# Patient Record
Sex: Female | Born: 1950 | Race: White | Hispanic: No | Smoking: Never smoker
Health system: Southern US, Community
[De-identification: ages and names within clinical notes are randomized; demographics above are authoritative.]

## PROBLEM LIST (undated history)

## (undated) DIAGNOSIS — E785 Hyperlipidemia, unspecified: Secondary | ICD-10-CM

## (undated) DIAGNOSIS — I10 Essential (primary) hypertension: Secondary | ICD-10-CM

## (undated) DIAGNOSIS — E119 Type 2 diabetes mellitus without complications: Secondary | ICD-10-CM

## (undated) DIAGNOSIS — T7840XA Allergy, unspecified, initial encounter: Secondary | ICD-10-CM

## (undated) HISTORY — DX: Type 2 diabetes mellitus without complications: E11.9

## (undated) HISTORY — DX: Allergy, unspecified, initial encounter: T78.40XA

## (undated) HISTORY — DX: Essential (primary) hypertension: I10

## (undated) HISTORY — DX: Hyperlipidemia, unspecified: E78.5

---

## 1979-08-03 HISTORY — PX: ABDOMINAL HYSTERECTOMY: SHX81

## 1979-08-03 HISTORY — PX: BLADDER SURGERY: SHX569

## 1996-08-02 HISTORY — PX: BREAST EXCISIONAL BIOPSY: SUR124

## 2000-05-18 DIAGNOSIS — E1159 Type 2 diabetes mellitus with other circulatory complications: Secondary | ICD-10-CM | POA: Insufficient documentation

## 2001-04-07 ENCOUNTER — Encounter: Admission: RE | Admit: 2001-04-07 | Discharge: 2001-04-07 | Payer: Self-pay | Admitting: Family Medicine

## 2001-04-07 ENCOUNTER — Encounter: Payer: Self-pay | Admitting: Family Medicine

## 2003-12-25 ENCOUNTER — Encounter: Admission: RE | Admit: 2003-12-25 | Discharge: 2003-12-25 | Payer: Self-pay | Admitting: Neurology

## 2005-10-06 ENCOUNTER — Encounter: Admission: RE | Admit: 2005-10-06 | Discharge: 2005-10-06 | Payer: Self-pay | Admitting: Family Medicine

## 2007-03-22 ENCOUNTER — Encounter: Admission: RE | Admit: 2007-03-22 | Discharge: 2007-03-22 | Payer: Self-pay | Admitting: Family Medicine

## 2010-09-28 ENCOUNTER — Ambulatory Visit: Payer: Self-pay | Admitting: Gastroenterology

## 2010-09-28 LAB — HM COLONOSCOPY

## 2010-09-30 LAB — PATHOLOGY REPORT

## 2012-02-08 LAB — HM MAMMOGRAPHY

## 2014-05-24 LAB — CBC AND DIFFERENTIAL
HCT: 45 % (ref 36–46)
HEMOGLOBIN: 15.5 g/dL (ref 12.0–16.0)
Platelets: 213 10*3/uL (ref 150–399)
WBC: 7.1 10^3/mL

## 2014-05-24 LAB — TSH: TSH: 4.89 u[IU]/mL (ref <=5.90)

## 2014-06-20 LAB — HM PAP SMEAR: HM Pap smear: NEGATIVE

## 2014-09-30 LAB — BASIC METABOLIC PANEL
BUN: 15 mg/dL (ref 4–21)
Creatinine: 0.8 mg/dL (ref 0.5–1.1)
GLUCOSE: 294 mg/dL
POTASSIUM: 4.1 mmol/L (ref 3.4–5.3)
SODIUM: 137 mmol/L (ref 137–147)

## 2014-09-30 LAB — LIPID PANEL
Cholesterol: 176 mg/dL (ref 0–200)
HDL: 46 mg/dL (ref 35–70)
LDL Cholesterol: 78 mg/dL
Triglycerides: 262 mg/dL — AB (ref 40–160)

## 2014-09-30 LAB — HEPATIC FUNCTION PANEL
ALT: 41 U/L — AB (ref 7–35)
AST: 23 U/L (ref 13–35)

## 2014-09-30 LAB — HEMOGLOBIN A1C: HEMOGLOBIN A1C: 10.5 % — AB (ref 4.0–6.0)

## 2015-05-05 LAB — HM DIABETES EYE EXAM

## 2015-05-07 DIAGNOSIS — E559 Vitamin D deficiency, unspecified: Secondary | ICD-10-CM | POA: Insufficient documentation

## 2015-05-07 DIAGNOSIS — E785 Hyperlipidemia, unspecified: Secondary | ICD-10-CM | POA: Insufficient documentation

## 2015-07-17 DIAGNOSIS — H029 Unspecified disorder of eyelid: Secondary | ICD-10-CM | POA: Insufficient documentation

## 2015-07-17 DIAGNOSIS — N951 Menopausal and female climacteric states: Secondary | ICD-10-CM | POA: Insufficient documentation

## 2015-07-17 DIAGNOSIS — M1990 Unspecified osteoarthritis, unspecified site: Secondary | ICD-10-CM | POA: Insufficient documentation

## 2015-07-17 DIAGNOSIS — Z6841 Body Mass Index (BMI) 40.0 and over, adult: Secondary | ICD-10-CM | POA: Insufficient documentation

## 2015-07-17 DIAGNOSIS — R229 Localized swelling, mass and lump, unspecified: Secondary | ICD-10-CM | POA: Insufficient documentation

## 2015-07-18 ENCOUNTER — Ambulatory Visit (INDEPENDENT_AMBULATORY_CARE_PROVIDER_SITE_OTHER): Payer: 59 | Admitting: Family Medicine

## 2015-07-18 ENCOUNTER — Encounter: Payer: Self-pay | Admitting: Family Medicine

## 2015-07-18 VITALS — BP 120/84 | HR 80 | Temp 97.8°F | Resp 20 | Ht 60.0 in | Wt 199.0 lb

## 2015-07-18 DIAGNOSIS — E785 Hyperlipidemia, unspecified: Secondary | ICD-10-CM

## 2015-07-18 DIAGNOSIS — I10 Essential (primary) hypertension: Secondary | ICD-10-CM | POA: Diagnosis not present

## 2015-07-18 DIAGNOSIS — E119 Type 2 diabetes mellitus without complications: Secondary | ICD-10-CM | POA: Diagnosis not present

## 2015-07-18 DIAGNOSIS — H6983 Other specified disorders of Eustachian tube, bilateral: Secondary | ICD-10-CM | POA: Diagnosis not present

## 2015-07-18 DIAGNOSIS — H6990 Unspecified Eustachian tube disorder, unspecified ear: Secondary | ICD-10-CM | POA: Insufficient documentation

## 2015-07-18 DIAGNOSIS — H6980 Other specified disorders of Eustachian tube, unspecified ear: Secondary | ICD-10-CM | POA: Insufficient documentation

## 2015-07-18 LAB — POCT GLYCOSYLATED HEMOGLOBIN (HGB A1C): Hemoglobin A1C: 6

## 2015-07-18 MED ORDER — FLUTICASONE PROPIONATE 50 MCG/ACT NA SUSP: 2.0000 | Freq: Every day | NASAL | Status: DC

## 2015-07-18 NOTE — Progress Notes (Signed)
Patient ID: Christine Clarke, female   DOB: 04/19/1951, 64 y.o.   MRN: [id number]        Patient: Christine Clarke Female    DOB: [birth year]   64 y.o.   MRN: [id number] Visit Date: 07/18/2015  Today's Provider: Lorie Phenix, MD   Chief Complaint  Patient presents with  . Diabetes  . URI   Subjective:    Diabetes She presents for her follow-up (last ov 09/30/14 A1c was 10.3% pt was started on januvia and glipizide. pt was referred to lifestyle ctr, has really been worrking really hard. ) diabetic visit. She has type 2 diabetes mellitus. Her disease course has been improving. There are no hypoglycemic associated symptoms. Pertinent negatives for hypoglycemia include no confusion, dizziness, headaches, mood changes, nervousness/anxiousness, sleepiness, speech difficulty or sweats. Pertinent negatives for diabetes include no foot paresthesias, no foot ulcerations, no polydipsia, no polyphagia, no polyuria and no visual change. There are no hypoglycemic complications. Symptoms are improving. Current diabetic treatment includes oral agent (triple therapy) and diet. She is compliant with treatment most of the time. Her weight is decreasing steadily. When asked about meal planning, she reported none. She has not had a previous visit with a dietitian. She never participates in exercise. Home blood sugar record trend: Does not monitor.   She does not see a podiatrist.Eye exam is current (2 months ago [facility]).  URI  This is a new problem. The current episode started in the past 7 days. The problem has been unchanged (Gets so stopped up she can not breath. ). There has been no fever. Associated symptoms include congestion, ear pain, a plugged ear sensation and sinus pain. Pertinent negatives include no headaches. She has tried nothing for the symptoms. The treatment provided mild relief.   Results for orders placed or performed in visit on 07/18/15  POCT glycosylated hemoglobin (Hb A1C)  Result Value Ref Range     Hemoglobin A1C 6.0        No Known Allergies Previous Medications   AMLODIPINE (NORVASC) 5 MG TABLET    Take 5 mg by mouth daily.    FLUTICASONE (FLONASE) 50 MCG/ACT NASAL SPRAY    Place 1 spray into the nose daily.    GLIPIZIDE (GLUCOTROL XL) 2.5 MG 24 HR TABLET    Take 2.5 mg by mouth daily with breakfast.    LORATADINE (CLARITIN) 10 MG TABLET    Take 10 mg by mouth daily as needed.    MELOXICAM (MOBIC) 15 MG TABLET    Take 15 mg by mouth daily.    METFORMIN (GLUCOPHAGE) 500 MG TABLET    Take 500 mg by mouth daily with breakfast.    ONE TOUCH ULTRA TEST TEST STRIP       QUINAPRIL-HYDROCHLOROTHIAZIDE (ACCURETIC) 20-25 MG TABLET    Take 1 tablet by mouth daily.    SIMVASTATIN (ZOCOR) 10 MG TABLET    Take 10 mg by mouth daily at 6 PM.    SITAGLIPTIN (JANUVIA) 100 MG TABLET    Take 100 mg by mouth daily.     Review of Systems  Constitutional: Negative.   HENT: Positive for congestion and ear pain.   Respiratory: Negative.   Cardiovascular: Negative.   Endocrine: Negative for polydipsia, polyphagia and polyuria.  Genitourinary: Positive for urgency.  Musculoskeletal: Positive for myalgias.  Neurological: Negative for dizziness, speech difficulty and headaches.  Psychiatric/Behavioral: Negative for confusion. The patient is not nervous/anxious.     Social History  Substance Use Topics  .  Smoking status: Never Smoker   . Smokeless tobacco: Never Used  . Alcohol Use: Yes     Comment: occasional   Objective:   BP 120/84 mmHg  Pulse 80  Temp(Src) 97.8 F (36.6 C) (Oral)  Resp 20  Ht 5' (1.524 m)  Wt 199 lb (90.266 kg)  BMI 38.86 kg/m2  SpO2 97%  LMP  (Approximate)  Physical Exam  Constitutional: She is oriented to person, place, and time. She appears well-developed and well-nourished.  HENT:  Head: Normocephalic and atraumatic.  Right Ear: External ear normal.  Left Ear: External ear normal.  Mouth/Throat: Oropharynx is clear and moist.  Eyes: Conjunctivae and EOM  are normal. Pupils are equal, round, and reactive to light.  Neck: Normal range of motion. Neck supple.  Cardiovascular: Normal rate and regular rhythm.   Pulmonary/Chest: Effort normal and breath sounds normal.  Neurological: She is alert and oriented to person, place, and time.  Psychiatric: She has a normal mood and affect. Her behavior is normal. Judgment and thought content normal.      Assessment & Plan:     1. Diabetes mellitus without complication (HCC) Greatly improved. Work with trainer in new year. Stop Glipizide. Recheck in 4 months.  - POCT glycosylated hemoglobin (Hb A1C)  2. Eustachian tube dysfunction, bilateral New problem. Restart Flonase. Patient instructed to call back if condition worsens or does not improve.    - fluticasone (FLONASE) 50 MCG/ACT nasal spray; Place 2 sprays into both nostrils daily.  Dispense: 16 g; Refill: 6  3. Essential (primary) hypertension Stable. Recheck in 4 months.   4. HLD (hyperlipidemia) Stable. Continue medication. Labs due in February.       Lorie PhenixNancy Ercil Cassis, MD  [facility]

## 2015-07-23 ENCOUNTER — Telehealth: Payer: Self-pay | Admitting: Family Medicine

## 2015-07-23 MED ORDER — AMOXICILLIN-POT CLAVULANATE 875-125 MG PO TABS: 1.0000 | ORAL_TABLET | Freq: Two times a day (BID) | ORAL | Status: DC

## 2015-07-23 NOTE — Telephone Encounter (Signed)
Please see if would like to try prednisone or does she feel more like she has an infection and feels like she needs antibiotic. Thanks.

## 2015-07-23 NOTE — Telephone Encounter (Signed)
Pt was seen for OV on 07/18/15. Pt stated her ear hasn't improved since her visit and [facility] [address] Thanks TNP

## 2015-07-23 NOTE — Telephone Encounter (Signed)
Patient states based off her symptoms she thinks she needs a antibiotic.

## 2015-08-08 ENCOUNTER — Other Ambulatory Visit: Payer: Self-pay | Admitting: Family Medicine

## 2015-08-08 DIAGNOSIS — I10 Essential (primary) hypertension: Secondary | ICD-10-CM

## 2015-08-08 DIAGNOSIS — J302 Other seasonal allergic rhinitis: Secondary | ICD-10-CM

## 2015-08-08 DIAGNOSIS — E785 Hyperlipidemia, unspecified: Secondary | ICD-10-CM

## 2015-08-25 ENCOUNTER — Ambulatory Visit (INDEPENDENT_AMBULATORY_CARE_PROVIDER_SITE_OTHER): Payer: 59 | Admitting: Physician Assistant

## 2015-08-25 ENCOUNTER — Encounter: Payer: Self-pay | Admitting: Physician Assistant

## 2015-08-25 VITALS — BP 150/78 | HR 91 | Temp 98.4°F | Resp 16 | Wt 195.0 lb

## 2015-08-25 DIAGNOSIS — M6248 Contracture of muscle, other site: Secondary | ICD-10-CM | POA: Diagnosis not present

## 2015-08-25 DIAGNOSIS — E119 Type 2 diabetes mellitus without complications: Secondary | ICD-10-CM | POA: Diagnosis not present

## 2015-08-25 DIAGNOSIS — M62830 Muscle spasm of back: Secondary | ICD-10-CM | POA: Diagnosis not present

## 2015-08-25 DIAGNOSIS — M62838 Other muscle spasm: Secondary | ICD-10-CM

## 2015-08-25 MED ORDER — CYCLOBENZAPRINE HCL 10 MG PO TABS: 10.0000 mg | ORAL_TABLET | Freq: Every day | ORAL | Status: DC

## 2015-08-25 MED ORDER — ONETOUCH LANCETS MISC: Status: DC
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
## 2015-08-25 NOTE — Patient Instructions (Signed)

## 2015-09-08 ENCOUNTER — Telehealth: Payer: Self-pay | Admitting: Family Medicine

## 2015-09-08 NOTE — Telephone Encounter (Signed)
Informed pt as below. Pt verbalizes understanding. Christine Clarke, CMA  

## 2015-09-08 NOTE — Telephone Encounter (Signed)
Glipizide and should have follow up in March. Thanks.

## 2015-09-08 NOTE — Telephone Encounter (Signed)
Pt called and wants to know which diabetes medication she was taken off of.  She wants to make sure which medication she is suppose to be take.  Please leave a message if she does not answer the phone.  Her call back is [phone]  Thanks Barth Kirks

## 2015-09-08 NOTE — Telephone Encounter (Signed)
Per 07/18/15 OV note, looks like we told patient to D/C Glipizide. Please check to make sure I'm telling her the correct medication. Thanks!

## 2015-10-13 ENCOUNTER — Other Ambulatory Visit: Payer: Self-pay | Admitting: Physician Assistant

## 2015-11-11 ENCOUNTER — Encounter: Payer: Self-pay | Admitting: Physician Assistant

## 2015-11-11 ENCOUNTER — Ambulatory Visit (INDEPENDENT_AMBULATORY_CARE_PROVIDER_SITE_OTHER): Payer: 59 | Admitting: Physician Assistant

## 2015-11-11 VITALS — BP 150/100 | HR 80 | Temp 98.2°F | Resp 16 | Wt 194.2 lb

## 2015-11-11 DIAGNOSIS — N3001 Acute cystitis with hematuria: Secondary | ICD-10-CM | POA: Diagnosis not present

## 2015-11-11 DIAGNOSIS — R300 Dysuria: Secondary | ICD-10-CM

## 2015-11-11 DIAGNOSIS — R1012 Left upper quadrant pain: Secondary | ICD-10-CM

## 2015-11-11 LAB — POCT URINALYSIS DIPSTICK
BILIRUBIN UA: NEGATIVE
GLUCOSE UA: NEGATIVE
Ketones, UA: NEGATIVE
Nitrite, UA: NEGATIVE
Protein, UA: 100
SPEC GRAV UA: 1.025
UROBILINOGEN UA: 0.2
pH, UA: 6

## 2015-11-11 MED ORDER — SULFAMETHOXAZOLE-TRIMETHOPRIM 800-160 MG PO TABS: 1.0000 | ORAL_TABLET | Freq: Two times a day (BID) | ORAL | Status: DC

## 2015-11-11 MED ORDER — PHENAZOPYRIDINE HCL 200 MG PO TABS: 200.0000 mg | ORAL_TABLET | Freq: Three times a day (TID) | ORAL | Status: DC | PRN

## 2015-11-11 NOTE — Progress Notes (Signed)
Patient: Christine BlazingCarol C Clarke Female    DOB: 1950/08/21   65 y.o.   MRN: [id number] Visit Date: 11/11/2015  Today's Provider: Margaretann LovelessJennifer M Jackeline Gutknecht, PA-C   Chief Complaint  Patient presents with  . Urinary Tract Infection   Subjective:    Urinary Tract Infection  This is a new problem. The current episode started in the past 7 days (Started on Sunday after having intercourse). The problem occurs every urination. The problem has been gradually worsening (On Sunday the urine was bloody and on Monday she was better after drinking Cranberry Juice). The quality of the pain is described as aching (Painful). The pain is at a severity of 8/10. There has been no fever. She is sexually active. Associated symptoms include frequency, hematuria and urgency. Pertinent negatives include no chills, discharge, flank pain, nausea, possible pregnancy or vomiting. Treatments tried: Cranberry Juice. The treatment provided no relief. There is no history of kidney stones or recurrent UTIs.   She also complains of some new LUQ pain that has been occurring over the last 2-3 weeks. It has only happened 3 times. She states it is a sharp, stabbing pain that last less than 1-2 seconds and is gone. She has not noticed a rash and has had the shingles vaccine. She also has not correlated the pain to food. Denies constipation or diarrhea, nausea or vomiting. No h/o GERD.    No Known Allergies Previous Medications   AMLODIPINE (NORVASC) 5 MG TABLET    Take 5 mg by mouth daily.    CYCLOBENZAPRINE (FLEXERIL) 10 MG TABLET    Take 1 tablet (10 mg total) by mouth at bedtime.   FLUTICASONE (FLONASE) 50 MCG/ACT NASAL SPRAY    Place 1 spray into the nose daily.    LORATADINE (CLARITIN) 10 MG TABLET    Take 10 mg by mouth daily as needed.    MELOXICAM (MOBIC) 15 MG TABLET    Take 15 mg by mouth daily.    METFORMIN (GLUCOPHAGE) 500 MG TABLET    Take 1 tablet by mouth two  times daily   MONTELUKAST (SINGULAIR) 10 MG TABLET    Take 1  tablet by mouth  daily   ONE TOUCH ULTRA TEST TEST STRIP       ONETOUCH DELICA LANCETS 33G MISC    Check blood sugar before  meals and at bedtime   QUINAPRIL-HYDROCHLOROTHIAZIDE (ACCURETIC) 20-25 MG TABLET    Take 1 tablet by mouth daily.    SIMVASTATIN (ZOCOR) 10 MG TABLET    Take 1 tablet by mouth  daily   SITAGLIPTIN (JANUVIA) 100 MG TABLET    Take 100 mg by mouth daily.     Review of Systems  Constitutional: Negative.  Negative for fever and chills.  HENT: Negative.   Respiratory: Negative.   Cardiovascular: Negative.   Gastrointestinal: Positive for abdominal pain (LUQ; stabbing pain off and on for the last two weeks). Negative for nausea, vomiting, diarrhea, constipation and blood in stool.  Genitourinary: Positive for dysuria, urgency, frequency and hematuria. Negative for flank pain, decreased urine volume, vaginal bleeding, vaginal discharge and vaginal pain.  Musculoskeletal: Negative for back pain.  Skin: Negative for rash.    Social History  Substance Use Topics  . Smoking status: Never Smoker   . Smokeless tobacco: Never Used  . Alcohol Use: Yes     Comment: occasional   Objective:   BP 150/100 mmHg  Pulse 80  Temp(Src) 98.2 F (36.8 C) (Oral)  Resp 16  Wt 194 lb 3.2 oz (88.089 kg)  LMP  (Approximate)  Physical Exam  Constitutional: She is oriented to person, place, and time. She appears well-developed and well-nourished. No distress.  Cardiovascular: Normal rate, regular rhythm and normal heart sounds.  Exam reveals no gallop and no friction rub.   No murmur heard. Pulmonary/Chest: Effort normal and breath sounds normal. No respiratory distress. She has no wheezes. She has no rales.  Abdominal: Soft. Normal appearance and bowel sounds are normal. She exhibits no distension and no mass. There is no hepatosplenomegaly. There is tenderness in the suprapubic area. There is no rebound, no guarding and no CVA tenderness.  Suprapubic pressure  Neurological: She is  alert and oriented to person, place, and time.  Skin: Skin is warm and dry. She is not diaphoretic.        Assessment & Plan:     1. Acute cystitis with hematuria Will treat empirically with Bactrim as below. Pyridium given for dysuria. Will send urine for culture. Will adjust antibiotic treatment as necessary pending C&S results. She needs to stay well hydrated. She is to call if symptoms fail to improve or worsen.  - sulfamethoxazole-trimethoprim (BACTRIM DS,SEPTRA DS) 800-160 MG tablet; Take 1 tablet by mouth 2 (two) times daily.  Dispense: 20 tablet; Refill: 0 - phenazopyridine (PYRIDIUM) 200 MG tablet; Take 1 tablet (200 mg total) by mouth 3 (three) times daily as needed for pain.  Dispense: 30 tablet; Refill: 0  2. Dysuria See above medical treatment plan. - Urine culture - POCT urinalysis dipstick - phenazopyridine (PYRIDIUM) 200 MG tablet; Take 1 tablet (200 mg total) by mouth 3 (three) times daily as needed for pain.  Dispense: 30 tablet; Refill: 0 fill: 0  3. LUQ pain She is going to monitor and try to see if any correlation to food or bowel movements. It has only occurred 3 times thus far over the last 2 weeks. She will call if symptoms continue or worsen.         Margaretann Loveless, PA-C  [facility]

## 2015-11-11 NOTE — Patient Instructions (Signed)

## 2015-11-13 LAB — URINE CULTURE

## 2015-11-17 ENCOUNTER — Ambulatory Visit: Payer: 59 | Admitting: Family Medicine

## 2015-11-18 ENCOUNTER — Encounter: Payer: Self-pay | Admitting: Family Medicine

## 2015-11-18 ENCOUNTER — Ambulatory Visit (INDEPENDENT_AMBULATORY_CARE_PROVIDER_SITE_OTHER): Payer: 59 | Admitting: Family Medicine

## 2015-11-18 VITALS — BP 128/80 | HR 100 | Temp 97.9°F | Resp 16 | Ht 59.5 in | Wt 195.0 lb

## 2015-11-18 DIAGNOSIS — Z6838 Body mass index (BMI) 38.0-38.9, adult: Secondary | ICD-10-CM

## 2015-11-18 DIAGNOSIS — E119 Type 2 diabetes mellitus without complications: Secondary | ICD-10-CM | POA: Diagnosis not present

## 2015-11-18 DIAGNOSIS — I10 Essential (primary) hypertension: Secondary | ICD-10-CM | POA: Diagnosis not present

## 2015-11-18 DIAGNOSIS — E039 Hypothyroidism, unspecified: Secondary | ICD-10-CM | POA: Diagnosis not present

## 2015-11-18 DIAGNOSIS — Z1231 Encounter for screening mammogram for malignant neoplasm of breast: Secondary | ICD-10-CM | POA: Diagnosis not present

## 2015-11-18 DIAGNOSIS — E785 Hyperlipidemia, unspecified: Secondary | ICD-10-CM | POA: Diagnosis not present

## 2015-11-18 DIAGNOSIS — Z23 Encounter for immunization: Secondary | ICD-10-CM | POA: Diagnosis not present

## 2015-11-18 LAB — POCT GLYCOSYLATED HEMOGLOBIN (HGB A1C)
Est. average glucose Bld gHb Est-mCnc: 128
HEMOGLOBIN A1C: 6.1

## 2015-11-18 LAB — POCT UA - MICROALBUMIN: Microalbumin Ur, POC: NEGATIVE mg/L
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
## 2015-11-26 ENCOUNTER — Telehealth: Payer: Self-pay | Admitting: Family Medicine

## 2015-11-26 DIAGNOSIS — Z1231 Encounter for screening mammogram for malignant neoplasm of breast: Secondary | ICD-10-CM

## 2015-11-26 NOTE — Addendum Note (Signed)
Addended by: Kavin LeechWALSH, Alayne Estrella E on: 11/26/2015 04:42 PM   Modules accepted: Orders

## 2015-11-26 NOTE — Telephone Encounter (Signed)
Please change order. Thanks.

## 2015-11-26 NOTE — Telephone Encounter (Signed)
Order fixed.   Thanks,   -Vernona RiegerLaura

## 2015-11-26 NOTE — Telephone Encounter (Signed)
[facility] but diagnosis list is for screening.Did you want to order this as a diagnostic ?

## 2015-11-27 ENCOUNTER — Other Ambulatory Visit: Payer: Self-pay | Admitting: Family Medicine

## 2015-11-27 DIAGNOSIS — E119 Type 2 diabetes mellitus without complications: Secondary | ICD-10-CM

## 2015-11-27 DIAGNOSIS — I10 Essential (primary) hypertension: Secondary | ICD-10-CM

## 2015-11-27 DIAGNOSIS — M1990 Unspecified osteoarthritis, unspecified site: Secondary | ICD-10-CM

## 2015-12-09 ENCOUNTER — Ambulatory Visit (INDEPENDENT_AMBULATORY_CARE_PROVIDER_SITE_OTHER): Payer: 59 | Admitting: Family Medicine

## 2015-12-09 ENCOUNTER — Encounter: Payer: Self-pay | Admitting: Family Medicine

## 2015-12-09 VITALS — BP 140/90 | HR 80 | Temp 97.7°F | Resp 16 | Ht 59.0 in | Wt 197.0 lb

## 2015-12-09 DIAGNOSIS — Z0000 Encounter for general adult medical examination without abnormal findings: Secondary | ICD-10-CM | POA: Diagnosis not present

## 2015-12-09 DIAGNOSIS — M159 Polyosteoarthritis, unspecified: Secondary | ICD-10-CM

## 2015-12-09 DIAGNOSIS — R319 Hematuria, unspecified: Secondary | ICD-10-CM | POA: Diagnosis not present

## 2015-12-09 DIAGNOSIS — E119 Type 2 diabetes mellitus without complications: Secondary | ICD-10-CM

## 2015-12-09 DIAGNOSIS — R0789 Other chest pain: Secondary | ICD-10-CM | POA: Diagnosis not present

## 2015-12-09 DIAGNOSIS — Z1211 Encounter for screening for malignant neoplasm of colon: Secondary | ICD-10-CM

## 2015-12-09 DIAGNOSIS — Z124 Encounter for screening for malignant neoplasm of cervix: Secondary | ICD-10-CM

## 2015-12-09 DIAGNOSIS — Z1239 Encounter for other screening for malignant neoplasm of breast: Secondary | ICD-10-CM

## 2015-12-09 DIAGNOSIS — M150 Primary generalized (osteo)arthritis: Secondary | ICD-10-CM | POA: Diagnosis not present

## 2015-12-09 LAB — IFOBT (OCCULT BLOOD): IMMUNOLOGICAL FECAL OCCULT BLOOD TEST: NEGATIVE

## 2015-12-09 LAB — POCT URINALYSIS DIPSTICK
BILIRUBIN UA: NEGATIVE
Glucose, UA: NEGATIVE
Ketones, UA: NEGATIVE
LEUKOCYTES UA: NEGATIVE
NITRITE UA: NEGATIVE
PH UA: 5
Spec Grav, UA: 1.025
UROBILINOGEN UA: 0.2

## 2015-12-09 MED ORDER — METFORMIN HCL 500 MG PO TABS: ORAL_TABLET | ORAL | Status: DC

## 2015-12-09 NOTE — Progress Notes (Signed)
Patient ID: Christine Clarke, female   DOB: [birth year], 65 y.o.   MRN: [id number]       Patient: Christine BlazingCarol C Clarke, Female    DOB: [birth year], 65 y.o.   MRN: [id number] Visit Date: 12/09/2015  Today's Provider: Lorie PhenixNancy Hevin Jeffcoat, MD   Chief Complaint  Patient presents with  . Annual Exam   Subjective:    Annual physical exam Christine Clarke is a 65 y.o. female who presents today for health maintenance and complete physical. She feels fairly well. She reports exercising none. She reports she is sleeping well. 06/20/14 CPE 06/20/14 Pap-neg; HPV-neg 02/08/12 Mammogram-BI-RADS 1 09/28/10 Colonoscopy-polyp -----------------------------------------------------------------  Knee pain: Patient reports "cramps" in her knees. Patient reports symptoms have been present for a few weeks. Patient reports symptoms are present more in the morning. Patient denies injury to knees.   Upper abdominal pain: Patient reports that she has been having sharp pain in upper left abdominal pain. Patient reports that pain only last for a few seconds. Patient denies nausea, vomiting or diarrhea. Patient reports pain is present at least once a week. Patient has not identified a trigger.   Review of Systems  Constitutional: Positive for fatigue.  HENT: Positive for drooling and sinus pressure.   Eyes: Negative.   Respiratory: Negative.   Cardiovascular: Negative.   Gastrointestinal: Negative.   Endocrine: Negative.   Genitourinary: Negative.   Musculoskeletal: Positive for arthralgias.  Skin: Negative.   Allergic/Immunologic: Positive for environmental allergies.  Neurological: Negative.   Hematological: Negative.   Psychiatric/Behavioral: Negative.     Social History      She  reports that she has never smoked. She has never used smokeless tobacco. She reports that she drinks alcohol. She reports that she does not use illicit drugs.       Social History   Social History  . Marital Status: Divorced    Spouse  Name: N/A  . Number of Children: N/A  . Years of Education: N/A   Social History Main Topics  . Smoking status: Never Smoker   . Smokeless tobacco: Never Used  . Alcohol Use: Yes     Comment: occasional  . Drug Use: No  . Sexual Activity: Not Asked   Other Topics Concern  . None   Social History Narrative    Past Medical History  Diagnosis Date  . Diabetes mellitus without complication (HCC)   . Allergy   . Hypertension   . Hyperlipidemia      Patient Active Problem List   Diagnosis Date Noted  . Encounter for screening mammogram for breast cancer 11/26/2015  . Diabetes mellitus without complication (HCC) 07/18/2015  . Eustachian tube dysfunction 07/18/2015  . BMI 38.0-38.9,adult 07/17/2015  . Hot flash, menopausal 07/17/2015  . Disease of eyelid 07/17/2015  . Arthritis, degenerative 07/17/2015  . Skin nodule 07/17/2015  . Avitaminosis D 05/07/2015  . HLD (hyperlipidemia) 05/07/2015  . Allergic rhinitis, seasonal 03/19/2009  . Hypothyroidism 03/19/2009  . LBP (low back pain) 02/24/2009  . 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 Essential (primary) hypertension 05/18/2000    Past Surgical History  Procedure Laterality Date  . Bladder surgery  1981  . Abdominal hysterectomy  1981    partial    Family History        Family Status  Relation Status Death Age  . Mother Deceased 40's    stroke  . Father Deceased 4660's    pneumonia        Her family history includes  Hypertension in her mother.    No Known Allergies  Previous Medications   AMLODIPINE (NORVASC) 5 MG TABLET    Take 1 tablet by mouth  every day   CYCLOBENZAPRINE (FLEXERIL) 10 MG TABLET    Take 1 tablet (10 mg total) by mouth at bedtime.   FLUTICASONE (FLONASE) 50 MCG/ACT NASAL SPRAY    Place 1 spray into the nose daily.    JANUVIA 100 MG TABLET    Take 1 tablet by mouth  daily   LORATADINE (CLARITIN) 10 MG TABLET    Take 10 mg by mouth daily as needed.    MELOXICAM (MOBIC) 15 MG  TABLET    Take 1 tablet by mouth  every day   METFORMIN (GLUCOPHAGE) 500 MG TABLET    Take 1 tablet by mouth two  times daily   MONTELUKAST (SINGULAIR) 10 MG TABLET    Take 1 tablet by mouth  daily   ONE TOUCH ULTRA TEST TEST STRIP       ONETOUCH DELICA LANCETS 33G MISC    Check blood sugar before  meals and at bedtime   QUINAPRIL-HYDROCHLOROTHIAZIDE (ACCURETIC) 20-25 MG TABLET    Take 1 tablet by mouth daily.    SIMVASTATIN (ZOCOR) 10 MG TABLET    Take 1 tablet by mouth  daily    Patient Care Team: Lorie Phenix, MD as PCP - General (Family Medicine)     Objective:   Vitals: BP 140/90 mmHg  Pulse 80  Temp(Src) 97.7 F (36.5 C) (Oral)  Resp 16  Ht 4\' 11"  (1.499 m)  Wt 197 lb (89.359 kg)  BMI 39.77 kg/m2  LMP  (LMP Unknown)   Physical Exam  Constitutional: She is oriented to person, place, and time. She appears well-developed and well-nourished.  HENT:  Head: Normocephalic and atraumatic.  Right Ear: Tympanic membrane, external ear and ear canal normal.  Left Ear: Tympanic membrane, external ear and ear canal normal.  Nose: Nose normal.  Mouth/Throat: Uvula is midline, oropharynx is clear and moist and mucous membranes are normal.  Eyes: Conjunctivae, EOM and lids are normal. Pupils are equal, round, and reactive to light.  Neck: Trachea normal and normal range of motion. Neck supple. Carotid bruit is not present. No thyroid mass and no thyromegaly present.  Cardiovascular: Normal rate, regular rhythm and normal heart sounds.   Pulmonary/Chest: Effort normal and breath sounds normal.  Bruise on left breast  Abdominal: Soft. Normal appearance and bowel sounds are normal. There is no hepatosplenomegaly. There is no tenderness.  Genitourinary: Vagina normal. No breast swelling, tenderness or discharge.  Bi-manual normal. Hysterectomy- no cervix.   Musculoskeletal: Normal range of motion.  Lymphadenopathy:    She has no cervical adenopathy.    She has no axillary adenopathy.    Neurological: She is alert and oriented to person, place, and time. She has normal strength. No cranial nerve deficit.  Skin: Skin is warm, dry and intact.  Psychiatric: She has a normal mood and affect. Her speech is normal and behavior is normal. Judgment and thought content normal. Cognition and memory are normal.    Depression Screen PHQ 2/9 Scores 12/09/2015  PHQ - 2 Score 0    Assessment & Plan:     Routine Health Maintenance and Physical Exam  Exercise Activities and Dietary recommendations Goals    None      Immunization History  Administered Date(s) Administered  . Pneumococcal Conjugate-13 06/20/2014  . Pneumococcal Polysaccharide-23 11/18/2015  . Tdap 01/28/2012  .  Zoster 01/28/2012      1. Annual physical exam Stable. Patient advised to continue eating healthy and exercise daily.  Check labs printed out at last ov.   - POCT urinalysis dipstick  2. Breast cancer screening - MM DIGITAL SCREENING BILATERAL; Future  3. Cervical cancer screening F/U pending lab report. - Pap IG and HPV (high risk) DNA detection  4. Colon cancer screening Negative. - IFOBT POC (occult bld, rslt in office)  5. Other chest pain Suspect costochondritis. Call if worsens or does improve.   EKG normal.   Recurrent.  - EKG 12-Lead  6. Hematuria F/U pending lab report. - Urine Microscopic  7. Primary osteoarthritis involving multiple joints Call if worsens or does not improve.  May need referral.    8. Diabetes mellitus without complication (HCC) Increase Metformin to BID.   Patient seen and examined by Dr. Leo Grosser, and note scribed by Liz Beach. Dimas, CMA.  I have reviewed the document for accuracy and completeness and I agree with above. Leo Grosser, MD   Lorie Phenix, MD   --------------------------------------------------------------------

## 2015-12-10 LAB — URINALYSIS, MICROSCOPIC ONLY
Bacteria, UA: NONE SEEN
CASTS: NONE SEEN /LPF

## 2015-12-11 ENCOUNTER — Telehealth: Payer: Self-pay

## 2015-12-11 LAB — PAP IG AND HPV HIGH-RISK
HPV, HIGH-RISK: NEGATIVE
PAP SMEAR COMMENT: 0

## 2015-12-11 NOTE — Telephone Encounter (Signed)
-----   Message from Lorie PhenixNancy Maloney, MD sent at 12/11/2015  5:02 PM EDT ----- Pap is normal. Please notify patient.   Thanks.

## 2015-12-11 NOTE — Telephone Encounter (Signed)
LMTCB 12/11/2015  Thanks,   -Jerusalen Mateja  

## 2015-12-15 NOTE — Telephone Encounter (Signed)
LMTCB 12/15/2015 Allene DillonEmily Drozdowski, CMA

## 2015-12-15 NOTE — Telephone Encounter (Signed)
Advised patient of results.  

## 2016-02-24 ENCOUNTER — Ambulatory Visit (INDEPENDENT_AMBULATORY_CARE_PROVIDER_SITE_OTHER): Payer: 59 | Admitting: Physician Assistant

## 2016-02-24 ENCOUNTER — Encounter: Payer: Self-pay | Admitting: Physician Assistant

## 2016-02-24 VITALS — BP 140/80 | HR 80 | Temp 98.2°F | Resp 16 | Wt 203.6 lb

## 2016-02-24 DIAGNOSIS — I10 Essential (primary) hypertension: Secondary | ICD-10-CM | POA: Diagnosis not present

## 2016-02-24 DIAGNOSIS — R1012 Left upper quadrant pain: Secondary | ICD-10-CM

## 2016-02-24 DIAGNOSIS — R5382 Chronic fatigue, unspecified: Secondary | ICD-10-CM

## 2016-02-24 DIAGNOSIS — R300 Dysuria: Secondary | ICD-10-CM | POA: Diagnosis not present

## 2016-02-24 DIAGNOSIS — Z1239 Encounter for other screening for malignant neoplasm of breast: Secondary | ICD-10-CM | POA: Diagnosis not present

## 2016-02-24 DIAGNOSIS — N3001 Acute cystitis with hematuria: Secondary | ICD-10-CM

## 2016-02-24 DIAGNOSIS — E119 Type 2 diabetes mellitus without complications: Secondary | ICD-10-CM | POA: Diagnosis not present

## 2016-02-24 DIAGNOSIS — E039 Hypothyroidism, unspecified: Secondary | ICD-10-CM

## 2016-02-24 LAB — POCT URINALYSIS DIPSTICK
Bilirubin, UA: NEGATIVE
Glucose, UA: NEGATIVE
KETONES UA: NEGATIVE
LEUKOCYTES UA: NEGATIVE
NITRITE UA: NEGATIVE
PROTEIN UA: NEGATIVE
Spec Grav, UA: 1.02
UROBILINOGEN UA: 0.2
pH, UA: 6

## 2016-02-24 MED ORDER — METFORMIN HCL 500 MG PO TABS: 1000.0000 mg | ORAL_TABLET | Freq: Every day | ORAL | 3 refills | Status: DC

## 2016-02-24 MED ORDER — ONETOUCH ULTRA BLUE VI STRP: ORAL_STRIP | 3 refills | Status: DC

## 2016-02-24 MED ORDER — QUINAPRIL-HYDROCHLOROTHIAZIDE 20-25 MG PO TABS: 1.0000 | ORAL_TABLET | Freq: Every day | ORAL | 1 refills | Status: DC

## 2016-02-24 NOTE — Patient Instructions (Signed)

## 2016-02-24 NOTE — Progress Notes (Signed)
Patient: Christine Clarke Female    DOB: [birth year]   65 y.o.   MRN: [id number] Visit Date: 02/24/2016  Today's Provider: Margaretann Loveless, PA-C   Chief Complaint  Patient presents with  . Abdominal Pain   Subjective:    Abdominal Pain  This is a new problem. The current episode started 1 to 4 weeks ago (3 weeks ago). The problem occurs daily. The problem has been waxing and waning. The pain is located in the LUQ. The quality of the pain is aching (she feels like there's knot ). The abdominal pain radiates to the RLQ. Associated symptoms include dysuria. Pertinent negatives include no constipation, diarrhea, fever, frequency, headaches or hematuria. Associated symptoms comments: Back pain in the last three days. Side pain. Nothing (with emotion) aggravates the pain. The pain is relieved by nothing. She has tried nothing for the symptoms.   She also reports that she feels like water is in both of her ears. She reports that she had vertigo last week.  She is also complaining of some dysuria and foul smell to her urine with some mild back pain for the last couple of days also.     No Known Allergies Current Meds  Medication Sig  . amLODipine (NORVASC) 5 MG tablet Take 1 tablet by mouth  every day  . fluticasone (FLONASE) 50 MCG/ACT nasal spray Place 1 spray into the nose daily.   Marland Kitchen JANUVIA 100 MG tablet Take 1 tablet by mouth  daily  . meloxicam (MOBIC) 15 MG tablet Take 1 tablet by mouth  every day  . montelukast (SINGULAIR) 10 MG tablet Take 1 tablet by mouth  daily  . ONE TOUCH ULTRA TEST test strip   . ONETOUCH DELICA LANCETS 33G MISC Check blood sugar before  meals and at bedtime  . quinapril-hydrochlorothiazide (ACCURETIC) 20-25 MG tablet Take 1 tablet by mouth daily.   . simvastatin (ZOCOR) 10 MG tablet Take 1 tablet by mouth  daily    Review of Systems  Constitutional: Negative for chills, fatigue and fever.  Respiratory: Negative for cough, chest tightness and  shortness of breath.   Cardiovascular: Positive for chest pain (muscular at lower left chest/upper left quadrant of abdomen). Negative for palpitations and leg swelling.  Gastrointestinal: Positive for abdominal pain. Negative for constipation and diarrhea.  Genitourinary: Positive for dysuria. Negative for decreased urine volume, flank pain, frequency, hematuria, pelvic pain, urgency, vaginal bleeding, vaginal discharge and vaginal pain.  Musculoskeletal: Positive for back pain.  Neurological: Positive for light-headedness (last week; none now). Negative for dizziness and headaches.  Psychiatric/Behavioral: The patient is nervous/anxious.     Social History  Substance Use Topics  . Smoking status: Never Smoker  . Smokeless tobacco: Never Used  . Alcohol use Yes     Comment: occasional   Objective:   BP 140/80 (BP Location: Left Arm, Patient Position: Sitting, Cuff Size: Normal)   Temp 98.2 F (36.8 C) (Oral)   Resp 16   Wt 203 lb 9.6 oz (92.4 kg)   LMP  (LMP Unknown)   BMI 41.12 kg/m   Physical Exam  Constitutional: She is oriented to person, place, and time. She appears well-developed and well-nourished. No distress.  HENT:  Head: Normocephalic and atraumatic.  Right Ear: Hearing, tympanic membrane, external ear and ear canal normal.  Left Ear: Hearing, tympanic membrane, external ear and ear canal normal.  Nose: Nose normal.  Mouth/Throat: Uvula is midline, oropharynx is clear and moist  and mucous membranes are normal. No oropharyngeal exudate.  Eyes: Conjunctivae are normal. Pupils are equal, round, and reactive to light. Right eye exhibits no discharge. Left eye exhibits no discharge. No scleral icterus.  Neck: Normal range of motion. Neck supple. No tracheal deviation present. No thyromegaly present.  Cardiovascular: Normal rate, regular rhythm and normal heart sounds.  Exam reveals no gallop and no friction rub.   No murmur heard. Pulmonary/Chest: Effort normal and breath  sounds normal. No stridor. No respiratory distress. She has no wheezes. She has no rales.  Abdominal: Soft. Normal appearance and bowel sounds are normal. She exhibits no distension and no mass. There is no hepatosplenomegaly. There is tenderness in the left upper quadrant. There is no rebound, no guarding and no CVA tenderness.    Lymphadenopathy:    She has no cervical adenopathy.  Neurological: She is alert and oriented to person, place, and time.  Skin: Skin is warm and dry. She is not diaphoretic.  Vitals reviewed.     Assessment & Plan:     1. Dysuria UA was positive for some hematuria but no leukocytes. I will send for culture due to her dysuria and odor that was noted. Will f/u pending results. - POCT Urinalysis Dipstick - Urine Culture  2. Essential hypertension Will check labs as below and f/u pending results. Medication refilled as below. - quinapril-hydrochlorothiazide (ACCURETIC) 20-25 MG tablet; Take 1 tablet by mouth daily.  Dispense: 90 tablet; Refill: 1 - Comprehensive Metabolic Panel (CMET) - CBC with Differential - Lipid Profile  3. Type 2 diabetes mellitus without complication, without long-term current use of insulin (HCC) She has only been taking [place] of recent. Will add metformin back as below and check labs. Will f/u in 3 months.  - metFORMIN (GLUCOPHAGE) 500 MG tablet; Take 2 tablets (1,000 mg total) by mouth daily with breakfast.  Dispense: 180 tablet; Refill: 3 - ONE TOUCH ULTRA TEST test strip; Check blood sugar ACHS  Dispense: 100 each; Refill: 3 - CBC with Differential - Lipid Profile  4. Breast cancer screening Mammogram was not scheduled at recent CPE in 12/2015. Ordered as below. Patient uses [employer]. - MM Digital Screening; Future  5. Left upper quadrant pain Unknown source. Question possible gastritis due to chronic meloxicam use. Will have her discontinue meloxicam. I will await lab results and f/u pending results. If no change in  pain may consider imaging to work up further.  - Lipase  6. Chronic fatigue Will check labs as below and f/u pending results. - TSH       Margaretann Loveless, PA-C  [facility]

## 2016-02-26 LAB — URINE CULTURE

## 2016-02-26 MED ORDER — NITROFURANTOIN MONOHYD MACRO 100 MG PO CAPS: 100.0000 mg | ORAL_CAPSULE | Freq: Two times a day (BID) | ORAL | 0 refills | Status: DC

## 2016-02-26 NOTE — Addendum Note (Signed)
Addended by: Margaretann Loveless on: 02/26/2016 01:41 PM   Modules accepted: Orders

## 2016-03-03 ENCOUNTER — Telehealth: Payer: Self-pay

## 2016-03-03 LAB — CBC WITH DIFFERENTIAL/PLATELET
BASOS: 0 %
Basophils Absolute: 0 10*3/uL (ref 0.0–0.2)
EOS (ABSOLUTE): 0.1 10*3/uL (ref 0.0–0.4)
Eos: 2 %
HEMOGLOBIN: 15.2 g/dL (ref 11.1–15.9)
Hematocrit: 44.7 % (ref 34.0–46.6)
IMMATURE GRANS (ABS): 0 10*3/uL (ref 0.0–0.1)
Immature Granulocytes: 0 %
LYMPHS ABS: 2.2 10*3/uL (ref 0.7–3.1)
LYMPHS: 35 %
MCH: 31.4 pg (ref 26.6–33.0)
MCHC: 34 g/dL (ref 31.5–35.7)
MCV: 92 fL (ref 79–97)
Monocytes Absolute: 0.5 10*3/uL (ref 0.1–0.9)
Monocytes: 8 %
NEUTROS ABS: 3.5 10*3/uL (ref 1.4–7.0)
Neutrophils: 55 %
PLATELETS: 210 10*3/uL (ref 150–379)
RBC: 4.84 x10E6/uL (ref 3.77–5.28)
RDW: 13.9 % (ref 12.3–15.4)
WBC: 6.4 10*3/uL (ref 3.4–10.8)

## 2016-03-03 LAB — LIPASE: LIPASE: 32 U/L (ref 0–59)

## 2016-03-03 LAB — COMPREHENSIVE METABOLIC PANEL
A/G RATIO: 1.7 (ref 1.2–2.2)
ALBUMIN: 4.2 g/dL (ref 3.6–4.8)
ALT: 23 IU/L (ref 0–32)
AST: 21 IU/L (ref 0–40)
Alkaline Phosphatase: 66 IU/L (ref 39–117)
BILIRUBIN TOTAL: 0.5 mg/dL (ref 0.0–1.2)
BUN / CREAT RATIO: 14 (ref 12–28)
BUN: 13 mg/dL (ref 8–27)
CALCIUM: 9.2 mg/dL (ref 8.7–10.3)
CO2: 25 mmol/L (ref 18–29)
Chloride: 99 mmol/L (ref 96–106)
Creatinine, Ser: 0.91 mg/dL (ref 0.57–1.00)
GFR, EST AFRICAN AMERICAN: 76 mL/min/{1.73_m2} (ref >59)
GFR, EST NON AFRICAN AMERICAN: 65 mL/min/{1.73_m2} (ref >59)
GLOBULIN, TOTAL: 2.5 g/dL (ref 1.5–4.5)
Glucose: 133 mg/dL — ABNORMAL HIGH (ref 65–99)
POTASSIUM: 3.9 mmol/L (ref 3.5–5.2)
Sodium: 141 mmol/L (ref 134–144)
TOTAL PROTEIN: 6.7 g/dL (ref 6.0–8.5)

## 2016-03-03 LAB — TSH: TSH: 7.26 u[IU]/mL — AB (ref 0.450–4.500)

## 2016-03-03 LAB — LIPID PANEL
Chol/HDL Ratio: 3.2 ratio units (ref 0.0–4.4)
Cholesterol, Total: 170 mg/dL (ref 100–199)
HDL: 53 mg/dL (ref >39)
LDL CALC: 89 mg/dL (ref 0–99)
Triglycerides: 142 mg/dL (ref 0–149)
VLDL CHOLESTEROL CAL: 28 mg/dL (ref 5–40)

## 2016-03-03 MED ORDER — LEVOTHYROXINE SODIUM 25 MCG PO TABS: 25.0000 ug | ORAL_TABLET | Freq: Every day | ORAL | 3 refills | Status: DC

## 2016-03-03 NOTE — Telephone Encounter (Signed)
Patient advised as directed below. Voiced understanding.  Thanks,  -Joseline 

## 2016-03-03 NOTE — Addendum Note (Signed)
Addended by: Margaretann Loveless on: 03/03/2016 09:10 AM   Modules accepted: Orders

## 2016-03-03 NOTE — Telephone Encounter (Signed)
-----   Message from Margaretann Loveless, [place] sent at 03/03/2016  9:08 AM EDT ----- All labs are within normal limits and stable with exception of thyroid function. Thyroid function is slightly elevated indicating the thyroid gland is not working as well as it should. Will start a thyroid medication and will recheck labs in 8 weeks. This could definitely be causing some of the fatigue. Not sure of cause of upper abdominal pain, unless it was also from the UTI. Please let me know if pain is not improving or if it worsens.  Fasting sugars have improved. Thanks! -JB

## 2016-03-16 ENCOUNTER — Telehealth: Payer: Self-pay | Admitting: Physician Assistant

## 2016-03-16 NOTE — Telephone Encounter (Signed)
Pt states she is needing a verbal ok to enter an exercise program through her work.  Pt is also requesting her A!C reading and the date it was taken.    Pt is asking about making an appointment for her Mammogram.  [facility]    CB#[phone]/MW

## 2016-03-16 NOTE — Telephone Encounter (Signed)
Patient advised as directed. Thanks. JER

## 2016-03-16 NOTE — Telephone Encounter (Signed)
HgBA1c was 6.1 on 11/18/15. She may now call [facility] to schedule her appt. They have changed the way they schedule screening mammograms. We used to have to fax a form but that is no longer protocol.  She may participate in the exercise program through work as well. This could be very beneficial for her.

## 2016-04-02 ENCOUNTER — Other Ambulatory Visit: Payer: Self-pay | Admitting: Family Medicine

## 2016-04-02 DIAGNOSIS — E785 Hyperlipidemia, unspecified: Secondary | ICD-10-CM

## 2016-04-02 DIAGNOSIS — J302 Other seasonal allergic rhinitis: Secondary | ICD-10-CM

## 2016-04-02 NOTE — Telephone Encounter (Signed)
Jenni's Pt.   Thanks,   -Arul Farabee  

## 2016-04-13 ENCOUNTER — Other Ambulatory Visit: Payer: Self-pay | Admitting: Physician Assistant

## 2016-04-13 DIAGNOSIS — E119 Type 2 diabetes mellitus without complications: Secondary | ICD-10-CM

## 2016-04-15 ENCOUNTER — Encounter: Payer: Self-pay | Admitting: Family Medicine

## 2016-04-15 ENCOUNTER — Ambulatory Visit (INDEPENDENT_AMBULATORY_CARE_PROVIDER_SITE_OTHER): Payer: 59 | Admitting: Family Medicine

## 2016-04-15 DIAGNOSIS — E039 Hypothyroidism, unspecified: Secondary | ICD-10-CM

## 2016-04-15 NOTE — Progress Notes (Signed)
Patient: Christine BlazingCarol C Loewen Female    DOB: 11-15-1950   65 y.o.   MRN: [id number] Visit Date: 04/15/2016  Today's Provider: Mila Merryonald Fisher, MD   Chief Complaint  Patient presents with  . URI   Subjective:    Patient brought her health screen done for [facility] to be filled out if possible. Patient also has a question regarding her Levothyroxine. Patient reports that she forget to take due to the sig: (take 30 minutes before any food or other medications.    Upper Respiratory Infection: Patient complains of symptoms of a URI, possible sinusitis. Symptoms include bilateral ear pain, congestion, cough, plugged sensation in the right ear and sore throat. Onset of symptoms was 3 weeks ago, gradually worsening since that time. She also c/o sinus pressure for the past 1 week .  She is drinking plenty of fluids. Evaluation to date: none. Treatment to date: none.       No Known Allergies   Current Outpatient Prescriptions:  .  amLODipine (NORVASC) 5 MG tablet, Take 1 tablet by mouth  every day, Disp: 90 tablet, Rfl: 1 .  cyclobenzaprine (FLEXERIL) 10 MG tablet, Take 1 tablet (10 mg total) by mouth at bedtime. (Patient not taking: Reported on 02/24/2016), Disp: 15 tablet, Rfl: 0 .  fluticasone (FLONASE) 50 MCG/ACT nasal spray, Place 1 spray into the nose daily. , Disp: , Rfl:  .  JANUVIA 100 MG tablet, Take 1 tablet by mouth  daily, Disp: 90 tablet, Rfl: 1 .  levothyroxine (SYNTHROID, LEVOTHROID) 25 MCG tablet, Take 1 tablet (25 mcg total) by mouth daily before breakfast. Take 30 minutes before any food or other medications., Disp: 30 tablet, Rfl: 3 .  loratadine (CLARITIN) 10 MG tablet, Take 10 mg by mouth daily as needed. , Disp: , Rfl:  .  meloxicam (MOBIC) 15 MG tablet, Take 1 tablet by mouth  every day, Disp: 90 tablet, Rfl: 1 .  metFORMIN (GLUCOPHAGE) 500 MG tablet, Take 2 tablets (1,000 mg total) by mouth daily with breakfast., Disp: 180 tablet, Rfl: 3 .  montelukast (SINGULAIR) 10 MG  tablet, Take 1 tablet by mouth  daily, Disp: 90 tablet, Rfl: 1 .  nitrofurantoin, macrocrystal-monohydrate, (MACROBID) 100 MG capsule, Take 1 capsule (100 mg total) by mouth 2 (two) times daily., Disp: 14 capsule, Rfl: 0 .  ONE TOUCH ULTRA TEST test strip, Check blood sugar before  meals and at bedtime, Disp: 100 each, Rfl: 3 .  ONETOUCH DELICA LANCETS 33G MISC, Check blood sugar before  meals and at bedtime, Disp: 400 each, Rfl: 3 .  quinapril-hydrochlorothiazide (ACCURETIC) 20-25 MG tablet, Take 1 tablet by mouth daily., Disp: 90 tablet, Rfl: 1 .  simvastatin (ZOCOR) 10 MG tablet, Take 1 tablet by mouth  daily, Disp: 90 tablet, Rfl: 1  Review of Systems  Social History  Substance Use Topics  . Smoking status: Never Smoker  . Smokeless tobacco: Never Used  . Alcohol use Yes     Comment: occasional   Objective:   BP (P) 130/86 (BP Location: Left Arm, Patient Position: Sitting, Cuff Size: Large)   Pulse (P) 80   Temp (P) 97.6 F (36.4 C) (Oral)   Resp (P) 16   Ht (P) 4\' 11"  (1.499 m)   Wt (P) 204 lb (92.5 kg)   LMP  (LMP Unknown)   SpO2 (P) 96%   BMI (P) 41.20 kg/m   Physical Exam      Assessment & Plan:  Patient left before being seen by MD.       Mila Merry, MD  [facility]

## 2016-04-28 ENCOUNTER — Encounter: Payer: Self-pay | Admitting: Physician Assistant

## 2016-04-28 ENCOUNTER — Ambulatory Visit (INDEPENDENT_AMBULATORY_CARE_PROVIDER_SITE_OTHER): Payer: 59 | Admitting: Physician Assistant

## 2016-04-28 DIAGNOSIS — J0140 Acute pansinusitis, unspecified: Secondary | ICD-10-CM

## 2016-04-28 DIAGNOSIS — Z6841 Body Mass Index (BMI) 40.0 and over, adult: Secondary | ICD-10-CM | POA: Diagnosis not present
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
## 2016-04-28 NOTE — Patient Instructions (Signed)

## 2016-05-25 ENCOUNTER — Encounter: Payer: Self-pay | Admitting: Physician Assistant

## 2016-08-26 ENCOUNTER — Other Ambulatory Visit: Payer: Self-pay | Admitting: Family Medicine

## 2016-08-26 ENCOUNTER — Other Ambulatory Visit: Payer: Self-pay | Admitting: Physician Assistant

## 2016-08-26 DIAGNOSIS — E119 Type 2 diabetes mellitus without complications: Secondary | ICD-10-CM

## 2016-08-26 DIAGNOSIS — I10 Essential (primary) hypertension: Secondary | ICD-10-CM

## 2016-08-26 DIAGNOSIS — M1990 Unspecified osteoarthritis, unspecified site: Secondary | ICD-10-CM

## 2016-08-26 NOTE — Telephone Encounter (Signed)
Last ov 04/28/16 Last filled 02/24/16. Please review. Thank you. sd

## 2016-08-26 NOTE — Telephone Encounter (Signed)
Jenni's pt.   Thanks,   -Laura  

## 2016-09-04 ENCOUNTER — Other Ambulatory Visit: Payer: Self-pay | Admitting: Physician Assistant

## 2016-09-04 DIAGNOSIS — E119 Type 2 diabetes mellitus without complications: Secondary | ICD-10-CM

## 2016-11-29 ENCOUNTER — Other Ambulatory Visit: Payer: Self-pay | Admitting: Physician Assistant

## 2016-11-29 DIAGNOSIS — E039 Hypothyroidism, unspecified: Secondary | ICD-10-CM

## 2016-11-29 MED ORDER — LEVOTHYROXINE SODIUM 25 MCG PO TABS: 25.0000 ug | ORAL_TABLET | Freq: Every day | ORAL | 1 refills | Status: DC

## 2016-11-29 NOTE — Telephone Encounter (Signed)
[facility] faxed a request on the following medication. Thanks CC   levothyroxine (SYNTHROID, LEVOTHROID) 25 MCG tablet

## 2016-12-07 ENCOUNTER — Ambulatory Visit (INDEPENDENT_AMBULATORY_CARE_PROVIDER_SITE_OTHER): Payer: 59 | Admitting: Physician Assistant

## 2016-12-07 ENCOUNTER — Encounter: Payer: Self-pay | Admitting: Physician Assistant

## 2016-12-07 VITALS — BP 126/84 | HR 92 | Temp 97.8°F | Resp 16 | Wt 205.0 lb

## 2016-12-07 DIAGNOSIS — E039 Hypothyroidism, unspecified: Secondary | ICD-10-CM

## 2016-12-07 DIAGNOSIS — M545 Low back pain, unspecified: Secondary | ICD-10-CM

## 2016-12-07 DIAGNOSIS — I10 Essential (primary) hypertension: Secondary | ICD-10-CM | POA: Diagnosis not present

## 2016-12-07 DIAGNOSIS — E119 Type 2 diabetes mellitus without complications: Secondary | ICD-10-CM

## 2016-12-07 DIAGNOSIS — M7521 Bicipital tendinitis, right shoulder: Secondary | ICD-10-CM

## 2016-12-07 DIAGNOSIS — Z6841 Body Mass Index (BMI) 40.0 and over, adult: Secondary | ICD-10-CM

## 2016-12-07 DIAGNOSIS — M7522 Bicipital tendinitis, left shoulder: Secondary | ICD-10-CM

## 2016-12-07 DIAGNOSIS — R8290 Unspecified abnormal findings in urine: Secondary | ICD-10-CM

## 2016-12-07 LAB — POCT URINALYSIS DIPSTICK
BILIRUBIN UA: NEGATIVE
Glucose, UA: NEGATIVE
KETONES UA: NEGATIVE
LEUKOCYTES UA: NEGATIVE
Nitrite, UA: NEGATIVE
PH UA: 5 (ref 5.0–8.0)
PROTEIN UA: NEGATIVE
Spec Grav, UA: >=1.03 — AB (ref 1.010–1.025)
Urobilinogen, UA: 0.2 E.U./dL

## 2016-12-07 NOTE — Progress Notes (Signed)
Patient: Christine Clarke Female    DOB: June 04, 1951   66 y.o.   MRN: [id number] Visit Date: 12/07/2016  Today's Provider: Margaretann LovelessJennifer M Anatalia Kronk, PA-C   Chief Complaint  Patient presents with  . Back Pain   Subjective:    Back Pain  This is a new problem. The current episode started in the past 7 days. The problem occurs intermittently. The problem is unchanged. The pain is present in the gluteal (right). The quality of the pain is described as aching. The pain does not radiate. The pain is at a severity of 6/10. The pain is moderate. The pain is worse during the day (while standing). The symptoms are aggravated by standing. Associated symptoms include dysuria (pt states she was prescribed Macrobid for a UTI by [facility] about 2 weeks ago) and headaches (possible sinusitis?). Pertinent negatives include no abdominal pain, bladder incontinence, bowel incontinence, chest pain, fever (pt has felt feverish, but does not have a recorded temperature), leg pain or numbness. (Malodorous urine, decreased urine output this weekend) Treatments tried: "Back and Body" pain relief. The treatment provided moderate relief.   Arm Pain Pt is c/o upper right arm pain, and muscle weakness. Pt states she has a H/O this, and was told that Botox was the only option by neurosurgery. Pt states it was secondary to repetitious activity.  That was 30 years ago. Pain reoccurred 6-12 months ago. She reports she feels it most when she is typing on a computer. She denies any numbness, tingling, or burning. Mostly feels aching.  Pt is also c/o fatigue. Pt is supposed to taking Levothyroxine 25 mcg, but has not had this rx filled. States she had trouble remembering to take it first thing in the morning.   Pt would also like to discuss irritability. She states this is worsening in the last 6 months.  GAD 7 : Generalized Anxiety Score 12/07/2016  Nervous, Anxious, on Edge 3  Control/stop worrying 2  Worry too much -  different things 2  Trouble relaxing 3  Restless 2  Easily annoyed or irritable 3  Afraid - awful might happen 1  Total GAD 7 Score 16  Anxiety Difficulty Very difficult      No Known Allergies   Current Outpatient Prescriptions:  .  amLODipine (NORVASC) 5 MG tablet, TAKE 1 TABLET BY MOUTH  EVERY DAY, Disp: 90 tablet, Rfl: 4 .  cyclobenzaprine (FLEXERIL) 10 MG tablet, Take 1 tablet (10 mg total) by mouth at bedtime., Disp: 15 tablet, Rfl: 0 .  fluticasone (FLONASE) 50 MCG/ACT nasal spray, Place 1 spray into the nose daily. , Disp: , Rfl:  .  JANUVIA 100 MG tablet, TAKE 1 TABLET BY MOUTH  DAILY, Disp: 90 tablet, Rfl: 4 .  loratadine (CLARITIN) 10 MG tablet, Take 10 mg by mouth daily as needed. , Disp: , Rfl:  .  meloxicam (MOBIC) 15 MG tablet, TAKE 1 TABLET BY MOUTH  EVERY DAY, Disp: 90 tablet, Rfl: 1 .  metFORMIN (GLUCOPHAGE) 500 MG tablet, Take 2 tablets (1,000 mg total) by mouth daily with breakfast., Disp: 180 tablet, Rfl: 3 .  montelukast (SINGULAIR) 10 MG tablet, Take 1 tablet by mouth  daily, Disp: 90 tablet, Rfl: 1 .  ONE TOUCH ULTRA TEST test strip, CHECK BLOOD SUGAR BEFORE  MEALS AND AT BEDTIME, Disp: 400 each, Rfl: 3 .  ONETOUCH DELICA LANCETS 33G MISC, Check blood sugar before  meals and at bedtime, Disp: 400 each, Rfl:  3 .  quinapril-hydrochlorothiazide (ACCURETIC) 20-25 MG tablet, TAKE 1 TABLET BY MOUTH  DAILY, Disp: 90 tablet, Rfl: 0 .  simvastatin (ZOCOR) 10 MG tablet, Take 1 tablet by mouth  daily, Disp: 90 tablet, Rfl: 1 .  levothyroxine (SYNTHROID, LEVOTHROID) 25 MCG tablet, Take 1 tablet (25 mcg total) by mouth daily before breakfast. Take 30 minutes before any food or other medications. (Patient not taking: Reported on 12/07/2016), Disp: 90 tablet, Rfl: 1  Review of Systems  Constitutional: Positive for fatigue. Negative for fever (pt has felt feverish, but does not have a recorded temperature).  Cardiovascular: Negative for chest pain.  Gastrointestinal: Positive for  diarrhea (yesterday). Negative for abdominal pain and bowel incontinence.  Genitourinary: Positive for dysuria (pt states she was prescribed Macrobid for a UTI by [facility] about 2 weeks ago). Negative for bladder incontinence.  Musculoskeletal: Positive for back pain.  Neurological: Positive for headaches (possible sinusitis?). Negative for numbness.  Psychiatric/Behavioral: Positive for agitation.    Social History  Substance Use Topics  . Smoking status: Never Smoker  . Smokeless tobacco: Never Used  . Alcohol use Yes     Comment: occasional   Objective:   BP 126/84 (BP Location: Left Arm, Patient Position: Sitting, Cuff Size: Large)   Pulse 92   Temp 97.8 F (36.6 C) (Oral)   Resp 16   Wt 205 lb (93 kg)   LMP  (LMP Unknown)   BMI (P) 41.40 kg/m  Vitals:   12/07/16 1123  BP: 126/84  Pulse: 92  Resp: 16  Temp: 97.8 F (36.6 C)  TempSrc: Oral  Weight: 205 lb (93 kg)     Physical Exam  Constitutional: She is oriented to person, place, and time. She appears well-developed and well-nourished. No distress.  HENT:  Head: Normocephalic and atraumatic.  Right Ear: Hearing, tympanic membrane, external ear and ear canal normal.  Left Ear: Hearing, tympanic membrane, external ear and ear canal normal.  Nose: Nose normal. Right sinus exhibits no maxillary sinus tenderness and no frontal sinus tenderness. Left sinus exhibits no maxillary sinus tenderness and no frontal sinus tenderness.  Mouth/Throat: Uvula is midline, oropharynx is clear and moist and mucous membranes are normal. No oropharyngeal exudate, posterior oropharyngeal edema or posterior oropharyngeal erythema.  Eyes: Conjunctivae and EOM are normal. Pupils are equal, round, and reactive to light. Right eye exhibits no discharge. Left eye exhibits no discharge. No scleral icterus.  Neck: Normal range of motion. Neck supple. No JVD present. No tracheal deviation present. No thyromegaly present.  Cardiovascular: Normal  rate, regular rhythm, normal heart sounds and intact distal pulses.  Exam reveals no gallop and no friction rub.   No murmur heard. Pulmonary/Chest: Effort normal. No respiratory distress. She has no decreased breath sounds. She has no wheezes. She has no rhonchi. She has no rales. She exhibits tenderness (left intercostal tedner to palpation; nonspecific, no trigger, notices mostly when she is "stressed").  Abdominal: Soft. Bowel sounds are normal. She exhibits no distension and no mass. There is no tenderness. There is no rebound, no guarding and no CVA tenderness.  Musculoskeletal: Normal range of motion. She exhibits no edema.       Right shoulder: She exhibits tenderness. She exhibits normal range of motion, no bony tenderness, no swelling, no effusion, no crepitus, no pain, no spasm, normal pulse and normal strength.       Left shoulder: She exhibits tenderness. She exhibits normal range of motion, no bony tenderness, no swelling, no  effusion, no pain, no spasm, normal pulse and normal strength.  Lymphadenopathy:    She has no cervical adenopathy.  Neurological: She is alert and oriented to person, place, and time.  Skin: Skin is warm and dry. No rash noted. She is not diaphoretic.  Psychiatric: She has a normal mood and affect. Her behavior is normal. Judgment and thought content normal.  Vitals reviewed.      Assessment & Plan:     1. Right-sided low back pain without sciatica, unspecified chronicity UA was unremarkable with exception of dehydration noted. Will send for culture as below. If culture comes back positive will treat at that time.  - POCT urinalysis dipstick  2. Malodorous urine See above medical treatment plan. - Urine Culture  3. Diabetes mellitus without complication (HCC) Will check labs as below. Continue metformin 1000mg  daily, januvia 100mg  daily.  - CBC w/Diff/Platelet - Comprehensive Metabolic Panel (CMET) - HgB A1c  4. Essential (primary)  hypertension Stable. Continue amlodipine 5mg  and quinapril-HCTZ 20-25mg  daily. Will check labs as below and f/u pending results. - CBC w/Diff/Platelet - Comprehensive Metabolic Panel (CMET)  5. Hypothyroidism, unspecified type Not taking levothyroxine currently. Will check labs as below. She is going to restart levothyroxine to see if it improves any of her symptoms of fatigue and irritability. If symptoms do not improve she is to call the office and schedule appt to discuss different antidepressants that may help.  - Thyroid Panel With TSH  6. BMI 40.0-44.9, adult (HCC) States she is trying to lose weight, but has just not had any energy to exercise more recently.  - CBC w/Diff/Platelet - Comprehensive Metabolic Panel (CMET)  7. Biceps tendinitis of both shoulders Already taking meloxicam 15mg  daily. Discussed she may benefit from steroid injections for inflammation but she refuses at this time. She will call if symptoms worsen and she would like referral to orthopedics.       Margaretann Loveless, PA-C  [facility]

## 2016-12-07 NOTE — Patient Instructions (Signed)
Hypothyroidism Hypothyroidism is a disorder of the thyroid. The thyroid is a large gland that is located in the lower front of the neck. The thyroid releases hormones that control how the body works. With hypothyroidism, the thyroid does not make enough of these hormones. What are the causes? Causes of hypothyroidism may include:  Viral infections.  Pregnancy.  Your own defense system (immune system) attacking your thyroid.  Certain medicines.  Birth defects.  Past radiation treatments to your head or neck.  Past treatment with radioactive iodine.  Past surgical removal of part or all of your thyroid.  Problems with the gland that is located in the center of your brain (pituitary). What are the signs or symptoms? Signs and symptoms of hypothyroidism may include:  Feeling as though you have no energy (lethargy).  Inability to tolerate cold.  Weight gain that is not explained by a change in diet or exercise habits.  Dry skin.  Coarse hair.  Menstrual irregularity.  Slowing of thought processes.  Constipation.  Sadness or depression. How is this diagnosed? Your health care provider may diagnose hypothyroidism with blood tests and ultrasound tests. How is this treated? Hypothyroidism is treated with medicine that replaces the hormones that your body does not make. After you begin treatment, it may take several weeks for symptoms to go away. Follow these instructions at home:  Take medicines only as directed by your health care provider.  If you start taking any new medicines, tell your health care provider.  Keep all follow-up visits as directed by your health care provider. This is important. As your condition improves, your dosage needs may change. You will need to have blood tests regularly so that your health care provider can watch your condition. Contact a health care provider if:  Your symptoms do not get better with treatment.  You are taking thyroid  replacement medicine and:  You sweat excessively.  You have tremors.  You feel anxious.  You lose weight rapidly.  You cannot tolerate heat.  You have emotional swings.  You have diarrhea.  You feel weak. Get help right away if:  You develop chest pain.  You develop an irregular heartbeat.  You develop a rapid heartbeat. This information is not intended to replace advice given to you by your health care provider. Make sure you discuss any questions you have with your health care provider. Document Released: 07/19/2005 Document Revised: 12/25/2015 Document Reviewed: 12/04/2013 Elsevier Interactive Patient Education  2017 [employer] Biceps Tendon Tendinitis (Proximal) and Tenosynovitis The proximal biceps tendon is a strong cord of tissue that connects the biceps muscle, on the front of the upper arm, to the shoulder blade. Tendinitis is inflammation of a tendon. Tenosynovitis is inflammation of the lining around the tendon (tendon sheath). These conditions often occur at the same time, and they can interfere with the ability to bend the elbow and turn the hand palm-up (supination). Proximal biceps tendon tendinitis and tenosynovitis are usually caused by overusing the shoulder joint and the biceps muscle. These conditions usually heal within 6 weeks. Proximal biceps tendon tendinitis may include a grade 1 or grade 2 strain of the tendon. A grade 1 strain is mild, and it involves a slight pull of the tendon without any stretching or noticeable tearing of the tendon. There is usually no loss of biceps muscle strength. A grade 2 strain is moderate, and it involves a small tear in the tendon. The tendon is stretched, and biceps strength is usually decreased. What  are the causes? This condition may be caused by:  A sudden increase in frequency or intensity of activity that involves the shoulder and the biceps muscle.  Overuse of the biceps muscle. This can happen when you do the same  movements over and over, such as:  Supination.  Forceful straightening (hyperextension) of the elbow.  Bending the elbow.  A direct, forceful hit or injury (trauma) to the elbow. This is rare. What increases the risk? The following factors may make you more likely to develop this condition:  Playing contact sports.  Playing sports that involve throwing and overhead movements, including racket sports, gymnastics, weight lifting, or bodybuilding.  Doing physical labor.  Having poor strength and flexibility of the arm and shoulder. What are the signs or symptoms? Symptoms of this condition may include:  Pain and inflammation in the front of the shoulder. Pain may get worse with movement, especially when you use resistance, as in weight lifting.  A feeling of warmth in the front of the shoulder.  Limited range of motion of the shoulder and the elbow.  A crackling sound (crepitation) when you move or touch the shoulder or the upper arm. In some cases, symptoms may return (recur) after treatment, and they may be long-lasting (chronic). How is this diagnosed? This condition is diagnosed based on your symptoms, your medical history, and a physical exam. You may have tests, including X-rays or MRIs. Your health care provider may test your range of motion by asking you to do arm movements. How is this treated? This condition is treated by resting and icing the injured area, and by doing physical therapy exercises. Depending on the severity of your condition, treatment may also include:  Medicines to help relieve pain and inflammation.  Ultrasound therapy. This is the application of sound waves to the injured area.  Injecting medicines (corticosteroids) into your tendon sheath.  Injecting medicines that numb the area (local anesthetics).  Surgery to remove the damaged part of the tendon and reattach the undamaged part of the tendon to the arm bone (humerus). This is usually only done  if you have symptoms that do not get better with other treatment methods. Follow these instructions at home: Managing pain, stiffness, and swelling   If directed, put ice on the injured area:  Put ice in a plastic bag.  Place a towel between your skin and the bag.  Leave the ice on for 20 minutes, 2-3 times a day.  Move your fingers often to avoid stiffness and to lessen swelling.  Raise (elevate) the injured area above the level of your heart while you are sitting or lying down.  If directed, apply heat to the affected area before you exercise. Use the heat source that your health care provider recommends, such as a moist heat pack or a heating pad.  Place a towel between your skin and the heat source.  Leave the heat on for 20-30 minutes.  Remove the heat if your skin turns bright red. This is especially important if you are unable to feel pain, heat, or cold. You may have a greater risk of getting burned. Activity   Return to your normal activities as told by your health care provider. Ask your health care provider what activities are safe for you.  Do not lift anything that is heavier than 10 lb (4.5 kg) until your health care provider tells you that it is safe.  Avoid activities that cause pain or make your condition worse.  Do exercises as told by your health care provider. General instructions   Take over-the-counter and prescription medicines only as told by your health care provider.  Do not drive or operate heavy machinery while taking prescription pain medicines.  Keep all follow-up visits as told by your health care provider. This is important. How is this prevented?  Warm up and stretch before being active.  Cool down and stretch after being active.  Give your body time to rest between periods of activity.  Make sure any equipment that you use is fitted to you.  Be safe and responsible while being active to avoid falls.  Do at least 150 minutes of  moderate-intensity aerobic exercise each week, such as brisk walking or water aerobics.  Maintain physical fitness, including:  Strength.  Flexibility.  Cardiovascular fitness.  Endurance. Contact a health care provider if:  You have symptoms that get worse or do not get better after 2 weeks of treatment.  You develop new symptoms. Get help right away if:  You develop severe pain. This information is not intended to replace advice given to you by your health care provider. Make sure you discuss any questions you have with your health care provider. Document Released: 07/19/2005 Document Revised: 03/25/2016 Document Reviewed: 06/27/2015 Elsevier Interactive Patient Education  2017 [employer].

## 2016-12-09 ENCOUNTER — Telehealth: Payer: Self-pay

## 2016-12-09 LAB — URINE CULTURE: ORGANISM ID, BACTERIA: NO GROWTH

## 2016-12-09 NOTE — Telephone Encounter (Signed)
-----   Message from Margaretann LovelessJennifer M Burnette, [place]PA-C sent at 12/09/2016 10:07 AM EDT ----- Urine culture was negative with no bacterial growth

## 2016-12-09 NOTE — Telephone Encounter (Signed)
Left message informing pt. Christine Clarke, CMA  

## 2017-01-17 ENCOUNTER — Other Ambulatory Visit: Payer: Self-pay | Admitting: Physician Assistant

## 2017-01-17 ENCOUNTER — Other Ambulatory Visit: Payer: Self-pay | Admitting: Family Medicine

## 2017-01-17 DIAGNOSIS — J302 Other seasonal allergic rhinitis: Secondary | ICD-10-CM

## 2017-01-17 DIAGNOSIS — E119 Type 2 diabetes mellitus without complications: Secondary | ICD-10-CM

## 2017-01-17 DIAGNOSIS — E785 Hyperlipidemia, unspecified: Secondary | ICD-10-CM

## 2017-01-17 DIAGNOSIS — I10 Essential (primary) hypertension: Secondary | ICD-10-CM

## 2017-01-17 MED ORDER — METFORMIN HCL 500 MG PO TABS: 1000.0000 mg | ORAL_TABLET | Freq: Every day | ORAL | 3 refills | Status: DC

## 2017-01-17 NOTE — Telephone Encounter (Signed)
[facility] faxed a request for the following medication. Thanks CC  metFORMIN (GLUCOPHAGE) 500 MG tablet  >Take 1 tablet by mouth two times daily.

## 2017-01-17 NOTE — Telephone Encounter (Signed)
See refill request.

## 2017-02-09 ENCOUNTER — Ambulatory Visit (INDEPENDENT_AMBULATORY_CARE_PROVIDER_SITE_OTHER): Payer: 59 | Admitting: Physician Assistant

## 2017-02-09 ENCOUNTER — Encounter: Payer: Self-pay | Admitting: Physician Assistant

## 2017-02-09 VITALS — BP 128/88 | HR 82 | Temp 97.8°F | Resp 16 | Wt 206.0 lb

## 2017-02-09 DIAGNOSIS — E119 Type 2 diabetes mellitus without complications: Secondary | ICD-10-CM

## 2017-02-09 DIAGNOSIS — I10 Essential (primary) hypertension: Secondary | ICD-10-CM | POA: Diagnosis not present

## 2017-02-09 DIAGNOSIS — R0602 Shortness of breath: Secondary | ICD-10-CM

## 2017-02-09 DIAGNOSIS — E8881 Metabolic syndrome: Secondary | ICD-10-CM | POA: Diagnosis not present

## 2017-02-09 DIAGNOSIS — E039 Hypothyroidism, unspecified: Secondary | ICD-10-CM | POA: Diagnosis not present

## 2017-02-09 DIAGNOSIS — F419 Anxiety disorder, unspecified: Secondary | ICD-10-CM | POA: Diagnosis not present

## 2017-02-09 DIAGNOSIS — Z6841 Body Mass Index (BMI) 40.0 and over, adult: Secondary | ICD-10-CM

## 2017-02-09 DIAGNOSIS — E7800 Pure hypercholesterolemia, unspecified: Secondary | ICD-10-CM

## 2017-02-09 NOTE — Progress Notes (Signed)
Patient: Christine Clarke Female    DOB: 13-Jul-1951   66 y.o.   MRN: [id number] Visit Date: 02/09/2017  Today's Provider: Margaretann Loveless, PA-C   Chief Complaint  Patient presents with  . Anxiety   Subjective:    HPI   Patient here today C/O of feeling irritable and anxious in the last 3 months. Patient reports she is still having left sided abdominal pain when she is anxious or irritable. Patient reports thyroid medication daily since last ov on 12/07/16. Patient reports that she has not noticed any improvement with medication. Patient reports that she did not have labs done in 12/07/2016.   She does report she is more irritable. She has a lot of stressors. 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 She also reports she is having stress with her current relationship. She denies any abuse. States her boyfriend's son has been suicidal and this has caused the boyfriend stress, which in turn is affecting her as well. She also reports that she worries about her kids and grandkids often. She does not wish to start any anxiety/depression medications at this time. Feels she is coping well.   Her weight is also a stressor. She reports she has not made any lifestyle modifications as she knows she should. She is hoping to start walking. She does report SOB occasionally with exertion. Only occurred recently during the summer. Never smoked. She did mention "I have to lose weight to start exercising."  Patient C/O vaginal itching on and off times several weeks. Patient reports using vaginal cream which helps with itching. Patient denies fever, vaginal discharge or painful urination.  Not currently problematic as OTC cream helped but patient wanted [place] to be aware in case she requires diflucan as she has had a severe yeast infection in the past. At that time it was felt to be secondary to medications.    ------------------------------------------------------------------------     No Known Allergies   Current Outpatient Prescriptions:  .  amLODipine (NORVASC) 5 MG tablet, TAKE 1 TABLET BY MOUTH  EVERY DAY, Disp: 90 tablet, Rfl: 4 .  cyclobenzaprine (FLEXERIL) 10 MG tablet, Take 1 tablet (10 mg total) by mouth at bedtime., Disp: 15 tablet, Rfl: 0 .  fluticasone (FLONASE) 50 MCG/ACT nasal spray, Place 1 spray into the nose daily. , Disp: , Rfl:  .  JANUVIA 100 MG tablet, TAKE 1 TABLET BY MOUTH  DAILY, Disp: 90 tablet, Rfl: 4 .  levothyroxine (SYNTHROID, LEVOTHROID) 25 MCG tablet, Take 1 tablet (25 mcg total) by mouth daily before breakfast. Take 30 minutes before any food or other medications., Disp: 90 tablet, Rfl: 1 .  loratadine (CLARITIN) 10 MG tablet, Take 10 mg by mouth daily as needed. , Disp: , Rfl:  .  meloxicam (MOBIC) 15 MG tablet, TAKE 1 TABLET BY MOUTH  EVERY DAY, Disp: 90 tablet, Rfl: 1 .  metFORMIN (GLUCOPHAGE) 500 MG tablet, Take 2 tablets (1,000 mg total) by mouth daily with breakfast., Disp: 180 tablet, Rfl: 3 .  montelukast (SINGULAIR) 10 MG tablet, TAKE 1 TABLET BY MOUTH  DAILY, Disp: 90 tablet, Rfl: 4 .  ONE TOUCH ULTRA TEST test strip, CHECK BLOOD SUGAR BEFORE  MEALS AND AT BEDTIME, Disp: 400 each, Rfl: 3 .  ONETOUCH DELICA LANCETS 33G MISC, Check blood sugar before  meals and at bedtime, Disp: 400 each, Rfl: 3 .  quinapril-hydrochlorothiazide (ACCURETIC) 20-25 MG tablet, TAKE 1 TABLET BY  MOUTH  DAILY, Disp: 90 tablet, Rfl: 3 .  simvastatin (ZOCOR) 10 MG tablet, TAKE 1 TABLET BY MOUTH  DAILY, Disp: 90 tablet, Rfl: 4  Review of Systems  Constitutional: Positive for fatigue.       Irritable   HENT: Negative.   Respiratory: Positive for shortness of breath (with exertion). Negative for cough, chest tightness and wheezing.   Cardiovascular: Negative for chest pain, palpitations and leg swelling.  Gastrointestinal: Positive for abdominal pain (left sided only with  anxiety and stress). Negative for constipation, diarrhea, nausea and vomiting.  Genitourinary: Negative for vaginal bleeding, vaginal discharge and vaginal pain.       Vaginal itching  Neurological: Negative.   Psychiatric/Behavioral: Negative for dysphoric mood. The patient is nervous/anxious.     Social History  Substance Use Topics  . Smoking status: Never Smoker  . Smokeless tobacco: Never Used  . Alcohol use Yes     Comment: occasional   Objective:   BP 128/88 (BP Location: Left Arm, Patient Position: Sitting, Cuff Size: Large)   Pulse 82   Temp 97.8 F (36.6 C) (Oral)   Resp 16   Wt 206 lb (93.4 kg)   LMP  (LMP Unknown)   BMI (P) 41.61 kg/m  Vitals:   02/09/17 1149  BP: 128/88  Pulse: 82  Resp: 16  Temp: 97.8 F (36.6 C)  TempSrc: Oral  Weight: 206 lb (93.4 kg)     Physical Exam  Constitutional: She is oriented to person, place, and time. She appears well-developed and well-nourished. No distress.  Neck: Normal range of motion. Neck supple. No thyromegaly present.  Cardiovascular: Normal rate, regular rhythm and normal heart sounds.  Exam reveals no gallop and no friction rub.   No murmur heard. Pulmonary/Chest: Effort normal and breath sounds normal. No respiratory distress. She has no wheezes. She has no rales.  Abdominal: Soft. Normal appearance and bowel sounds are normal. She exhibits no distension and no mass. There is no hepatosplenomegaly. There is no tenderness. There is no rebound, no guarding and no CVA tenderness.  Neurological: She is alert and oriented to person, place, and time.  Skin: Skin is warm and dry. She is not diaphoretic.  Psychiatric: Her behavior is normal. Judgment and thought content normal. Her mood appears anxious. Her speech is rapid and/or pressured. Cognition and memory are normal.  Vitals reviewed.       Assessment & Plan:     1. Essential (primary) hypertension Stable. Continue amlodipine 5mg  and quinapril-HCTZ 20-25mg .  Will check labs as below and f/u pending results. - CBC w/Diff/Platelet - Comprehensive Metabolic Panel (CMET) - Lipid Profile - HgB A1c  2. Hypothyroidism, unspecified type Levothyroxine 25mcg only been taken over the last month. Will check labs as below and f/u pending results. - TSH - T4  3. SOB (shortness of breath) on exertion Suspect more from deconditioning and obesity. Discussed slowly adding in exercise and working on healthy dieting habits.  - CBC w/Diff/Platelet - Comprehensive Metabolic Panel (CMET)  4. Diabetes mellitus without complication (HCC) Stable. Continue metformin 1000 mg daily and Januvia 100mg . Will check labs as below and f/u pending results. - CBC w/Diff/Platelet - Comprehensive Metabolic Panel (CMET) - Lipid Profile - HgB A1c  5. Anxiety I feel patient has a lot of baseline anxiety from outside stressors that could be causing some of her symptoms. Patient declines wanting medications at this time. States "I will call when I need you for that."  6. Pure hypercholesterolemia Stable.  Continue simvastatin 10mg . Will check labs as below and f/u pending results. - CBC w/Diff/Platelet - Comprehensive Metabolic Panel (CMET) - Lipid Profile - HgB A1c  7. BMI 40.0-44.9, adult [facility]) Discussed exercise and dietary habits. She is going to start trying to walk again. Medically managed comorbidities. Will check labs as below and f/u pending results. - Comprehensive Metabolic Panel (CMET) - Lipid Profile - HgB A1c  8. Metabolic syndrome See above medical treatment plan. - Comprehensive Metabolic Panel (CMET) - Lipid Profile - HgB A1c       Margaretann Loveless, PA-C  [facility]

## 2017-02-11 LAB — LIPID PANEL
CHOL/HDL RATIO: 3.4 ratio (ref 0.0–4.4)
Cholesterol, Total: 181 mg/dL (ref 100–199)
HDL: 54 mg/dL (ref >39)
LDL Calculated: 83 mg/dL (ref 0–99)
TRIGLYCERIDES: 221 mg/dL — AB (ref 0–149)
VLDL CHOLESTEROL CAL: 44 mg/dL — AB (ref 5–40)

## 2017-02-11 LAB — T4: T4, Total: 7.2 ug/dL (ref 4.5–12.0)

## 2017-02-11 LAB — COMPREHENSIVE METABOLIC PANEL
A/G RATIO: 2 (ref 1.2–2.2)
ALT: 39 IU/L — AB (ref 0–32)
AST: 20 IU/L (ref 0–40)
Albumin: 4.4 g/dL (ref 3.6–4.8)
Alkaline Phosphatase: 59 IU/L (ref 39–117)
BUN/Creatinine Ratio: 23 (ref 12–28)
BUN: 18 mg/dL (ref 8–27)
Bilirubin Total: 0.3 mg/dL (ref 0.0–1.2)
CALCIUM: 9.2 mg/dL (ref 8.7–10.3)
CO2: 24 mmol/L (ref 20–29)
CREATININE: 0.8 mg/dL (ref 0.57–1.00)
Chloride: 103 mmol/L (ref 96–106)
GFR, EST AFRICAN AMERICAN: 88 mL/min/{1.73_m2} (ref >59)
GFR, EST NON AFRICAN AMERICAN: 76 mL/min/{1.73_m2} (ref >59)
Globulin, Total: 2.2 g/dL (ref 1.5–4.5)
Glucose: 174 mg/dL — ABNORMAL HIGH (ref 65–99)
Potassium: 4.1 mmol/L (ref 3.5–5.2)
Sodium: 140 mmol/L (ref 134–144)
TOTAL PROTEIN: 6.6 g/dL (ref 6.0–8.5)

## 2017-02-11 LAB — CBC WITH DIFFERENTIAL/PLATELET
BASOS: 0 %
Basophils Absolute: 0 10*3/uL (ref 0.0–0.2)
EOS (ABSOLUTE): 0.1 10*3/uL (ref 0.0–0.4)
EOS: 2 %
Hematocrit: 41.9 % (ref 34.0–46.6)
Hemoglobin: 14.5 g/dL (ref 11.1–15.9)
IMMATURE GRANS (ABS): 0 10*3/uL (ref 0.0–0.1)
IMMATURE GRANULOCYTES: 0 %
LYMPHS: 31 %
Lymphocytes Absolute: 2.2 10*3/uL (ref 0.7–3.1)
MCH: 31 pg (ref 26.6–33.0)
MCHC: 34.6 g/dL (ref 31.5–35.7)
MCV: 90 fL (ref 79–97)
MONOS ABS: 0.5 10*3/uL (ref 0.1–0.9)
Monocytes: 7 %
NEUTROS PCT: 60 %
Neutrophils Absolute: 4.2 10*3/uL (ref 1.4–7.0)
PLATELETS: 170 10*3/uL (ref 150–379)
RBC: 4.67 x10E6/uL (ref 3.77–5.28)
RDW: 14.2 % (ref 12.3–15.4)
WBC: 7 10*3/uL (ref 3.4–10.8)

## 2017-02-11 LAB — HEMOGLOBIN A1C
ESTIMATED AVERAGE GLUCOSE: 160 mg/dL
Hgb A1c MFr Bld: 7.2 % — ABNORMAL HIGH (ref 4.8–5.6)

## 2017-02-11 LAB — TSH: TSH: 6.26 u[IU]/mL — AB (ref 0.450–4.500)

## 2017-03-30 ENCOUNTER — Ambulatory Visit (INDEPENDENT_AMBULATORY_CARE_PROVIDER_SITE_OTHER): Payer: 59 | Admitting: Physician Assistant

## 2017-03-30 ENCOUNTER — Encounter: Payer: Self-pay | Admitting: Physician Assistant

## 2017-03-30 VITALS — BP 150/90 | HR 88 | Temp 98.3°F | Resp 16 | Wt 204.8 lb

## 2017-03-30 DIAGNOSIS — H6121 Impacted cerumen, right ear: Secondary | ICD-10-CM

## 2017-03-30 DIAGNOSIS — H6993 Unspecified Eustachian tube disorder, bilateral: Secondary | ICD-10-CM

## 2017-03-30 NOTE — Progress Notes (Signed)
Patient: Christine Clarke Female    DOB: [birth year]   66 y.o.   MRN: [id number] Visit Date: 03/30/2017  Today's Provider: Margaretann Loveless, PA-C   Chief Complaint  Patient presents with  . Sinusitis   Subjective:    Sinusitis  This is a new problem. The current episode started 1 to 4 weeks ago. The problem has been gradually worsening since onset. There has been no fever. The pain is moderate. Associated symptoms include chills, congestion, coughing (dry cough), ear pain (right ear pain), shortness of breath and sinus pressure. Pertinent negatives include no headaches, sneezing, sore throat or swollen glands. (Twitch under right eye) Past treatments include spray decongestants. The treatment provided no relief.      No Known Allergies   Current Outpatient Prescriptions:  .  amLODipine (NORVASC) 5 MG tablet, TAKE 1 TABLET BY MOUTH  EVERY DAY, Disp: 90 tablet, Rfl: 4 .  cyclobenzaprine (FLEXERIL) 10 MG tablet, Take 1 tablet (10 mg total) by mouth at bedtime., Disp: 15 tablet, Rfl: 0 .  fluticasone (FLONASE) 50 MCG/ACT nasal spray, Place 1 spray into the nose daily. , Disp: , Rfl:  .  JANUVIA 100 MG tablet, TAKE 1 TABLET BY MOUTH  DAILY, Disp: 90 tablet, Rfl: 4 .  levothyroxine (SYNTHROID, LEVOTHROID) 25 MCG tablet, Take 1 tablet (25 mcg total) by mouth daily before breakfast. Take 30 minutes before any food or other medications., Disp: 90 tablet, Rfl: 1 .  loratadine (CLARITIN) 10 MG tablet, Take 10 mg by mouth daily as needed. , Disp: , Rfl:  .  meloxicam (MOBIC) 15 MG tablet, TAKE 1 TABLET BY MOUTH  EVERY DAY, Disp: 90 tablet, Rfl: 1 .  metFORMIN (GLUCOPHAGE) 500 MG tablet, Take 2 tablets (1,000 mg total) by mouth daily with breakfast., Disp: 180 tablet, Rfl: 3 .  montelukast (SINGULAIR) 10 MG tablet, TAKE 1 TABLET BY MOUTH  DAILY, Disp: 90 tablet, Rfl: 4 .  ONE TOUCH ULTRA TEST test strip, CHECK BLOOD SUGAR BEFORE  MEALS AND AT BEDTIME, Disp: 400 each, Rfl: 3 .  ONETOUCH  DELICA LANCETS 33G MISC, Check blood sugar before  meals and at bedtime, Disp: 400 each, Rfl: 3 .  quinapril-hydrochlorothiazide (ACCURETIC) 20-25 MG tablet, TAKE 1 TABLET BY MOUTH  DAILY, Disp: 90 tablet, Rfl: 3 .  simvastatin (ZOCOR) 10 MG tablet, TAKE 1 TABLET BY MOUTH  DAILY, Disp: 90 tablet, Rfl: 4  Review of Systems  Constitutional: Positive for chills.  HENT: Positive for congestion, ear pain (right ear pain), postnasal drip, rhinorrhea, sinus pain and sinus pressure. Negative for sneezing, sore throat and trouble swallowing.   Respiratory: Positive for cough (dry cough) and shortness of breath. Negative for chest tightness and wheezing.   Neurological: Negative for dizziness, light-headedness and headaches.    Social History  Substance Use Topics  . Smoking status: Never Smoker  . Smokeless tobacco: Never Used  . Alcohol use Yes     Comment: occasional   Objective:   BP (!) 150/90 (BP Location: Left Arm, Patient Position: Sitting, Cuff Size: Normal)   Pulse 88   Temp 98.3 F (36.8 C) (Oral)   Resp 16   Wt 204 lb 12.8 oz (92.9 kg)   LMP  (LMP Unknown)   SpO2 99%   BMI (P) 41.36 kg/m  Vitals:   03/30/17 1131  BP: (!) 150/90  Pulse: 88  Resp: 16  Temp: 98.3 F (36.8 C)  TempSrc: Oral  SpO2: 99%  Weight: 204 lb 12.8 oz (92.9 kg)     Physical Exam  Constitutional: She appears well-developed and well-nourished. No distress.  HENT:  Head: Normocephalic and atraumatic.  Right Ear: Hearing, external ear and ear canal normal. A middle ear effusion (clear) is present.  Left Ear: Hearing, external ear and ear canal normal. A middle ear effusion (clear) is present.  Nose: Nose normal.  Mouth/Throat: Uvula is midline, oropharynx is clear and moist and mucous membranes are normal. No oropharyngeal exudate, posterior oropharyngeal edema or posterior oropharyngeal erythema.  Cerumen noted obscuring TM in R ear. Ear lavage performed successfully and TM examined.  Eyes: Pupils  are equal, round, and reactive to light. Conjunctivae are normal. Right eye exhibits no discharge. Left eye exhibits no discharge. No scleral icterus.  Neck: Normal range of motion. Neck supple. No tracheal deviation present. No thyromegaly present.  Cardiovascular: Normal rate, regular rhythm and normal heart sounds.  Exam reveals no gallop and no friction rub.   No murmur heard. Pulmonary/Chest: Effort normal and breath sounds normal. No stridor. No respiratory distress. She has no wheezes. She has no rales.  Lymphadenopathy:    She has no cervical adenopathy.  Skin: Skin is warm and dry. She is not diaphoretic.  Vitals reviewed.       Assessment & Plan:     1. Eustachian tube disorder, bilateral Slight bulge with serous effusion noted bilaterally. No sinus tenderness. Continue allergy medications. Add Mucinex. Call if no improvement.   2. Impacted cerumen of right ear Lavage successful.  - Ear Lavage       Margaretann LovelessJennifer M Burnette, PA-C  [facility]

## 2017-03-30 NOTE — Patient Instructions (Signed)
Barotitis Media Barotitis media is inflammation of the middle ear. This condition occurs when an auditory tube (eustachian tube) is blocked in one or both ears. These tubes lead from the middle ear to the back of the nose (nasopharynx). This condition typically occurs when you experience changes in pressure, such as when flying or scuba diving. Untreated barotitis media may lead to damage or hearing loss (barotrauma), which may become permanent. What are the causes? This condition may be caused by changes in air pressure from:  Flying.  Scuba diving.  A nearby explosion.  What increases the risk? The following factors may make you more likely to develop this condition:  Middle ear infection.  Sinus infection.  A cold.  Environmental allergies.  Small eustachian tubes.  Recent ear surgery.  What are the signs or symptoms? Symptoms of this condition may include:  Ear pain.  Hearing loss.  In severe cases, symptoms can include:  Dizziness and nausea (vertigo).  Temporary facial paralysis.  How is this diagnosed? This condition is diagnosed based on:  A physical exam. Your health care provider may: ? Use a device (otoscope) to look into your ear canal and check your eardrum. ? Do a test that changes air pressure in the middle ear to check how well the eardrum moves and to see if the eustachian tube is working(tympanogram).  Your medical history.  In some cases, your health care provider may have you take a hearing test. You may also be referred to someone who specializes in ear treatment (otolaryngologist, "ENT"). How is this treated? This condition may be treated with:  Medicines to relieve congestion in your nose, sinus, or upper respiratory tract (decongestants).  Techniques to equalize pressure (to "pop" your ears), such as: ? Yawning. ? Chewing gum. ? Swallowing.  In severe cases, you may need surgery to relieve your symptoms or to prevent future  inflammation. Follow these instructions at home:  Take over-the-counter and prescription medicines only as told by your health care provider.  Do not put anything into your ears to clean or unplug them. Ear drops will not help.  Keep all follow-up visits as told by your health care provider. This is important. How is this prevented? Using these strategies may help to prevent barotitis media:  Chewing gum with frequent, forceful swallowing during takeoff and landing when flying.  Holding your nose and gently blowing to pop your ears for equalizing pressure changes. This forces air into the eustachian tube.  Yawning during air pressure changes.  Using a nasal decongestant about 30-60 minutes before flying, if you have nasal congestion.  Contact a health care provider if:  You have vertigo.  You have hearing loss.  Your symptoms do not get better or they get worse.  You have a fever. Get help right away if:  You have a severe headache, ear pain, and dizziness.  You have balance problems.  You cannot move or feel part of your face.  You have bloody or pus-like drainage from your ears. Summary  Barotitis media is inflammation of the middle ear.  This condition typically occurs when you experience changes in pressure, such as when flying or scuba diving.  You may be at a higher risk for this condition if you have small eustachian tubes, had recent ear surgery, or have allergies, a cold, or sinus or middle ear infection.  This condition may be treated with medicines or techniques to equalize pressure in your ears.  Strategies can be used to  help prevent barotitis media. This information is not intended to replace advice given to you by your health care provider. Make sure you discuss any questions you have with your health care provider. Document Released: 07/16/2000 Document Revised: 06/07/2016 Document Reviewed: 06/07/2016 Elsevier Interactive Patient Education  2017  Elsevier Inc. Earwax Buildup, Adult The ears produce a substance called earwax that helps keep bacteria out of the ear and protects the skin in the ear canal. Occasionally, earwax can build up in the ear and cause discomfort or hearing loss. What increases the risk? This condition is more likely to develop in people who:  Are female.  Are elderly.  Naturally produce more earwax.  Clean their ears often with cotton swabs.  Use earplugs often.  Use in-ear headphones often.  Wear hearing aids.  Have narrow ear canals.  Have earwax that is overly thick or sticky.  Have eczema.  Are dehydrated.  Have excess hair in the ear canal.  What are the signs or symptoms? Symptoms of this condition include:  Reduced or muffled hearing.  A feeling of fullness in the ear or feeling that the ear is plugged.  Fluid coming from the ear.  Ear pain.  Ear itch.  Ringing in the ear.  Coughing.  An obvious piece of earwax that can be seen inside the ear canal.  How is this diagnosed? This condition may be diagnosed based on:  Your symptoms.  Your medical history.  An ear exam. During the exam, your health care provider will look into your ear with an instrument called an otoscope.  You may have tests, including a hearing test. How is this treated? This condition may be treated by:  Using ear drops to soften the earwax.  Having the earwax removed by a health care provider. The health care provider may: ? Flush the ear with water. ? Use an instrument that has a loop on the end (curette). ? Use a suction device.  Surgery to remove the wax buildup. This may be done in severe cases.  Follow these instructions at home:  Take over-the-counter and prescription medicines only as told by your health care provider.  Do not put any objects, including cotton swabs, into your ear. You can clean the opening of your ear canal with a washcloth or facial tissue.  Follow instructions  from your health care provider about cleaning your ears. Do not over-clean your ears.  Drink enough fluid to keep your urine clear or pale yellow. This will help to thin the earwax.  Keep all follow-up visits as told by your health care provider. If earwax builds up in your ears often or if you use hearing aids, consider seeing your health care provider for routine, preventive ear cleanings. Ask your health care provider how often you should schedule your cleanings.  If you have hearing aids, clean them according to instructions from the manufacturer and your health care provider. Contact a health care provider if:  You have ear pain.  You develop a fever.  You have blood, pus, or other fluid coming from your ear.  You have hearing loss.  You have ringing in your ears that does not go away.  Your symptoms do not improve with treatment.  You feel like the room is spinning (vertigo). Summary  Earwax can build up in the ear and cause discomfort or hearing loss.  The most common symptoms of this condition include reduced or muffled hearing and a feeling of fullness in the ear  or feeling that the ear is plugged.  This condition may be diagnosed based on your symptoms, your medical history, and an ear exam.  This condition may be treated by using ear drops to soften the earwax or by having the earwax removed by a health care provider.  Do not put any objects, including cotton swabs, into your ear. You can clean the opening of your ear canal with a washcloth or facial tissue. This information is not intended to replace advice given to you by your health care provider. Make sure you discuss any questions you have with your health care provider. Document Released: 08/26/2004 Document Revised: 09/29/2016 Document Reviewed: 09/29/2016 Elsevier Interactive Patient Education  [employer].

## 2017-04-06 ENCOUNTER — Telehealth: Payer: Self-pay | Admitting: Physician Assistant

## 2017-04-06 DIAGNOSIS — J0140 Acute pansinusitis, unspecified: Secondary | ICD-10-CM

## 2017-04-06 MED ORDER — AZITHROMYCIN 250 MG PO TABS: ORAL_TABLET | ORAL | 0 refills | Status: DC

## 2017-04-06 NOTE — Telephone Encounter (Signed)
Sent!

## 2017-04-06 NOTE — Telephone Encounter (Signed)
Pt stated that she isn't feeling better and she has been taking OTC medications as directed but she is still having ear pain, sinus pressure, & drainage. [facility] [address] Please advise. Thanks TNP

## 2017-04-06 NOTE — Telephone Encounter (Signed)
Patient advised as directed below.  Thanks,  -Joseline 

## 2017-05-05 ENCOUNTER — Other Ambulatory Visit: Payer: Self-pay | Admitting: Physician Assistant

## 2017-05-05 ENCOUNTER — Other Ambulatory Visit: Payer: Self-pay | Admitting: Family Medicine

## 2017-05-05 DIAGNOSIS — M1990 Unspecified osteoarthritis, unspecified site: Secondary | ICD-10-CM

## 2017-05-05 DIAGNOSIS — E039 Hypothyroidism, unspecified: Secondary | ICD-10-CM

## 2017-05-17 ENCOUNTER — Encounter: Payer: Self-pay | Admitting: Physician Assistant

## 2017-05-17 ENCOUNTER — Ambulatory Visit (INDEPENDENT_AMBULATORY_CARE_PROVIDER_SITE_OTHER): Payer: 59 | Admitting: Physician Assistant

## 2017-05-17 VITALS — BP 140/80 | HR 83 | Temp 98.2°F | Resp 16 | Wt 206.2 lb

## 2017-05-17 DIAGNOSIS — R300 Dysuria: Secondary | ICD-10-CM

## 2017-05-17 DIAGNOSIS — J301 Allergic rhinitis due to pollen: Secondary | ICD-10-CM | POA: Diagnosis not present

## 2017-05-17 DIAGNOSIS — N3001 Acute cystitis with hematuria: Secondary | ICD-10-CM | POA: Diagnosis not present

## 2017-05-17 LAB — POCT URINALYSIS DIPSTICK
BILIRUBIN UA: NEGATIVE
GLUCOSE UA: NEGATIVE
KETONES UA: 5
Nitrite, UA: POSITIVE
PROTEIN UA: NEGATIVE
SPEC GRAV UA: 1.025 (ref 1.010–1.025)
Urobilinogen, UA: 0.2 E.U./dL
pH, UA: 6 (ref 5.0–8.0)

## 2017-05-17 MED ORDER — SULFAMETHOXAZOLE-TRIMETHOPRIM 800-160 MG PO TABS: 1.0000 | ORAL_TABLET | Freq: Two times a day (BID) | ORAL | 0 refills | Status: DC

## 2017-05-17 MED ORDER — FLUTICASONE PROPIONATE 50 MCG/ACT NA SUSP: 1.0000 | Freq: Every day | NASAL | 1 refills | Status: DC

## 2017-05-17 MED ORDER — FLUTICASONE PROPIONATE 50 MCG/ACT NA SUSP: 1.0000 | Freq: Every day | NASAL | 0 refills | Status: DC

## 2017-05-17 MED ORDER — PHENAZOPYRIDINE HCL 100 MG PO TABS: 100.0000 mg | ORAL_TABLET | Freq: Three times a day (TID) | ORAL | 0 refills | Status: DC | PRN

## 2017-05-17 NOTE — Progress Notes (Signed)
Patient: Christine Clarke Female    DOB: May 28, 1951   66 y.o.   MRN: [id number] Visit Date: 05/17/2017  Today's Provider: Margaretann Loveless, PA-C   Chief Complaint  Patient presents with  . Urinary Tract Infection   Subjective:    Urinary Tract Infection   This is a new problem. The current episode started yesterday (Frequency started ). The problem occurs every urination. The problem has been gradually worsening. The quality of the pain is described as aching and burning (this morning it started to hurt). The pain is at a severity of 8/10. The pain is moderate. There has been no fever. Associated symptoms include chills, frequency, hematuria and urgency. Pertinent negatives include no discharge. She has tried nothing for the symptoms.   She is also needing BMI appeal form completed today.   She is also having some increased fullness in her ears bilaterally with a cracking/popping sensation. No ear pain or drainage. Does have a h/o allergic rhinitis and has used flonase in the past but not currently using anything.    No Known Allergies   Current Outpatient Prescriptions:  .  amLODipine (NORVASC) 5 MG tablet, TAKE 1 TABLET BY MOUTH  EVERY DAY, Disp: 90 tablet, Rfl: 4 .  fluticasone (FLONASE) 50 MCG/ACT nasal spray, Place 1 spray into the nose daily. , Disp: , Rfl:  .  JANUVIA 100 MG tablet, TAKE 1 TABLET BY MOUTH  DAILY, Disp: 90 tablet, Rfl: 4 .  levothyroxine (SYNTHROID, LEVOTHROID) 25 MCG tablet, TAKE 1 TABLET BY MOUTH  DAILY BEFORE BREAKFAST.  TAKE 30 MINUTES BEFORE ANY  FOOD OR OTHER MEDICATIONS., Disp: 90 tablet, Rfl: 1 .  loratadine (CLARITIN) 10 MG tablet, Take 10 mg by mouth daily as needed. , Disp: , Rfl:  .  meloxicam (MOBIC) 15 MG tablet, TAKE 1 TABLET BY MOUTH  EVERY DAY, Disp: 90 tablet, Rfl: 1 .  metFORMIN (GLUCOPHAGE) 500 MG tablet, Take 2 tablets (1,000 mg total) by mouth daily with breakfast., Disp: 180 tablet, Rfl: 3 .  montelukast (SINGULAIR) 10 MG tablet,  TAKE 1 TABLET BY MOUTH  DAILY, Disp: 90 tablet, Rfl: 4 .  ONE TOUCH ULTRA TEST test strip, CHECK BLOOD SUGAR BEFORE  MEALS AND AT BEDTIME, Disp: 400 each, Rfl: 3 .  ONETOUCH DELICA LANCETS 33G MISC, Check blood sugar before  meals and at bedtime, Disp: 400 each, Rfl: 3 .  quinapril-hydrochlorothiazide (ACCURETIC) 20-25 MG tablet, TAKE 1 TABLET BY MOUTH  DAILY, Disp: 90 tablet, Rfl: 3 .  simvastatin (ZOCOR) 10 MG tablet, TAKE 1 TABLET BY MOUTH  DAILY, Disp: 90 tablet, Rfl: 4 .  azithromycin (ZITHROMAX) 250 MG tablet, Take 2 tablets PO on day one, and one tablet PO daily thereafter until completed. (Patient not taking: Reported on 05/17/2017), Disp: 6 tablet, Rfl: 0 .  cyclobenzaprine (FLEXERIL) 10 MG tablet, Take 1 tablet (10 mg total) by mouth at bedtime. (Patient not taking: Reported on 05/17/2017), Disp: 15 tablet, Rfl: 0  Review of Systems  Constitutional: Positive for chills. Negative for fever.  HENT: Positive for congestion, ear pain (fullness not pain) and postnasal drip. Negative for ear discharge, rhinorrhea, sinus pain, sinus pressure, sneezing, sore throat and tinnitus.   Respiratory: Negative for cough, chest tightness and shortness of breath.   Cardiovascular: Negative for chest pain, palpitations and leg swelling.  Gastrointestinal: Negative for abdominal pain.  Endocrine: Positive for polyuria.  Genitourinary: Positive for dysuria, frequency, hematuria and urgency. Negative for pelvic pain  and vaginal discharge.  Musculoskeletal: Negative for back pain.  Neurological: Negative for dizziness and headaches.    Social History  Substance Use Topics  . Smoking status: Never Smoker  . Smokeless tobacco: Never Used  . Alcohol use Yes     Comment: occasional   Objective:   BP 140/80 (BP Location: Left Arm, Patient Position: Sitting, Cuff Size: Large)   Pulse 83   Temp 98.2 F (36.8 C) (Oral)   Resp 16   Wt 206 lb 3.2 oz (93.5 kg)   LMP  (LMP Unknown)   SpO2 97%   BMI (P)  41.65 kg/m  Vitals:   05/17/17 1116  BP: 140/80  Pulse: 83  Resp: 16  Temp: 98.2 F (36.8 C)  TempSrc: Oral  SpO2: 97%  Weight: 206 lb 3.2 oz (93.5 kg)     Physical Exam  Constitutional: She is oriented to person, place, and time. She appears well-developed and well-nourished. No distress.  HENT:  Head: Normocephalic and atraumatic.  Right Ear: Hearing, tympanic membrane, external ear and ear canal normal.  Left Ear: Hearing, tympanic membrane, external ear and ear canal normal.  Nose: Nose normal.  Mouth/Throat: Uvula is midline, oropharynx is clear and moist and mucous membranes are normal. No oropharyngeal exudate.  Eyes: Pupils are equal, round, and reactive to light. Conjunctivae are normal. Right eye exhibits no discharge. Left eye exhibits no discharge. No scleral icterus.  Neck: Normal range of motion. Neck supple. No tracheal deviation present. No thyromegaly present.  Cardiovascular: Normal rate, regular rhythm and normal heart sounds.  Exam reveals no gallop and no friction rub.   No murmur heard. Pulmonary/Chest: Effort normal and breath sounds normal. No stridor. No respiratory distress. She has no wheezes. She has no rales.  Abdominal: Soft. Normal appearance and bowel sounds are normal. She exhibits no distension and no mass. There is no hepatosplenomegaly. There is tenderness in the suprapubic area. There is no rebound, no guarding and no CVA tenderness.  Lymphadenopathy:    She has no cervical adenopathy.  Neurological: She is alert and oriented to person, place, and time.  Skin: Skin is warm and dry. She is not diaphoretic.  Vitals reviewed.       Assessment & Plan:     1. Acute cystitis with hematuria Worsening symptoms. UA positive. Will treat empirically with Bactrim as below. Pyridium given for spasm. Continue to push fluids. Urine sent for culture. Will follow up pending C&S results. She is to call if symptoms do not improve or if they worsen.  - POCT  urinalysis dipstick - Urine Culture - sulfamethoxazole-trimethoprim (BACTRIM DS,SEPTRA DS) 800-160 MG tablet; Take 1 tablet by mouth 2 (two) times daily.  Dispense: 20 tablet; Refill: 0 - phenazopyridine (PYRIDIUM) 100 MG tablet; Take 1 tablet (100 mg total) by mouth 3 (three) times daily as needed for pain.  Dispense: 15 tablet; Refill: 0  2. Dysuria See above medical treatment plan. - POCT urinalysis dipstick - Urine Culture - phenazopyridine (PYRIDIUM) 100 MG tablet; Take 1 tablet (100 mg total) by mouth 3 (three) times daily as needed for pain.  Dispense: 15 tablet; Refill: 0  3. Seasonal allergic rhinitis due to pollen Restart flonase for ETD with allergic rhinitis. Call if symptoms worsen. - fluticasone (FLONASE) 50 MCG/ACT nasal spray; Place 1 spray into both nostrils daily.  Dispense: 48 g; Refill: 1       Margaretann Loveless, PA-C  [facility]

## 2017-05-19 ENCOUNTER — Telehealth: Payer: Self-pay

## 2017-05-19 LAB — URINE CULTURE
MICRO NUMBER:: 81152858
SPECIMEN QUALITY: ADEQUATE

## 2017-05-19 NOTE — Telephone Encounter (Signed)
-----   Message from Margaretann LovelessJennifer M Burnette, [place] JerseyPA-C sent at 05/19/2017 10:23 AM EDT ----- Urine culture was positive for e.coli and is susceptible to bactrim. Continue until completed.

## 2017-05-19 NOTE — Telephone Encounter (Signed)
Patient advised as directed below. Reports she is doing better.  Thanks, -Avilene Marrin

## 2017-08-04 ENCOUNTER — Other Ambulatory Visit: Payer: Self-pay | Admitting: Ophthalmology

## 2017-08-04 DIAGNOSIS — G5139 Clonic hemifacial spasm, unspecified: Secondary | ICD-10-CM

## 2017-08-12 ENCOUNTER — Ambulatory Visit
Admission: RE | Admit: 2017-08-12 | Discharge: 2017-08-12 | Disposition: A | Discharge status: Home / Self Care | Payer: 59 | Source: Ambulatory Visit | Attending: Ophthalmology | Admitting: Ophthalmology

## 2017-08-12 DIAGNOSIS — G5139 Clonic hemifacial spasm, unspecified: Secondary | ICD-10-CM

## 2017-08-15 ENCOUNTER — Ambulatory Visit: Payer: 59 | Admitting: Family Medicine

## 2017-08-15 ENCOUNTER — Encounter: Payer: Self-pay | Admitting: Family Medicine

## 2017-08-15 VITALS — BP 140/72 | HR 96 | Temp 98.0°F | Resp 20 | Wt 206.0 lb

## 2017-08-15 DIAGNOSIS — J0101 Acute recurrent maxillary sinusitis: Secondary | ICD-10-CM | POA: Diagnosis not present

## 2017-08-15 DIAGNOSIS — J069 Acute upper respiratory infection, unspecified: Secondary | ICD-10-CM | POA: Diagnosis not present

## 2017-08-15 MED ORDER — AMOXICILLIN-POT CLAVULANATE 875-125 MG PO TABS: 1.0000 | ORAL_TABLET | Freq: Two times a day (BID) | ORAL | 0 refills | Status: AC

## 2017-08-15 NOTE — Patient Instructions (Signed)

## 2017-08-15 NOTE — Progress Notes (Signed)
Patient: Christine Clarke Female    DOB: [birth year]   67 y.o.   MRN: [id number] Visit Date: 08/15/2017  Today's Provider: Shirlee Latch, MD   I, Joslyn Hy, CMA, am acting as scribe for Shirlee Latch, MD.  Chief Complaint  Patient presents with  . URI   Subjective:    URI   This is a new problem. Episode onset: x 2 weeks. The problem has been gradually worsening. Maximum temperature: unknown if she has been febrile, but has noticed chills. Associated symptoms include abdominal pain, congestion, coughing (productive of green sputum), a plugged ear sensation (and drainage), rhinorrhea, sinus pain and sneezing. Pertinent negatives include no chest pain, ear pain, headaches, nausea, neck pain, sore throat, swollen glands, vomiting or wheezing. Treatments tried: Mucinex. Pt has also used [facility], that prescribed pt azithromycin x 3 tablets. The treatment provided no relief.   Sputum no longer green.  Has episodes of frequent coughing that is dry. This causes SOB, which is worrisome to her.  Thinks that T98.0 today must be low grade fever because her temperature runs lower than your average person.  Biggest problem is sinus pressure.  Has h/o multiple sinus infections.  This has been worsening.    No Known Allergies   Current Outpatient Medications:  .  amLODipine (NORVASC) 5 MG tablet, TAKE 1 TABLET BY MOUTH  EVERY DAY, Disp: 90 tablet, Rfl: 4 .  cyclobenzaprine (FLEXERIL) 10 MG tablet, Take 1 tablet (10 mg total) by mouth at bedtime., Disp: 15 tablet, Rfl: 0 .  fluticasone (FLONASE) 50 MCG/ACT nasal spray, Place 1 spray into both nostrils daily., Disp: 48 g, Rfl: 1 .  JANUVIA 100 MG tablet, TAKE 1 TABLET BY MOUTH  DAILY, Disp: 90 tablet, Rfl: 4 .  levothyroxine (SYNTHROID, LEVOTHROID) 25 MCG tablet, TAKE 1 TABLET BY MOUTH  DAILY BEFORE BREAKFAST.  TAKE 30 MINUTES BEFORE ANY  FOOD OR OTHER MEDICATIONS., Disp: 90 tablet, Rfl: 1 .  meloxicam (MOBIC) 15 MG tablet, TAKE 1  TABLET BY MOUTH  EVERY DAY, Disp: 90 tablet, Rfl: 1 .  metFORMIN (GLUCOPHAGE) 500 MG tablet, Take 2 tablets (1,000 mg total) by mouth daily with breakfast., Disp: 180 tablet, Rfl: 3 .  montelukast (SINGULAIR) 10 MG tablet, TAKE 1 TABLET BY MOUTH  DAILY, Disp: 90 tablet, Rfl: 4 .  ONE TOUCH ULTRA TEST test strip, CHECK BLOOD SUGAR BEFORE  MEALS AND AT BEDTIME, Disp: 400 each, Rfl: 3 .  ONETOUCH DELICA LANCETS 33G MISC, Check blood sugar before  meals and at bedtime, Disp: 400 each, Rfl: 3 .  quinapril-hydrochlorothiazide (ACCURETIC) 20-25 MG tablet, TAKE 1 TABLET BY MOUTH  DAILY, Disp: 90 tablet, Rfl: 3 .  simvastatin (ZOCOR) 10 MG tablet, TAKE 1 TABLET BY MOUTH  DAILY, Disp: 90 tablet, Rfl: 4 .  loratadine (CLARITIN) 10 MG tablet, Take 10 mg by mouth daily as needed. , Disp: , Rfl:   Review of Systems  HENT: Positive for congestion, rhinorrhea, sinus pain and sneezing. Negative for ear pain and sore throat.   Respiratory: Positive for cough (productive of green sputum). Negative for wheezing.   Cardiovascular: Negative for chest pain.  Gastrointestinal: Positive for abdominal pain. Negative for nausea and vomiting.  Musculoskeletal: Negative for neck pain.  Neurological: Negative for headaches.    Social History   Tobacco Use  . Smoking status: Never Smoker  . Smokeless tobacco: Never Used  Substance Use Topics  . Alcohol use: Yes    Comment:  occasional   Objective:   BP 140/72 (BP Location: Left Arm, Patient Position: Sitting, Cuff Size: Large)   Pulse 96   Temp 98 F (36.7 C) (Oral)   Resp 20   Wt 206 lb (93.4 kg)   LMP  (LMP Unknown)   SpO2 94%   BMI (P) 41.61 kg/m  Vitals:   08/15/17 1330  BP: 140/72  Pulse: 96  Resp: 20  Temp: 98 F (36.7 C)  TempSrc: Oral  SpO2: 94%  Weight: 206 lb (93.4 kg)     Physical Exam  Constitutional: She is oriented to person, place, and time. She appears well-developed and well-nourished. No distress.  HENT:  Head: Normocephalic  and atraumatic.  Right Ear: Tympanic membrane, external ear and ear canal normal.  Left Ear: Tympanic membrane, external ear and ear canal normal.  Nose: Nose normal. Right sinus exhibits no maxillary sinus tenderness and no frontal sinus tenderness. Left sinus exhibits no maxillary sinus tenderness and no frontal sinus tenderness.  Mouth/Throat: Oropharynx is clear and moist and mucous membranes are normal. No posterior oropharyngeal edema.  Eyes: Conjunctivae are normal. Pupils are equal, round, and reactive to light. No scleral icterus.  Neck: Neck supple. No thyromegaly present.  Cardiovascular: Normal rate, regular rhythm, normal heart sounds and intact distal pulses.  No murmur heard. Pulmonary/Chest: Effort normal and breath sounds normal. No respiratory distress. She has no wheezes. She has no rales.  Musculoskeletal: She exhibits no edema.  Lymphadenopathy:    She has no cervical adenopathy.  Neurological: She is alert and oriented to person, place, and time.  Skin: Skin is warm and dry. No rash noted.  Psychiatric: She has a normal mood and affect. Her behavior is normal.  Vitals reviewed.       Assessment & Plan:     1. Acute recurrent maxillary sinusitis - given duration of symptoms, treat empirically for secondary bacterial sinusitis - unsure of reason for 3d course of Azithromycin, but was not long neough course to treat sinusitis - will treat with 7d course of augmentin - discussed symptomatic management with mucinex, flonase - discussed return precautions  2. Viral URI - likely initial symptoms stem from viral URI - no evidence of CAP, AOM, strep pharyngitis - reassured patient that without fever, this is highly unlikely to be flu - discussed symptomatic management, natural course, post-viral cough, and return precautions   Meds ordered this encounter  Medications  . amoxicillin-clavulanate (AUGMENTIN) 875-125 MG tablet    Sig: Take 1 tablet by mouth 2 (two)  times daily for 7 days.    Dispense:  14 tablet    Refill:  0    Return if symptoms worsen or fail to improve.     The entirety of the information documented in the History of Present Illness, Review of Systems and Physical Exam were personally obtained by me. Portions of this information were initially documented by Irving BurtonEmily Ratchford, CMA and reviewed by me for thoroughness and accuracy.    Erasmo DownerBacigalupo, Terressa Evola M, MD, MPH [facility] 08/15/2017 1:55 PM

## 2017-08-29 ENCOUNTER — Other Ambulatory Visit: Payer: Self-pay | Admitting: Physician Assistant

## 2017-08-29 DIAGNOSIS — E119 Type 2 diabetes mellitus without complications: Secondary | ICD-10-CM

## 2017-08-29 DIAGNOSIS — I10 Essential (primary) hypertension: Secondary | ICD-10-CM

## 2017-08-29 MED ORDER — SITAGLIPTIN PHOSPHATE 100 MG PO TABS: 100.0000 mg | ORAL_TABLET | Freq: Every day | ORAL | 3 refills | Status: DC

## 2017-08-29 MED ORDER — AMLODIPINE BESYLATE 5 MG PO TABS: 5.0000 mg | ORAL_TABLET | Freq: Every day | ORAL | 3 refills | Status: DC

## 2017-08-29 NOTE — Telephone Encounter (Signed)
Pt contacted office for refill request on the following medications:  1. amLODipine (NORVASC) 5 MG tablet  2. JANUVIA 100 MG tablet  [facility] supply  [facility] [address]  Last Rx: 08/29/16 by Dr. Sherrie MustacheFisher LOV: 08/15/17 acute with Dr. Leonard SchwartzB 05/17/17 with Antony ContrasJenni  Please advise. Thanks TNP

## 2017-08-30 ENCOUNTER — Ambulatory Visit
Admission: RE | Admit: 2017-08-30 | Discharge: 2017-08-30 | Disposition: A | Discharge status: Home / Self Care | Payer: 59 | Source: Ambulatory Visit | Attending: Ophthalmology | Admitting: Ophthalmology

## 2017-08-30 DIAGNOSIS — H748X1 Other specified disorders of right middle ear and mastoid: Secondary | ICD-10-CM | POA: Diagnosis not present

## 2017-08-30 DIAGNOSIS — G5131 Clonic hemifacial spasm, right: Secondary | ICD-10-CM | POA: Diagnosis not present

## 2017-08-30 DIAGNOSIS — G5139 Clonic hemifacial spasm, unspecified: Secondary | ICD-10-CM | POA: Diagnosis present

## 2017-08-30 LAB — POCT I-STAT CREATININE: Creatinine, Ser: 0.8 mg/dL (ref 0.44–1.00)

## 2017-08-30 MED ORDER — GADOBENATE DIMEGLUMINE 529 MG/ML IV SOLN
20.0000 mL | Freq: Once | INTRAVENOUS | Status: AC | PRN
  Administered 2017-08-30: 19 mL via INTRAVENOUS

## 2017-09-13 ENCOUNTER — Telehealth: Payer: Self-pay | Admitting: Physician Assistant

## 2017-09-13 DIAGNOSIS — M62830 Muscle spasm of back: Secondary | ICD-10-CM

## 2017-09-13 DIAGNOSIS — G245 Blepharospasm: Secondary | ICD-10-CM

## 2017-09-13 MED ORDER — CYCLOBENZAPRINE HCL 10 MG PO TABS: 10.0000 mg | ORAL_TABLET | Freq: Every day | ORAL | 0 refills | Status: DC

## 2017-09-13 NOTE — Addendum Note (Signed)
Addended by: Margaretann LovelessBURNETTE, JENNIFER M on: 09/13/2017 10:16 AM   Modules accepted: Orders

## 2017-09-13 NOTE — Telephone Encounter (Signed)
Patient is wanting to know if you can send something in to relax her face to help with the twitch in her eye.  She states that she has a appointment to get botox for this but could not be seen until 10/13/2017.  [place] [facility] [place].

## 2017-09-13 NOTE — Telephone Encounter (Signed)
Sent in flexeril 

## 2018-01-03 ENCOUNTER — Other Ambulatory Visit: Payer: Self-pay | Admitting: Physician Assistant

## 2018-01-03 DIAGNOSIS — E039 Hypothyroidism, unspecified: Secondary | ICD-10-CM

## 2018-01-13 ENCOUNTER — Other Ambulatory Visit: Payer: Self-pay | Admitting: Family Medicine

## 2018-01-13 DIAGNOSIS — E119 Type 2 diabetes mellitus without complications: Secondary | ICD-10-CM

## 2018-01-13 DIAGNOSIS — I10 Essential (primary) hypertension: Secondary | ICD-10-CM

## 2018-02-10 ENCOUNTER — Ambulatory Visit
Admission: RE | Admit: 2018-02-10 | Discharge: 2018-02-10 | Disposition: A | Discharge status: Home / Self Care | Payer: 59 | Source: Ambulatory Visit | Attending: Physician Assistant | Admitting: Physician Assistant

## 2018-02-10 ENCOUNTER — Encounter: Payer: Self-pay | Admitting: Physician Assistant

## 2018-02-10 ENCOUNTER — Ambulatory Visit: Payer: 59 | Admitting: Physician Assistant

## 2018-02-10 VITALS — BP 150/90 | HR 102 | Temp 98.6°F | Resp 16 | Wt 196.2 lb

## 2018-02-10 DIAGNOSIS — R1032 Left lower quadrant pain: Secondary | ICD-10-CM

## 2018-02-10 DIAGNOSIS — R197 Diarrhea, unspecified: Secondary | ICD-10-CM | POA: Insufficient documentation

## 2018-02-10 DIAGNOSIS — R1012 Left upper quadrant pain: Secondary | ICD-10-CM | POA: Diagnosis not present

## 2018-02-10 DIAGNOSIS — E119 Type 2 diabetes mellitus without complications: Secondary | ICD-10-CM | POA: Diagnosis not present

## 2018-02-10 DIAGNOSIS — K8020 Calculus of gallbladder without cholecystitis without obstruction: Secondary | ICD-10-CM | POA: Diagnosis not present

## 2018-02-10 MED ORDER — SULFAMETHOXAZOLE-TRIMETHOPRIM 800-160 MG PO TABS: 1.0000 | ORAL_TABLET | Freq: Two times a day (BID) | ORAL | 0 refills | Status: DC

## 2018-02-10 MED ORDER — ONETOUCH VERIO FLEX SYSTEM W/DEVICE KIT: PACK | 0 refills | Status: DC

## 2018-02-10 NOTE — Patient Instructions (Signed)

## 2018-02-10 NOTE — Progress Notes (Signed)
Patient: Christine Clarke Female    DOB: 06-20-1951   67 y.o.   MRN: [id number] Visit Date: 02/10/2018  Today's Provider: Mar Daring, PA-C   Chief Complaint  Patient presents with  . Abdominal Pain   Subjective:    HPI  Patient here with c/o stomach pain, left upper quadrant and left lower quadrant. Reports had pain in January but it was very intermittent. Pain increased a month ago but now is worsening over the last week and having associated diarrhea (loose, watery stools). Had 5 loose, watery BM today. Pain improves after BM. Reports is more uncomfortable and she feels knots in the area that hurts. She is having some nausea and has lost some weight. She denies vomiting, fevers, melena or hematochezia. Treatment tried: none. Last colonoscopy was in 2012.      No Known Allergies   Current Outpatient Medications:  .  amLODipine (NORVASC) 5 MG tablet, TAKE 1 TABLET BY MOUTH  EVERY DAY, Disp: 90 tablet, Rfl: 4 .  fluticasone (FLONASE) 50 MCG/ACT nasal spray, Place 1 spray into both nostrils daily., Disp: 48 g, Rfl: 1 .  JANUVIA 100 MG tablet, TAKE 1 TABLET BY MOUTH  DAILY, Disp: 90 tablet, Rfl: 4 .  meloxicam (MOBIC) 15 MG tablet, TAKE 1 TABLET BY MOUTH  EVERY DAY, Disp: 90 tablet, Rfl: 1 .  metFORMIN (GLUCOPHAGE) 500 MG tablet, Take 2 tablets (1,000 mg total) by mouth daily with breakfast., Disp: 180 tablet, Rfl: 3 .  montelukast (SINGULAIR) 10 MG tablet, TAKE 1 TABLET BY MOUTH  DAILY, Disp: 90 tablet, Rfl: 4 .  ONE TOUCH ULTRA TEST test strip, CHECK BLOOD SUGAR BEFORE  MEALS AND AT BEDTIME, Disp: 400 each, Rfl: 3 .  ONETOUCH DELICA LANCETS 99B MISC, Check blood sugar before  meals and at bedtime, Disp: 400 each, Rfl: 3 .  quinapril-hydrochlorothiazide (ACCURETIC) 20-25 MG tablet, TAKE 1 TABLET BY MOUTH  DAILY, Disp: 90 tablet, Rfl: 3 .  simvastatin (ZOCOR) 10 MG tablet, TAKE 1 TABLET BY MOUTH  DAILY, Disp: 90 tablet, Rfl: 4 .  cyclobenzaprine (FLEXERIL) 10 MG tablet,  Take 1 tablet (10 mg total) by mouth at bedtime. (Patient not taking: Reported on 02/10/2018), Disp: 15 tablet, Rfl: 0 .  levothyroxine (SYNTHROID, LEVOTHROID) 25 MCG tablet, TAKE 1 TABLET BY MOUTH  DAILY BEFORE BREAKFAST.  TAKE 30 MINUTES BEFORE ANY  FOOD OR OTHER MEDICATIONS. (Patient not taking: Reported on 02/10/2018), Disp: 90 tablet, Rfl: 1 .  loratadine (CLARITIN) 10 MG tablet, Take 10 mg by mouth daily as needed. , Disp: , Rfl:   Review of Systems  Constitutional: Positive for appetite change, chills and fatigue. Negative for activity change and fever.  HENT: Positive for sinus pain.   Respiratory: Positive for shortness of breath (off and on when she walks; chronic).   Cardiovascular: Negative for chest pain, palpitations and leg swelling.  Gastrointestinal: Positive for abdominal pain, diarrhea and nausea. Negative for abdominal distention, anal bleeding, blood in stool, constipation, rectal pain and vomiting.  Genitourinary: Positive for dysuria. Negative for hematuria.  Musculoskeletal: Negative for back pain.  Neurological: Positive for weakness.    Social History   Tobacco Use  . Smoking status: Never Smoker  . Smokeless tobacco: Never Used  Substance Use Topics  . Alcohol use: Yes    Comment: occasional   Objective:   BP (!) 150/90 (BP Location: Left Arm, Patient Position: Sitting, Cuff Size: Normal)   Pulse (!) 102  Temp 98.6 F (37 C) (Oral)   Resp 16   Wt 196 lb 3.2 oz (89 kg)   LMP  (LMP Unknown)   SpO2 96%   BMI (P) 39.63 kg/m  Vitals:   02/10/18 1445  BP: (!) 150/90  Pulse: (!) 102  Resp: 16  Temp: 98.6 F (37 C)  TempSrc: Oral  SpO2: 96%  Weight: 196 lb 3.2 oz (89 kg)     Physical Exam  Constitutional: She is oriented to person, place, and time. She appears well-developed and well-nourished. No distress.  Cardiovascular: Normal rate, regular rhythm and normal heart sounds. Exam reveals no gallop and no friction rub.  No murmur  heard. Pulmonary/Chest: Effort normal and breath sounds normal. No respiratory distress. She has no wheezes. She has no rales.  Abdominal: Soft. Normal appearance and bowel sounds are normal. She exhibits no distension and no mass. There is no hepatosplenomegaly. There is tenderness in the left upper quadrant and left lower quadrant. There is no rebound, no guarding and no CVA tenderness.  Neurological: She is alert and oriented to person, place, and time.  Skin: Skin is warm and dry. She is not diaphoretic.       Assessment & Plan:     1. LLQ pain DDx: Diverticulitis, constipation, infectious diarrhea, IBS-D. Will get imaging as below to r/o constipation. Bactrim given for diverticulitis (highest on my differential so treating empirically over the weekend). Labs ordered as below. Stool studies ordered. I will f/u pending results. If xray is normal will order CT abd/pelvis. She is in agreement with current plan. I will see her back in 1-2 weeks. She is to call if symptoms worsen in the meantime.  - DG Abd 2 Views; Future - CBC w/Diff/Platelet - Comprehensive Metabolic Panel (CMET) - Lipase - Stool Culture - Stool C-Diff Toxin Assay - sulfamethoxazole-trimethoprim (BACTRIM DS,SEPTRA DS) 800-160 MG tablet; Take 1 tablet by mouth 2 (two) times daily.  Dispense: 14 tablet; Refill: 0  2. LUQ pain See above medical treatment plan. - DG Abd 2 Views; Future - CBC w/Diff/Platelet - Comprehensive Metabolic Panel (CMET) - Lipase - Stool Culture - Stool C-Diff Toxin Assay - sulfamethoxazole-trimethoprim (BACTRIM DS,SEPTRA DS) 800-160 MG tablet; Take 1 tablet by mouth 2 (two) times daily.  Dispense: 14 tablet; Refill: 0  3. Diarrhea, unspecified type See above medical treatment plan. - DG Abd 2 Views; Future - CBC w/Diff/Platelet - Comprehensive Metabolic Panel (CMET) - Lipase - Stool Culture - Stool C-Diff Toxin Assay - sulfamethoxazole-trimethoprim (BACTRIM DS,SEPTRA DS) 800-160 MG tablet;  Take 1 tablet by mouth 2 (two) times daily.  Dispense: 14 tablet; Refill: 0  4. Diabetes mellitus without complication (HCC) Lost meter. It was ordered as below.  - Blood Glucose Monitoring Suppl (Bajandas) w/Device KIT; To check BG once daily  Dispense: 1 kit; Refill: 0       Mar Daring, PA-C  [facility]

## 2018-02-11 LAB — CBC WITH DIFFERENTIAL/PLATELET
BASOS: 1 %
Basophils Absolute: 0 10*3/uL (ref 0.0–0.2)
EOS (ABSOLUTE): 0.1 10*3/uL (ref 0.0–0.4)
Eos: 1 %
Hematocrit: 44.7 % (ref 34.0–46.6)
Hemoglobin: 15.2 g/dL (ref 11.1–15.9)
Immature Grans (Abs): 0 10*3/uL (ref 0.0–0.1)
Immature Granulocytes: 0 %
LYMPHS ABS: 2.2 10*3/uL (ref 0.7–3.1)
Lymphs: 26 %
MCH: 31 pg (ref 26.6–33.0)
MCHC: 34 g/dL (ref 31.5–35.7)
MCV: 91 fL (ref 79–97)
MONOS ABS: 0.5 10*3/uL (ref 0.1–0.9)
Monocytes: 6 %
NEUTROS ABS: 5.7 10*3/uL (ref 1.4–7.0)
Neutrophils: 66 %
PLATELETS: 185 10*3/uL (ref 150–450)
RBC: 4.9 x10E6/uL (ref 3.77–5.28)
RDW: 13 % (ref 12.3–15.4)
WBC: 8.6 10*3/uL (ref 3.4–10.8)

## 2018-02-11 LAB — COMPREHENSIVE METABOLIC PANEL
A/G RATIO: 1.6 (ref 1.2–2.2)
ALK PHOS: 65 IU/L (ref 39–117)
ALT: 40 IU/L — AB (ref 0–32)
AST: 21 IU/L (ref 0–40)
Albumin: 4.4 g/dL (ref 3.6–4.8)
BILIRUBIN TOTAL: 0.4 mg/dL (ref 0.0–1.2)
BUN/Creatinine Ratio: 25 (ref 12–28)
BUN: 24 mg/dL (ref 8–27)
CHLORIDE: 102 mmol/L (ref 96–106)
CO2: 20 mmol/L (ref 20–29)
Calcium: 10.2 mg/dL (ref 8.7–10.3)
Creatinine, Ser: 0.95 mg/dL (ref 0.57–1.00)
GFR calc Af Amer: 71 mL/min/{1.73_m2} (ref >59)
GFR calc non Af Amer: 61 mL/min/{1.73_m2} (ref >59)
Globulin, Total: 2.7 g/dL (ref 1.5–4.5)
Glucose: 186 mg/dL — ABNORMAL HIGH (ref 65–99)
POTASSIUM: 3.8 mmol/L (ref 3.5–5.2)
SODIUM: 143 mmol/L (ref 134–144)
Total Protein: 7.1 g/dL (ref 6.0–8.5)

## 2018-02-11 LAB — LIPASE: Lipase: 34 U/L (ref 14–72)

## 2018-02-13 ENCOUNTER — Telehealth: Payer: Self-pay

## 2018-02-13 NOTE — Telephone Encounter (Signed)
-----   Message from Margaretann LovelessJennifer M Burnette, PA-C sent at 02/13/2018  8:55 AM EDT ----- Abdominal xray is essentially unremarkable. No constipation. There is small amounts of bowel gas noted c/w diarrhea. There are 2 large gallstones noted as well. This may be cause of the upper abdominal pain, but should not cause diarrhea. Are you still having diarrhea?

## 2018-02-13 NOTE — Telephone Encounter (Signed)
-----   Message from Margaretann LovelessJennifer M Burnette, PA-C sent at 02/13/2018  8:56 AM EDT ----- Labs are unremarkable and fairly normal.

## 2018-02-13 NOTE — Telephone Encounter (Signed)
Patient advised as directed below.  Thanks,  -Joseline 

## 2018-02-13 NOTE — Telephone Encounter (Signed)
Pt returned call ° °teri °

## 2018-02-13 NOTE — Telephone Encounter (Signed)
No answer/mailbox is full.  Thanks,  -Tanashia Ciesla 

## 2018-02-15 LAB — CLOSTRIDIUM DIFFICILE EIA: C difficile Toxins A+B, EIA: NEGATIVE

## 2018-02-16 ENCOUNTER — Ambulatory Visit: Payer: 59 | Admitting: Physician Assistant

## 2018-02-16 ENCOUNTER — Encounter: Payer: Self-pay | Admitting: Physician Assistant

## 2018-02-16 VITALS — BP 148/96 | HR 99 | Temp 98.3°F | Wt 195.4 lb

## 2018-02-16 DIAGNOSIS — E119 Type 2 diabetes mellitus without complications: Secondary | ICD-10-CM

## 2018-02-16 DIAGNOSIS — S81802A Unspecified open wound, left lower leg, initial encounter: Secondary | ICD-10-CM

## 2018-02-16 DIAGNOSIS — L519 Erythema multiforme, unspecified: Secondary | ICD-10-CM | POA: Diagnosis not present

## 2018-02-16 MED ORDER — GLUCOSE BLOOD VI STRP: ORAL_STRIP | 12 refills | Status: DC

## 2018-02-16 NOTE — Progress Notes (Signed)
Patient: Christine Clarke Female    DOB: 11/25/1950   67 y.o.   MRN: [id number] Visit Date: 02/16/2018  Today's Provider: Mar Daring, PA-C   Chief Complaint  Patient presents with  . Possible insect bite   Subjective:    HPI Patient presents today C/O possible spider bite on the back upper left thigh area. She states the area is red, painful and draining. She reports she noticed the area on Saturday, and  the symptoms are gradually worsening.    Patient would like to discuss labs and stool test done on 02/10/18 and 02/12/18. She is also requesting a RX for test strips.    No Known Allergies   Current Outpatient Medications:  .  amLODipine (NORVASC) 5 MG tablet, TAKE 1 TABLET BY MOUTH  EVERY DAY, Disp: 90 tablet, Rfl: 4 .  Blood Glucose Monitoring Suppl (ONETOUCH VERIO FLEX SYSTEM) w/Device KIT, To check BG once daily, Disp: 1 kit, Rfl: 0 .  fluticasone (FLONASE) 50 MCG/ACT nasal spray, Place 1 spray into both nostrils daily., Disp: 48 g, Rfl: 1 .  JANUVIA 100 MG tablet, TAKE 1 TABLET BY MOUTH  DAILY, Disp: 90 tablet, Rfl: 4 .  loratadine (CLARITIN) 10 MG tablet, Take 10 mg by mouth daily as needed. , Disp: , Rfl:  .  meloxicam (MOBIC) 15 MG tablet, TAKE 1 TABLET BY MOUTH  EVERY DAY, Disp: 90 tablet, Rfl: 1 .  metFORMIN (GLUCOPHAGE) 500 MG tablet, Take 2 tablets (1,000 mg total) by mouth daily with breakfast., Disp: 180 tablet, Rfl: 3 .  montelukast (SINGULAIR) 10 MG tablet, TAKE 1 TABLET BY MOUTH  DAILY, Disp: 90 tablet, Rfl: 4 .  ONE TOUCH ULTRA TEST test strip, CHECK BLOOD SUGAR BEFORE  MEALS AND AT BEDTIME, Disp: 400 each, Rfl: 3 .  ONETOUCH DELICA LANCETS 72C MISC, Check blood sugar before  meals and at bedtime, Disp: 400 each, Rfl: 3 .  quinapril-hydrochlorothiazide (ACCURETIC) 20-25 MG tablet, TAKE 1 TABLET BY MOUTH  DAILY, Disp: 90 tablet, Rfl: 3 .  simvastatin (ZOCOR) 10 MG tablet, TAKE 1 TABLET BY MOUTH  DAILY, Disp: 90 tablet, Rfl: 4 .   sulfamethoxazole-trimethoprim (BACTRIM DS,SEPTRA DS) 800-160 MG tablet, Take 1 tablet by mouth 2 (two) times daily., Disp: 14 tablet, Rfl: 0 .  cyclobenzaprine (FLEXERIL) 10 MG tablet, Take 1 tablet (10 mg total) by mouth at bedtime. (Patient not taking: Reported on 02/10/2018), Disp: 15 tablet, Rfl: 0 .  levothyroxine (SYNTHROID, LEVOTHROID) 25 MCG tablet, TAKE 1 TABLET BY MOUTH  DAILY BEFORE BREAKFAST.  TAKE 30 MINUTES BEFORE ANY  FOOD OR OTHER MEDICATIONS. (Patient not taking: Reported on 02/10/2018), Disp: 90 tablet, Rfl: 1  Review of Systems  Constitutional: Negative.   Respiratory: Negative.   Cardiovascular: Negative.   Skin: Positive for rash.       Redness and drainage on back of upper left thigh area     Social History   Tobacco Use  . Smoking status: Never Smoker  . Smokeless tobacco: Never Used  Substance Use Topics  . Alcohol use: Yes    Comment: occasional   Objective:   BP (!) 148/96 (BP Location: Left Arm, Patient Position: Sitting, Cuff Size: Normal)   Pulse 99   Temp 98.3 F (36.8 C) (Oral)   Wt 195 lb 6.4 oz (88.6 kg)   LMP  (LMP Unknown)   SpO2 96%   BMI (P) 39.47 kg/m  Vitals:   02/16/18 1510  BP: Marland Kitchen)  148/96  Pulse: 99  Temp: 98.3 F (36.8 C)  TempSrc: Oral  SpO2: 96%  Weight: 195 lb 6.4 oz (88.6 kg)     Physical Exam  Constitutional: She appears well-developed and well-nourished. No distress.  HENT:  Mouth/Throat: Oropharynx is clear and moist and mucous membranes are normal. Oral lesions present.    Neck: Normal range of motion. Neck supple.  Cardiovascular: Normal rate, regular rhythm and normal heart sounds. Exam reveals no gallop and no friction rub.  No murmur heard. Pulmonary/Chest: Effort normal and breath sounds normal. No respiratory distress. She has no wheezes. She has no rales.  Skin: Rash noted. Rash is macular. She is not diaphoretic. There is erythema.     Vitals reviewed.       Assessment & Plan:     1. Erythema  multiforme Secondary to bactrim. Medication stopped. Call if no improvement in lesions. Warning signs of SJS discussed.   2. Wound of left lower extremity, initial encounter Culture obtained to make sure not MRSA. Primary diagnosis suspected as main cause.  - Aerobic culture  3. Type 2 diabetes mellitus without complication, without long-term current use of insulin (HCC) Strips ordered as below. - glucose blood (ONETOUCH VERIO) test strip; To check blood glucose daily  Dispense: 100 each; Refill: [place], PA-C  [facility]

## 2018-02-16 NOTE — Patient Instructions (Signed)
Erythema Multiforme Erythema multiforme is a rash that usually occurs on the skin, but can also occur on the lips and on the inside of the mouth. It is usually a mild condition that goes away on its own. It most often affects young adults and children. The rash shows up suddenly and often lasts 1-4 weeks. In some cases, the rash may come back again after clearing up. What are the causes? The cause of erythema multiforme may be an overreaction by the body's immune system to a trigger. Common triggers include:  Infection, most commonly by the cold sore virus (human herpes virus, HSV), bacteria, or fungus.  Less common triggers include:  Medicines.  Other illnesses.  In some cases, the cause may not be known. What are the signs or symptoms? The rash from erythema multiforme shows up suddenly. It may appear days after exposure to the trigger. It may start as small, red, round or oval marks that become bumps or raised welts over 24-48 hours. These bumps may resemble a target or a "bull's eye." These can spread and be quite large (about 1 inch [2.5 cm]). There may be mild itching or burning of the skin at first. These skin changes usually appear first on the backs of the hands. They may then spread to the tops of the feet, the arms, the elbows, the knees, the palms, and the soles of the feet. There may be a mild rash on the lips and lining of the mouth. The skin rash may show up in waves over a few days. It may take 2-4 weeks for the rash to go away. The rash may return at a later time. How is this diagnosed? Diagnosis of erythema multiforme is usually made based on a physical exam and medical history. To help confirm the diagnosis, a small piece of skin tissue is sometimes removed (skin biopsy) so it can be examined under a microscope by a specialist (pathologist). How is this treated? Most episodes of erythema multiforme heal on their own. Treatment may not be needed. Your health care provider will  recommend removing or avoiding the trigger if possible. If the trigger is an infection or other illness, you may receive treatment for that infection or illness. You may also be given medicine for itching. Other medicines may be used for severe cases or to help prevent repeat bouts of erythema multiforme. Follow these instructions at home:  Take medicines only as directed by your health care provider.  If possible, avoid known triggers.  If a medicine was your trigger, be sure to notify all of your health care providers. You should avoid this medicine or any like it in the future.  If your trigger was a herpes virus infection, use sunscreen lotion and sunscreen-containing lip balm to prevent sunlight triggered outbreaks of herpes virus.  Apply moist compresses as needed to help control itching. Cool or warm baths may also help. Avoid hot baths or showers.  Eat soft foods if you have mouth sores.  Keep all follow-up visits as directed by your health care provider. This is important. Contact a health care provider if:  Your rash shows up again in the future.  You have a fever. Get help right away if:  You develop redness and swelling on your lips or in your mouth.  You have a burning feeling on your lips or in your mouth.  You develop blisters or open sores on your mouth, lips, vagina, penis, or anus.  You have eye pain,   or you have redness or drainage in your eye.  You develop blisters on your skin.  You have difficulty breathing.  You have difficulty swallowing, or you start drooling.  You have blood in your urine.  You have pain with urination. This information is not intended to replace advice given to you by your health care provider. Make sure you discuss any questions you have with your health care provider. Document Released: 07/19/2005 Document Revised: 12/25/2015 Document Reviewed: 03/12/2014 Elsevier Interactive Patient Education  2018 [employer]  

## 2018-02-17 LAB — STOOL CULTURE: E coli, Shiga toxin Assay: NEGATIVE

## 2018-02-19 LAB — AEROBIC CULTURE

## 2018-02-20 ENCOUNTER — Telehealth: Payer: Self-pay

## 2018-02-20 NOTE — Telephone Encounter (Signed)
-----   Message from Margaretann LovelessJennifer M Burnette, PA-C sent at 02/19/2018  8:52 PM EDT ----- Wound culture is negative for bacteria. Are symptoms improving with stopping Bactrim?

## 2018-02-20 NOTE — Telephone Encounter (Signed)
Patient advised as directed below.  Thanks,  -Sarah Baez 

## 2018-03-03 ENCOUNTER — Other Ambulatory Visit: Payer: Self-pay | Admitting: Physician Assistant

## 2018-03-03 DIAGNOSIS — E119 Type 2 diabetes mellitus without complications: Secondary | ICD-10-CM

## 2018-03-03 DIAGNOSIS — M1990 Unspecified osteoarthritis, unspecified site: Secondary | ICD-10-CM

## 2018-03-03 DIAGNOSIS — I10 Essential (primary) hypertension: Secondary | ICD-10-CM

## 2018-03-08 ENCOUNTER — Encounter: Payer: Self-pay | Admitting: Physician Assistant

## 2018-03-08 ENCOUNTER — Ambulatory Visit (INDEPENDENT_AMBULATORY_CARE_PROVIDER_SITE_OTHER): Payer: 59 | Admitting: Physician Assistant

## 2018-03-08 VITALS — BP 120/86 | HR 91 | Temp 98.1°F | Resp 16 | Ht 59.0 in | Wt 199.0 lb

## 2018-03-08 DIAGNOSIS — I10 Essential (primary) hypertension: Secondary | ICD-10-CM

## 2018-03-08 DIAGNOSIS — E039 Hypothyroidism, unspecified: Secondary | ICD-10-CM

## 2018-03-08 DIAGNOSIS — Z0000 Encounter for general adult medical examination without abnormal findings: Secondary | ICD-10-CM | POA: Diagnosis not present

## 2018-03-08 DIAGNOSIS — E119 Type 2 diabetes mellitus without complications: Secondary | ICD-10-CM | POA: Diagnosis not present

## 2018-03-08 DIAGNOSIS — Z13820 Encounter for screening for osteoporosis: Secondary | ICD-10-CM

## 2018-03-08 DIAGNOSIS — Z1159 Encounter for screening for other viral diseases: Secondary | ICD-10-CM

## 2018-03-08 DIAGNOSIS — Z1231 Encounter for screening mammogram for malignant neoplasm of breast: Secondary | ICD-10-CM

## 2018-03-08 DIAGNOSIS — Z6841 Body Mass Index (BMI) 40.0 and over, adult: Secondary | ICD-10-CM

## 2018-03-08 DIAGNOSIS — Z1239 Encounter for other screening for malignant neoplasm of breast: Secondary | ICD-10-CM

## 2018-03-08 DIAGNOSIS — E7800 Pure hypercholesterolemia, unspecified: Secondary | ICD-10-CM

## 2018-03-08 LAB — POCT UA - MICROALBUMIN: Microalbumin Ur, POC: 50 mg/L

## 2018-03-08 NOTE — Progress Notes (Signed)
Patient: Christine Clarke, Female    DOB: [birth year], 67 y.o.   MRN: [id number] Visit Date: 03/08/2018  Today's Provider: Mar Daring, PA-C   Chief Complaint  Patient presents with  . Medicare Wellness   Subjective:    Annual wellness visit Christine Clarke is a 67 y.o. female. She feels well. She reports exercising none. She reports she is sleeping well.  12/09/15 CPE 12/09/15 Pap-neg;HPV-neg 05/25/16 Mammogram-BI-RADS 1 09/29/15 Colonoscopy-polyps  -----------------------------------------------------------   Review of Systems  Constitutional: Negative.   HENT: Negative.   Eyes: Negative.   Respiratory: Positive for shortness of breath.   Cardiovascular: Negative.   Gastrointestinal: Negative.   Endocrine: Negative.   Genitourinary: Negative.   Musculoskeletal: Negative.   Skin: Positive for rash.  Allergic/Immunologic: Negative.   Neurological: Positive for facial asymmetry.  Hematological: Negative.   Psychiatric/Behavioral: The patient is nervous/anxious.     Social History   Socioeconomic History  . Marital status: Divorced    Spouse name: Not on file  . Number of children: Not on file  . Years of education: Not on file  . Highest education level: Not on file  Occupational History  . Not on file  Social Needs  . Financial resource strain: Not on file  . Food insecurity:    Worry: Not on file    Inability: Not on file  . Transportation needs:    Medical: Not on file    Non-medical: Not on file  Tobacco Use  . Smoking status: Never Smoker  . Smokeless tobacco: Never Used  Substance and Sexual Activity  . Alcohol use: Yes    Comment: occasional  . Drug use: No  . Sexual activity: Not on file  Lifestyle  . Physical activity:    Days per week: Not on file    Minutes per session: Not on file  . Stress: Not on file  Relationships  . Social connections:    Talks on phone: Not on file    Gets together: Not on file    Attends religious  service: Not on file    Active member of club or organization: Not on file    Attends meetings of clubs or organizations: Not on file    Relationship status: Not on file  . Intimate partner violence:    Fear of current or ex partner: Not on file    Emotionally abused: Not on file    Physically abused: Not on file    Forced sexual activity: Not on file  Other Topics Concern  . Not on file  Social History Narrative  . Not on file    Past Medical History:  Diagnosis Date  . Allergy   . Diabetes mellitus without complication ([place])   . Hyperlipidemia   . Hypertension      Patient Active Problem List   Diagnosis Date Noted  . Metabolic syndrome 40/98/1191  . Diabetes mellitus without complication ([place]) 47/82/9562  . Eustachian tube dysfunction 07/18/2015  . BMI 40.0-44.9, adult ([place]) 07/17/2015  . Hot flash, menopausal 07/17/2015  . Disease of eyelid 07/17/2015  . Arthritis, degenerative 07/17/2015  . Skin nodule 07/17/2015  . Avitaminosis D 05/07/2015  . HLD (hyperlipidemia) 05/07/2015  . Allergic rhinitis, seasonal 03/19/2009  . Hypothyroidism 03/19/2009  . Low back pain 02/24/2009  . 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 Essential (primary) hypertension 05/18/2000    Past Surgical History:  Procedure Laterality Date  . ABDOMINAL HYSTERECTOMY  1981  partial  . BLADDER SURGERY  1981    Her family history includes Hypertension in her mother.      Current Outpatient Medications:  .  amLODipine (NORVASC) 5 MG tablet, TAKE 1 TABLET BY MOUTH  EVERY DAY, Disp: 90 tablet, Rfl: 4 .  Blood Glucose Monitoring Suppl (ONETOUCH VERIO FLEX SYSTEM) w/Device KIT, To check BG once daily, Disp: 1 kit, Rfl: 0 .  fluticasone (FLONASE) 50 MCG/ACT nasal spray, Place 1 spray into both nostrils daily., Disp: 48 g, Rfl: 1 .  glucose blood (ONETOUCH VERIO) test strip, To check blood glucose daily, Disp: 100 each, Rfl: 12 .  JANUVIA 100 MG tablet, TAKE 1 TABLET BY MOUTH   DAILY, Disp: 90 tablet, Rfl: 4 .  levothyroxine (SYNTHROID, LEVOTHROID) 25 MCG tablet, TAKE 1 TABLET BY MOUTH  DAILY BEFORE BREAKFAST.  TAKE 30 MINUTES BEFORE ANY  FOOD OR OTHER MEDICATIONS., Disp: 90 tablet, Rfl: 1 .  meloxicam (MOBIC) 15 MG tablet, TAKE 1 TABLET BY MOUTH  EVERY DAY, Disp: 90 tablet, Rfl: 1 .  metFORMIN (GLUCOPHAGE) 500 MG tablet, TAKE 2 TABLETS BY MOUTH  DAILY WITH BREAKFAST, Disp: 180 tablet, Rfl: 3 .  montelukast (SINGULAIR) 10 MG tablet, TAKE 1 TABLET BY MOUTH  DAILY, Disp: 90 tablet, Rfl: 4 .  ONETOUCH DELICA LANCETS 03U MISC, Check blood sugar before  meals and at bedtime, Disp: 400 each, Rfl: 3 .  quinapril-hydrochlorothiazide (ACCURETIC) 20-25 MG tablet, TAKE 1 TABLET BY MOUTH  DAILY, Disp: 90 tablet, Rfl: 3 .  simvastatin (ZOCOR) 10 MG tablet, TAKE 1 TABLET BY MOUTH  DAILY, Disp: 90 tablet, Rfl: 4  Patient Care Team: Mar Daring, PA-C as PCP - General (Family Medicine)     Objective:   Vitals: BP 120/86 (BP Location: Left Arm, Patient Position: Sitting, Cuff Size: Normal)   Pulse 91   Temp 98.1 F (36.7 C) (Oral)   Resp 16   Ht 4' 11"  (1.499 m)   Wt 199 lb (90.3 kg)   LMP  (LMP Unknown)   SpO2 96%   BMI 40.19 kg/m   Physical Exam  Constitutional: She is oriented to person, place, and time. She appears well-developed and well-nourished. No distress.  HENT:  Head: Normocephalic and atraumatic.  Right Ear: Hearing, tympanic membrane, external ear and ear canal normal.  Left Ear: Hearing, tympanic membrane, external ear and ear canal normal.  Nose: Nose normal.  Mouth/Throat: Uvula is midline, oropharynx is clear and moist and mucous membranes are normal. No oropharyngeal exudate.  Eyes: Pupils are equal, round, and reactive to light. Conjunctivae and EOM are normal. Right eye exhibits no discharge. Left eye exhibits no discharge. No scleral icterus.  Neck: Normal range of motion. Neck supple. No JVD present. Carotid bruit is not present. No  tracheal deviation present. No thyromegaly present.  Cardiovascular: Normal rate, regular rhythm, normal heart sounds and intact distal pulses. Exam reveals no gallop and no friction rub.  No murmur heard. Pulmonary/Chest: Effort normal and breath sounds normal. No respiratory distress. She has no wheezes. She has no rales. She exhibits no tenderness. Right breast exhibits no inverted nipple, no mass, no nipple discharge, no skin change and no tenderness. Left breast exhibits no inverted nipple, no mass, no nipple discharge, no skin change and no tenderness. No breast swelling, tenderness, discharge or bleeding. Breasts are symmetrical.  Abdominal: Soft. Bowel sounds are normal. She exhibits no distension and no mass. There is no tenderness. There is no rebound and no guarding.  Musculoskeletal: Normal range of motion. She exhibits no edema or tenderness.  Lymphadenopathy:    She has no cervical adenopathy.  Neurological: She is alert and oriented to person, place, and time.  Skin: Skin is warm and dry. No rash noted. She is not diaphoretic.  Psychiatric: She has a normal mood and affect. Her behavior is normal. Judgment and thought content normal.  Vitals reviewed.   Activities of Daily Living In your present state of health, do you have any difficulty performing the following activities: 03/08/2018  Hearing? Y  Vision? Y  Difficulty concentrating or making decisions? N  Walking or climbing stairs? N  Dressing or bathing? N  Doing errands, shopping? N  Some recent data might be hidden    Fall Risk Assessment Fall Risk  03/08/2018 02/09/2017 12/09/2015  Falls in the past year? No No Yes  Number falls in past yr: - - 1  Injury with Fall? - - No     Depression Screen PHQ 2/9 Scores 03/08/2018 02/09/2017 12/09/2015  PHQ - 2 Score 0 0 0  PHQ- 9 Score 1 - -    Cognitive Testing - 6-CIT  Correct? Score   What year is it? yes 0 0 or 4  What month is it? yes 0 0 or 3  Memorize:    Pia Mau,  40,  [address]  [place],      What time is it? (within 1 hour) yes 0 0 or 3  Count backwards from 20 yes 0 0, 2, or 4  Name the months of the year yes 0 0, 2, or 4  Repeat name & address above no 4 0, 2, 4, 6, 8, or 10       TOTAL SCORE  4/28   Interpretation:  Normal  Normal (0-7) Abnormal (8-28)       Assessment & Plan:     Annual Wellness Visit  Reviewed patient's Family Medical History Reviewed and updated list of patient's medical providers Assessment of cognitive impairment was done Assessed patient's functional ability Established a written schedule for health screening [place] Completed and Reviewed  Exercise Activities and Dietary recommendations Goals    None      Immunization History  Administered Date(s) Administered  . Pneumococcal Conjugate-13 06/20/2014  . Pneumococcal Polysaccharide-23 11/18/2015  . Tdap 01/28/2012  . Zoster 01/28/2012    Health Maintenance  Topic Date Due  . Hepatitis C Screening  28-Aug-1950  . FOOT EXAM  11/28/1958  . DEXA SCAN  11/27/2013  . OPHTHALMOLOGY EXAM  05/04/2016  . MAMMOGRAM  05/19/2017  . HEMOGLOBIN A1C  08/13/2017  . INFLUENZA VACCINE  03/02/2018  . COLONOSCOPY  09/28/2020  . TETANUS/TDAP  01/27/2022  . PNA vac Low Risk Adult  Completed     Discussed health benefits of physical activity, and encouraged her to engage in regular exercise appropriate for her age and condition.    1. Annual physical exam Normal physical exam today. Will check labs as below and f/u pending lab results. If labs are stable and WNL she will not need to have these rechecked for one year at her next annual physical exam. She is to call the office in the meantime if she has any acute issue, questions or concerns. - CBC with Differential/Platelet - Comprehensive metabolic panel - Hemoglobin A1c - Lipid Panel With LDL/HDL Ratio - TSH  2. Breast cancer screening Breast exam today was normal. There is no  family history of breast  cancer. She does perform regular self breast exams. Mammogram was ordered as below. Information for [facility] clinic was given to patient so she may schedule her mammogram at her convenience. - MM Digital Screening; Future  3. Type 2 diabetes mellitus without complication, without long-term current use of insulin (HCC) Urine microalbumin increased to 50 but patient is already on quinapril. Will check labs as below and f/u pending results. Continue metformin 500 mg BID, Januvia 100 mg daily. - POCT UA - Microalbumin - CBC with Differential/Platelet - Comprehensive metabolic panel - Hemoglobin A1c - Lipid Panel With LDL/HDL Ratio  4. Essential (primary) hypertension Stable. Continue amlodipine 57m, quinapril-hctz 20-241m Will check labs as below and f/u pending results. - CBC with Differential/Platelet - Comprehensive metabolic panel - Hemoglobin A1c - Lipid Panel With LDL/HDL Ratio  5. Hypothyroidism, unspecified type Stable. Continue levothyroxine 25 mcg. Will check labs as below and f/u pending results.  - TSH  6. Pure hypercholesterolemia Stable. Continue simvastatin 10 mg. Will check labs as below and f/u pending results. - CBC with Differential/Platelet - Comprehensive metabolic panel - Hemoglobin A1c - Lipid Panel With LDL/HDL Ratio  7. Need for hepatitis C screening test - Hepatitis C antibody  8. BMI 40.0-44.9, adult [facility] on healthy lifestyle modifications including dieting and exercise.   9. Screening for measles - Measles/Mumps/Rubella Immunity  10. Osteoporosis screening No previous BMD document found. BMD ordered and advised to schedule when she calls for her mammogram.  - DG Bone Density; Future  ------------------------------------------------------------------------------------------------------------    JeMar DaringPA-C  [facility]

## 2018-03-08 NOTE — Patient Instructions (Signed)
Health Maintenance for Postmenopausal Women Menopause is a normal process in which your reproductive ability comes to an end. This process happens gradually over a span of months to years, usually between the ages of 22 and 9. Menopause is complete when you have missed 12 consecutive menstrual periods. It is important to talk with your health care provider about some of the most common conditions that affect postmenopausal women, such as heart disease, cancer, and bone loss (osteoporosis). Adopting a healthy lifestyle and getting preventive care can help to promote your health and wellness. Those actions can also lower your chances of developing some of these common conditions. What should I know about menopause? During menopause, you may experience a number of symptoms, such as:  Moderate-to-severe hot flashes.  Night sweats.  Decrease in sex drive.  Mood swings.  Headaches.  Tiredness.  Irritability.  Memory problems.  Insomnia.  Choosing to treat or not to treat menopausal changes is an individual decision that you make with your health care provider. What should I know about hormone replacement therapy and supplements? Hormone therapy products are effective for treating symptoms that are associated with menopause, such as hot flashes and night sweats. Hormone replacement carries certain risks, especially as you become older. If you are thinking about using estrogen or estrogen with progestin treatments, discuss the benefits and risks with your health care provider. What should I know about heart disease and stroke? Heart disease, heart attack, and stroke become more likely as you age. This may be due, in part, to the hormonal changes that your body experiences during menopause. These can affect how your body processes dietary fats, triglycerides, and cholesterol. Heart attack and stroke are both medical emergencies. There are many things that you can do to help prevent heart disease  and stroke:  Have your blood pressure checked at least every 1-2 years. High blood pressure causes heart disease and increases the risk of stroke.  If you are 53-22 years old, ask your health care provider if you should take aspirin to prevent a heart attack or a stroke.  Do not use any tobacco products, including cigarettes, chewing tobacco, or electronic cigarettes. If you need help quitting, ask your health care provider.  It is important to eat a healthy diet and maintain a healthy weight. ? Be sure to include plenty of vegetables, fruits, low-fat dairy products, and lean protein. ? Avoid eating foods that are high in solid fats, added sugars, or salt (sodium).  Get regular exercise. This is one of the most important things that you can do for your health. ? Try to exercise for at least 150 minutes each week. The type of exercise that you do should increase your heart rate and make you sweat. This is known as moderate-intensity exercise. ? Try to do strengthening exercises at least twice each week. Do these in addition to the moderate-intensity exercise.  Know your numbers.Ask your health care provider to check your cholesterol and your blood glucose. Continue to have your blood tested as directed by your health care provider.  What should I know about cancer screening? There are several types of cancer. Take the following steps to reduce your risk and to catch any cancer development as early as possible. Breast Cancer  Practice breast self-awareness. ? This means understanding how your breasts normally appear and feel. ? It also means doing regular breast self-exams. Let your health care provider know about any changes, no matter how small.  If you are 40  or older, have a clinician do a breast exam (clinical breast exam or CBE) every year. Depending on your age, family history, and medical history, it may be recommended that you also have a yearly breast X-ray (mammogram).  If you  have a family history of breast cancer, talk with your health care provider about genetic screening.  If you are at high risk for breast cancer, talk with your health care provider about having an MRI and a mammogram every year.  Breast cancer (BRCA) gene test is recommended for women who have family members with BRCA-related cancers. Results of the assessment will determine the need for genetic counseling and BRCA1 and for BRCA2 testing. BRCA-related cancers include these types: ? Breast. This occurs in males or females. ? Ovarian. ? Tubal. This may also be called fallopian tube cancer. ? Cancer of the abdominal or pelvic lining (peritoneal cancer). ? Prostate. ? Pancreatic.  Cervical, Uterine, and Ovarian Cancer Your health care provider may recommend that you be screened regularly for cancer of the pelvic organs. These include your ovaries, uterus, and vagina. This screening involves a pelvic exam, which includes checking for microscopic changes to the surface of your cervix (Pap test).  For women ages 21-65, health care providers may recommend a pelvic exam and a Pap test every three years. For women ages 79-65, they may recommend the Pap test and pelvic exam, combined with testing for human papilloma virus (HPV), every five years. Some types of HPV increase your risk of cervical cancer. Testing for HPV may also be done on women of any age who have unclear Pap test results.  Other health care providers may not recommend any screening for nonpregnant women who are considered low risk for pelvic cancer and have no symptoms. Ask your health care provider if a screening pelvic exam is right for you.  If you have had past treatment for cervical cancer or a condition that could lead to cancer, you need Pap tests and screening for cancer for at least 20 years after your treatment. If Pap tests have been discontinued for you, your risk factors (such as having a new sexual partner) need to be  reassessed to determine if you should start having screenings again. Some women have medical problems that increase the chance of getting cervical cancer. In these cases, your health care provider may recommend that you have screening and Pap tests more often.  If you have a family history of uterine cancer or ovarian cancer, talk with your health care provider about genetic screening.  If you have vaginal bleeding after reaching menopause, tell your health care provider.  There are currently no reliable tests available to screen for ovarian cancer.  Lung Cancer Lung cancer screening is recommended for adults 69-62 years old who are at high risk for lung cancer because of a history of smoking. A yearly low-dose CT scan of the lungs is recommended if you:  Currently smoke.  Have a history of at least 30 pack-years of smoking and you currently smoke or have quit within the past 15 years. A pack-year is smoking an average of one pack of cigarettes per day for one year.  Yearly screening should:  Continue until it has been 15 years since you quit.  Stop if you develop a health problem that would prevent you from having lung cancer treatment.  Colorectal Cancer  This type of cancer can be detected and can often be prevented.  Routine colorectal cancer screening usually begins at  age 42 and continues through age 45.  If you have risk factors for colon cancer, your health care provider may recommend that you be screened at an earlier age.  If you have a family history of colorectal cancer, talk with your health care provider about genetic screening.  Your health care provider may also recommend using home test kits to check for hidden blood in your stool.  A small camera at the end of a tube can be used to examine your colon directly (sigmoidoscopy or colonoscopy). This is done to check for the earliest forms of colorectal cancer.  Direct examination of the colon should be repeated every  5-10 years until age 71. However, if early forms of precancerous polyps or small growths are found or if you have a family history or genetic risk for colorectal cancer, you may need to be screened more often.  Skin Cancer  Check your skin from head to toe regularly.  Monitor any moles. Be sure to tell your health care provider: ? About any new moles or changes in moles, especially if there is a change in a mole's shape or color. ? If you have a mole that is larger than the size of a pencil eraser.  If any of your family members has a history of skin cancer, especially at a young age, talk with your health care provider about genetic screening.  Always use sunscreen. Apply sunscreen liberally and repeatedly throughout the day.  Whenever you are outside, protect yourself by wearing long sleeves, pants, a wide-brimmed hat, and sunglasses.  What should I know about osteoporosis? Osteoporosis is a condition in which bone destruction happens more quickly than new bone creation. After menopause, you may be at an increased risk for osteoporosis. To help prevent osteoporosis or the bone fractures that can happen because of osteoporosis, the following is recommended:  If you are 46-71 years old, get at least 1,000 mg of calcium and at least 600 mg of vitamin D per day.  If you are older than age 55 but younger than age 65, get at least 1,200 mg of calcium and at least 600 mg of vitamin D per day.  If you are older than age 54, get at least 1,200 mg of calcium and at least 800 mg of vitamin D per day.  Smoking and excessive alcohol intake increase the risk of osteoporosis. Eat foods that are rich in calcium and vitamin D, and do weight-bearing exercises several times each week as directed by your health care provider. What should I know about how menopause affects my mental health? Depression may occur at any age, but it is more common as you become older. Common symptoms of depression  include:  Low or sad mood.  Changes in sleep patterns.  Changes in appetite or eating patterns.  Feeling an overall lack of motivation or enjoyment of activities that you previously enjoyed.  Frequent crying spells.  Talk with your health care provider if you think that you are experiencing depression. What should I know about immunizations? It is important that you get and maintain your immunizations. These include:  Tetanus, diphtheria, and pertussis (Tdap) booster vaccine.  Influenza every year before the flu season begins.  Pneumonia vaccine.  Shingles vaccine.  Your health care provider may also recommend other immunizations. This information is not intended to replace advice given to you by your health care provider. Make sure you discuss any questions you have with your health care provider. Document Released: 09/10/2005  Document Revised: 02/06/2016 Document Reviewed: 04/22/2015 Elsevier Interactive Patient Education  2018 [employer]vier Inc[employer]  

## 2018-03-14 ENCOUNTER — Telehealth: Payer: Self-pay

## 2018-03-14 ENCOUNTER — Telehealth: Payer: Self-pay | Admitting: Physician Assistant

## 2018-03-14 LAB — LIPID PANEL WITH LDL/HDL RATIO
CHOLESTEROL TOTAL: 170 mg/dL (ref 100–199)
HDL: 46 mg/dL (ref >39)
LDL CALC: 86 mg/dL (ref 0–99)
LDl/HDL Ratio: 1.9 ratio (ref 0.0–3.2)
Triglycerides: 188 mg/dL — ABNORMAL HIGH (ref 0–149)
VLDL Cholesterol Cal: 38 mg/dL (ref 5–40)

## 2018-03-14 LAB — COMPREHENSIVE METABOLIC PANEL
A/G RATIO: 1.7 (ref 1.2–2.2)
ALT: 29 IU/L (ref 0–32)
AST: 20 IU/L (ref 0–40)
Albumin: 4.2 g/dL (ref 3.6–4.8)
Alkaline Phosphatase: 63 IU/L (ref 39–117)
BILIRUBIN TOTAL: 0.4 mg/dL (ref 0.0–1.2)
BUN/Creatinine Ratio: 18 (ref 12–28)
BUN: 17 mg/dL (ref 8–27)
CHLORIDE: 98 mmol/L (ref 96–106)
CO2: 22 mmol/L (ref 20–29)
Calcium: 9.7 mg/dL (ref 8.7–10.3)
Creatinine, Ser: 0.95 mg/dL (ref 0.57–1.00)
GFR calc non Af Amer: 61 mL/min/{1.73_m2} (ref >59)
GFR, EST AFRICAN AMERICAN: 71 mL/min/{1.73_m2} (ref >59)
Globulin, Total: 2.5 g/dL (ref 1.5–4.5)
Glucose: 207 mg/dL — ABNORMAL HIGH (ref 65–99)
POTASSIUM: 4.1 mmol/L (ref 3.5–5.2)
Sodium: 138 mmol/L (ref 134–144)
TOTAL PROTEIN: 6.7 g/dL (ref 6.0–8.5)

## 2018-03-14 LAB — CBC WITH DIFFERENTIAL/PLATELET
BASOS: 0 %
Basophils Absolute: 0 10*3/uL (ref 0.0–0.2)
EOS (ABSOLUTE): 0.2 10*3/uL (ref 0.0–0.4)
Eos: 3 %
Hematocrit: 44.7 % (ref 34.0–46.6)
Hemoglobin: 14.6 g/dL (ref 11.1–15.9)
Immature Grans (Abs): 0 10*3/uL (ref 0.0–0.1)
Immature Granulocytes: 0 %
LYMPHS ABS: 2.6 10*3/uL (ref 0.7–3.1)
Lymphs: 36 %
MCH: 30.3 pg (ref 26.6–33.0)
MCHC: 32.7 g/dL (ref 31.5–35.7)
MCV: 93 fL (ref 79–97)
MONOS ABS: 0.6 10*3/uL (ref 0.1–0.9)
Monocytes: 8 %
NEUTROS ABS: 3.8 10*3/uL (ref 1.4–7.0)
Neutrophils: 53 %
PLATELETS: 196 10*3/uL (ref 150–450)
RBC: 4.82 x10E6/uL (ref 3.77–5.28)
RDW: 13.5 % (ref 12.3–15.4)
WBC: 7.3 10*3/uL (ref 3.4–10.8)

## 2018-03-14 LAB — HEMOGLOBIN A1C
Est. average glucose Bld gHb Est-mCnc: 200 mg/dL
Hgb A1c MFr Bld: 8.6 % — ABNORMAL HIGH (ref 4.8–5.6)

## 2018-03-14 LAB — HEPATITIS C ANTIBODY: HEP C VIRUS AB: 0.3 {s_co_ratio} (ref 0.0–0.9)

## 2018-03-14 LAB — MEASLES/MUMPS/RUBELLA IMMUNITY
MUMPS ABS, IGG: 216 AU/mL (ref >10.9)
RUBELLA: 20.3 {index} (ref >0.99)
RUBEOLA AB, IGG: >300 AU/mL (ref >29.9)

## 2018-03-14 LAB — TSH: TSH: 6.85 u[IU]/mL — ABNORMAL HIGH (ref 0.450–4.500)

## 2018-03-14 NOTE — Telephone Encounter (Signed)
Pt returned call ° °Thanks  teri °

## 2018-03-14 NOTE — Telephone Encounter (Signed)
I called pt to get bone density set up.She states she would prefer to set this up herself.Phone # provided

## 2018-03-14 NOTE — Telephone Encounter (Signed)
-----   Message from Margaretann LovelessJennifer M Burnette, PA-C sent at 03/14/2018  4:08 PM EDT ----- A1c is up to 8.6 from 7.2. Would recommend to increase Metformin if tolerated to 1000mg  BID, currently she is taking 500mg . Continue Januvia 100mg  as well. Also work on lifestyle modifications including dieting and exercise. Blood count is normal. Kidney and liver function normal. Cholesterol actually improved from last year. Thyroid still shows hypothyroidism. Would recommend to increase levothyroxine for better control. Hep C is negative. You are immune to measles, mumps, rubella. If agreeable to med increases I will send in for her.

## 2018-03-14 NOTE — Telephone Encounter (Signed)
LMTCB  Thanks,  -Erianna Jolly 

## 2018-03-15 NOTE — Telephone Encounter (Signed)
[facility] screen completed and faxed to [phone]

## 2018-03-17 NOTE — Telephone Encounter (Signed)
Patient advised as below.  

## 2018-03-29 ENCOUNTER — Encounter: Payer: Self-pay | Admitting: Physician Assistant

## 2018-04-10 ENCOUNTER — Telehealth: Payer: Self-pay | Admitting: Physician Assistant

## 2018-04-10 DIAGNOSIS — J0140 Acute pansinusitis, unspecified: Secondary | ICD-10-CM

## 2018-04-10 MED ORDER — AMOXICILLIN-POT CLAVULANATE 875-125 MG PO TABS: 1.0000 | ORAL_TABLET | Freq: Two times a day (BID) | ORAL | 0 refills | Status: DC

## 2018-04-10 NOTE — Telephone Encounter (Signed)
Patient states she gets sinus infections frequently and she said she has had one a week and has tried OTC meds that arent working.  I explained to her that we do not call in antibiotics normally without seeing a patient.  She said that you have in the past.    [facility] in [place]

## 2018-04-10 NOTE — Telephone Encounter (Signed)
Sent in Augmentin. Needs to be seen if no improvements.

## 2018-04-12 ENCOUNTER — Ambulatory Visit
Admission: RE | Admit: 2018-04-12 | Discharge: 2018-04-12 | Disposition: A | Discharge status: Home / Self Care | Payer: 59 | Source: Ambulatory Visit | Attending: Physician Assistant | Admitting: Physician Assistant

## 2018-04-12 DIAGNOSIS — Z13820 Encounter for screening for osteoporosis: Secondary | ICD-10-CM

## 2018-04-12 DIAGNOSIS — Z1231 Encounter for screening mammogram for malignant neoplasm of breast: Secondary | ICD-10-CM | POA: Insufficient documentation

## 2018-04-12 DIAGNOSIS — Z1239 Encounter for other screening for malignant neoplasm of breast: Secondary | ICD-10-CM

## 2018-04-13 ENCOUNTER — Telehealth: Payer: Self-pay

## 2018-04-13 NOTE — Telephone Encounter (Signed)
Patient advised as directed below. 

## 2018-04-13 NOTE — Telephone Encounter (Signed)
-----   Message from Margaretann LovelessJennifer M Burnette, PA-C sent at 04/12/2018  4:29 PM EDT ----- Bone density normal.

## 2018-04-17 ENCOUNTER — Telehealth: Payer: Self-pay

## 2018-04-17 ENCOUNTER — Other Ambulatory Visit: Payer: Self-pay | Admitting: Physician Assistant

## 2018-04-17 DIAGNOSIS — N6320 Unspecified lump in the left breast, unspecified quadrant: Secondary | ICD-10-CM

## 2018-04-17 DIAGNOSIS — R928 Other abnormal and inconclusive findings on diagnostic imaging of breast: Secondary | ICD-10-CM

## 2018-04-17 NOTE — Telephone Encounter (Signed)
Patient advised as directed below.  Thanks,  -Corra Kaine 

## 2018-04-17 NOTE — Telephone Encounter (Signed)
-----   Message from Margaretann LovelessJennifer M Burnette, [place] JerseyPA-C sent at 04/17/2018  8:58 AM EDT ----- Abnormal mammogram noted in the left breast. [facility] will be contacting you to schedule a f/u for diagnostic mammogram and US of the left breast. At this time we do not know anything more which is why we need more imaging to know what to do next.

## 2018-04-26 ENCOUNTER — Ambulatory Visit
Admission: RE | Admit: 2018-04-26 | Discharge: 2018-04-26 | Disposition: A | Discharge status: Home / Self Care | Payer: 59 | Source: Ambulatory Visit | Attending: Physician Assistant | Admitting: Physician Assistant

## 2018-04-26 DIAGNOSIS — N6320 Unspecified lump in the left breast, unspecified quadrant: Secondary | ICD-10-CM

## 2018-04-26 DIAGNOSIS — R928 Other abnormal and inconclusive findings on diagnostic imaging of breast: Secondary | ICD-10-CM

## 2018-05-11 ENCOUNTER — Other Ambulatory Visit: Payer: Self-pay | Admitting: Family Medicine

## 2018-05-11 DIAGNOSIS — J302 Other seasonal allergic rhinitis: Secondary | ICD-10-CM

## 2018-05-11 DIAGNOSIS — E785 Hyperlipidemia, unspecified: Secondary | ICD-10-CM

## 2018-05-12 ENCOUNTER — Encounter: Payer: Self-pay | Admitting: Physician Assistant

## 2018-07-04 ENCOUNTER — Encounter: Payer: Self-pay | Admitting: Physician Assistant

## 2018-07-04 ENCOUNTER — Ambulatory Visit: Payer: 59 | Admitting: Physician Assistant

## 2018-07-04 VITALS — BP 140/88 | HR 96 | Temp 97.6°F | Resp 16 | Wt 197.0 lb

## 2018-07-04 DIAGNOSIS — M542 Cervicalgia: Secondary | ICD-10-CM | POA: Diagnosis not present

## 2018-07-04 MED ORDER — METHOCARBAMOL 500 MG PO TABS: 500.0000 mg | ORAL_TABLET | Freq: Three times a day (TID) | ORAL | 0 refills | Status: AC | PRN

## 2018-07-04 MED ORDER — ACETAMINOPHEN ER 650 MG PO TBCR: 650.0000 mg | EXTENDED_RELEASE_TABLET | Freq: Three times a day (TID) | ORAL | 0 refills | Status: AC | PRN

## 2018-07-04 NOTE — Patient Instructions (Signed)

## 2018-07-04 NOTE — Progress Notes (Signed)
Patient: Christine Clarke Female    DOB: 1950-08-31   67 y.o.   MRN: [id number] Visit Date: 07/04/2018  Today's Provider: Trinna Post, PA-C   Chief Complaint  Patient presents with  . Neck Pain  . Shoulder Pain   Subjective:    HPI Patient here today with c/o neck pain and shoulder pain for a week. Patient reports no injury. She has pain in her left neck that sometimes radiates to her shoulder. She denies trouble putting on jacket or holding things. Denies loss of grip strength. No history of osteoporosis or scoliosis.Patient has tried heat and flexeril with some relief.   Knee: She also reports that her knee gets stiffness sometimes.    Allergies  Allergen Reactions  . Sulfa Antibiotics Rash    Erythema multiforme     Current Outpatient Medications:  .  amLODipine (NORVASC) 5 MG tablet, TAKE 1 TABLET BY MOUTH  EVERY DAY, Disp: 90 tablet, Rfl: 4 .  Blood Glucose Monitoring Suppl (ONETOUCH VERIO FLEX SYSTEM) w/Device KIT, To check BG once daily, Disp: 1 kit, Rfl: 0 .  fluticasone (FLONASE) 50 MCG/ACT nasal spray, Place 1 spray into both nostrils daily., Disp: 48 g, Rfl: 1 .  glucose blood (ONETOUCH VERIO) test strip, To check blood glucose daily, Disp: 100 each, Rfl: 12 .  JANUVIA 100 MG tablet, TAKE 1 TABLET BY MOUTH  DAILY, Disp: 90 tablet, Rfl: 4 .  levothyroxine (SYNTHROID, LEVOTHROID) 25 MCG tablet, TAKE 1 TABLET BY MOUTH  DAILY BEFORE BREAKFAST.  TAKE 30 MINUTES BEFORE ANY  FOOD OR OTHER MEDICATIONS., Disp: 90 tablet, Rfl: 1 .  meloxicam (MOBIC) 15 MG tablet, TAKE 1 TABLET BY MOUTH  EVERY DAY, Disp: 90 tablet, Rfl: 1 .  metFORMIN (GLUCOPHAGE) 500 MG tablet, TAKE 2 TABLETS BY MOUTH  DAILY WITH BREAKFAST, Disp: 180 tablet, Rfl: 3 .  montelukast (SINGULAIR) 10 MG tablet, TAKE 1 TABLET BY MOUTH  DAILY, Disp: 90 tablet, Rfl: 4 .  ONETOUCH DELICA LANCETS 56E MISC, Check blood sugar before  meals and at bedtime, Disp: 400 each, Rfl: 3 .  quinapril-hydrochlorothiazide  (ACCURETIC) 20-25 MG tablet, TAKE 1 TABLET BY MOUTH  DAILY, Disp: 90 tablet, Rfl: 3 .  simvastatin (ZOCOR) 10 MG tablet, TAKE 1 TABLET BY MOUTH  DAILY, Disp: 90 tablet, Rfl: 4 .  amoxicillin-clavulanate (AUGMENTIN) 875-125 MG tablet, Take 1 tablet by mouth 2 (two) times daily. (Patient not taking: Reported on 07/04/2018), Disp: 20 tablet, Rfl: 0  Review of Systems  Social History   Tobacco Use  . Smoking status: Never Smoker  . Smokeless tobacco: Never Used  Substance Use Topics  . Alcohol use: Yes    Comment: occasional   Objective:   BP 140/88 (BP Location: Left Arm, Patient Position: Sitting, Cuff Size: Large)   Pulse 96   Temp 97.6 F (36.4 C) (Oral)   Resp 16   Wt 197 lb (89.4 kg)   LMP  (LMP Unknown)   SpO2 97%   BMI 39.79 kg/m  Vitals:   07/04/18 1042  BP: 140/88  Pulse: 96  Resp: 16  Temp: 97.6 F (36.4 C)  TempSrc: Oral  SpO2: 97%  Weight: 197 lb (89.4 kg)     Physical Exam  Constitutional: She is oriented to person, place, and time. She appears well-developed and well-nourished.  Musculoskeletal: Normal range of motion. She exhibits tenderness. She exhibits no edema or deformity.  No impingement signs bilaterally. Normal strength in upper extremities.  Shoulders are uneven with left being higher than right.  Neurological: She is alert and oriented to person, place, and time.  Skin: Skin is warm and dry.  Psychiatric: She has a normal mood and affect. Her behavior is normal.        Assessment & Plan:     1. Neck pain on left side  Do think this is muscular. She is currently taking mobic 15 mg daily for OA. Can supplement with tylenol and will give less sedating muscle relaxer. Encouraged massage, warm heat and ROM.   - acetaminophen (TYLENOL 8 HOUR) 650 MG CR tablet; Take 1 tablet (650 mg total) by mouth every 8 (eight) hours as needed for pain.  Dispense: 90 tablet; Refill: 0 - methocarbamol (ROBAXIN) 500 MG tablet; Take 1 tablet (500 mg total) by mouth  every 8 (eight) hours as needed for up to 14 days for muscle spasms.  Dispense: 42 tablet; Refill: 0  Return if symptoms worsen or fail to improve.  The entirety of the information documented in the History of Present Illness, Review of Systems and Physical Exam were personally obtained by me. Portions of this information were initially documented by Lyndel Pleasure, CMA and reviewed by me for thoroughness and accuracy.           Trinna Post, PA-C  [facility]

## 2018-11-06 ENCOUNTER — Other Ambulatory Visit: Payer: Self-pay | Admitting: Physician Assistant

## 2018-11-06 DIAGNOSIS — M1990 Unspecified osteoarthritis, unspecified site: Secondary | ICD-10-CM

## 2018-11-06 DIAGNOSIS — E039 Hypothyroidism, unspecified: Secondary | ICD-10-CM

## 2018-11-13 IMAGING — MG MM DIGITAL SCREENING BILAT W/ TOMO W/ CAD
8 of 12 series · 8 of 28 positions shown · non-contrast
Comparison: Previous exam(s).

CLINICAL DATA: Screening.

EXAM:
DIGITAL SCREENING BILATERAL MAMMOGRAM WITH TOMO AND CAD

[L MLO synth-2D]
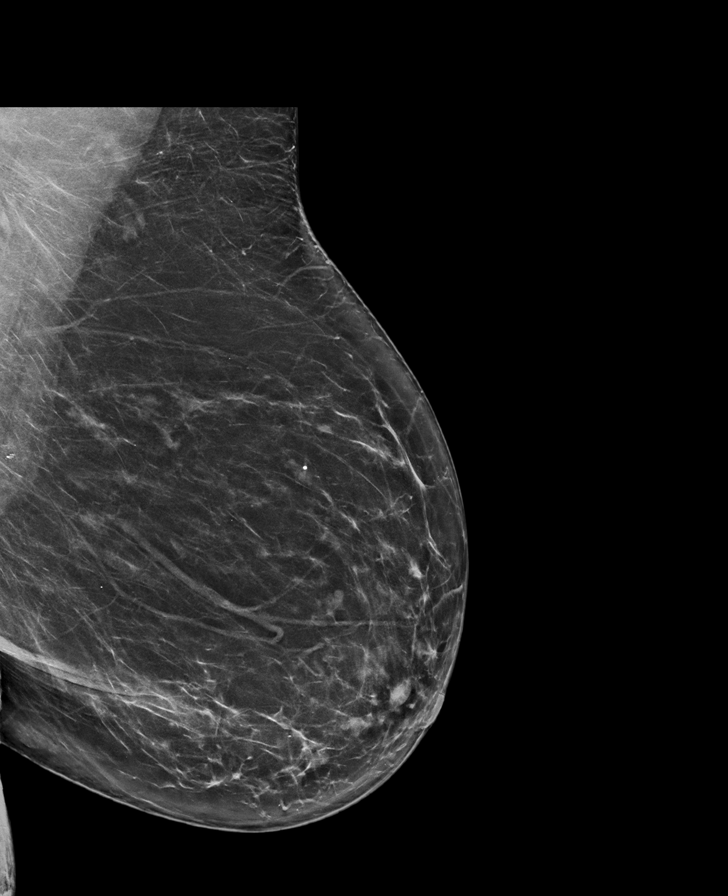

[R MLO synth-2D]
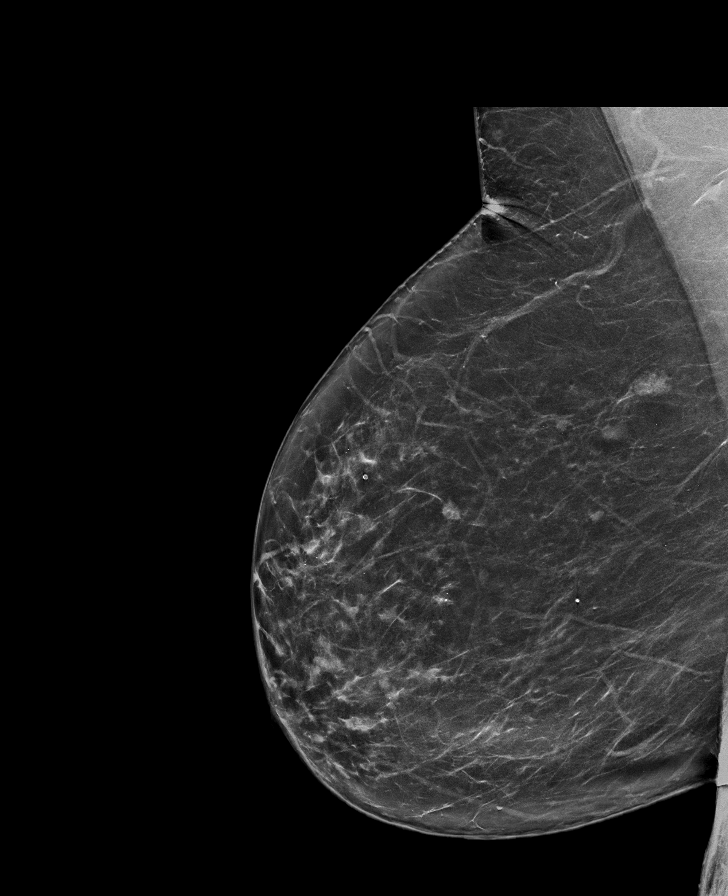

[L CC synth-2D]
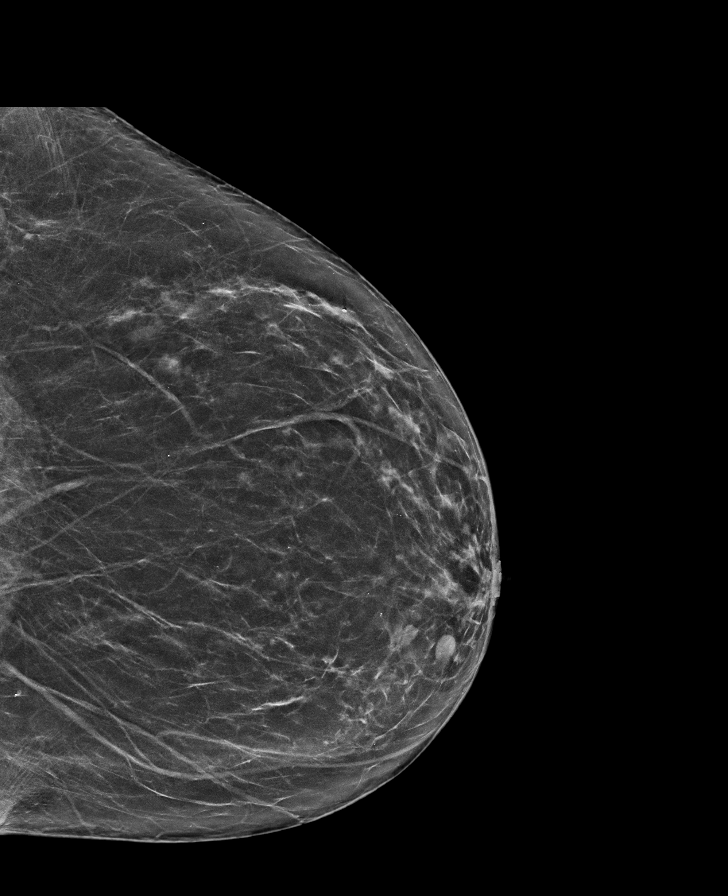

[R CC synth-2D]
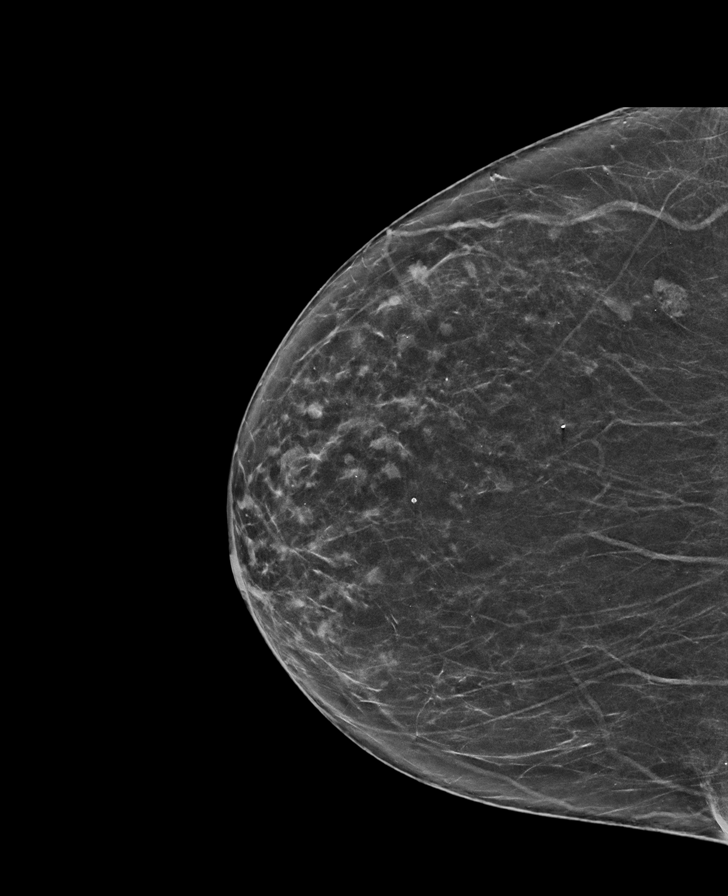

[L CC (1 of 2)]
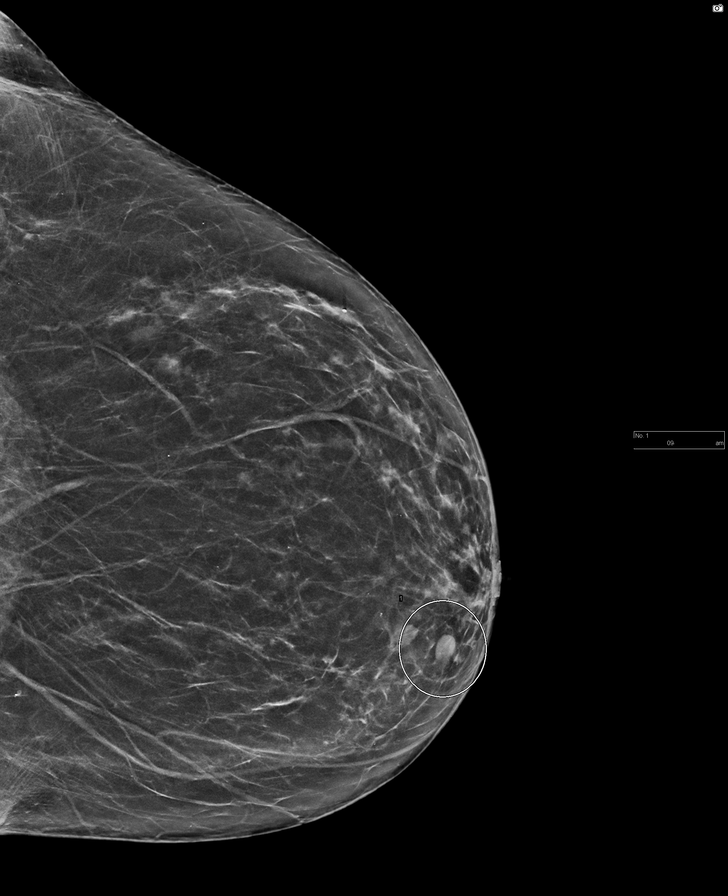

[L MLO (1 of 2)]
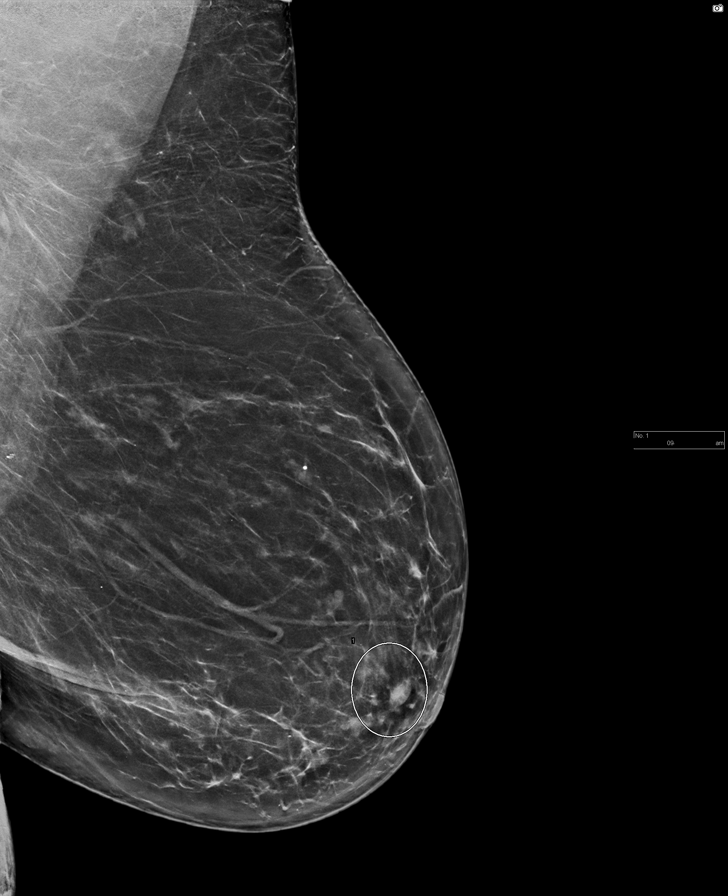

[L CC (2 of 2)]
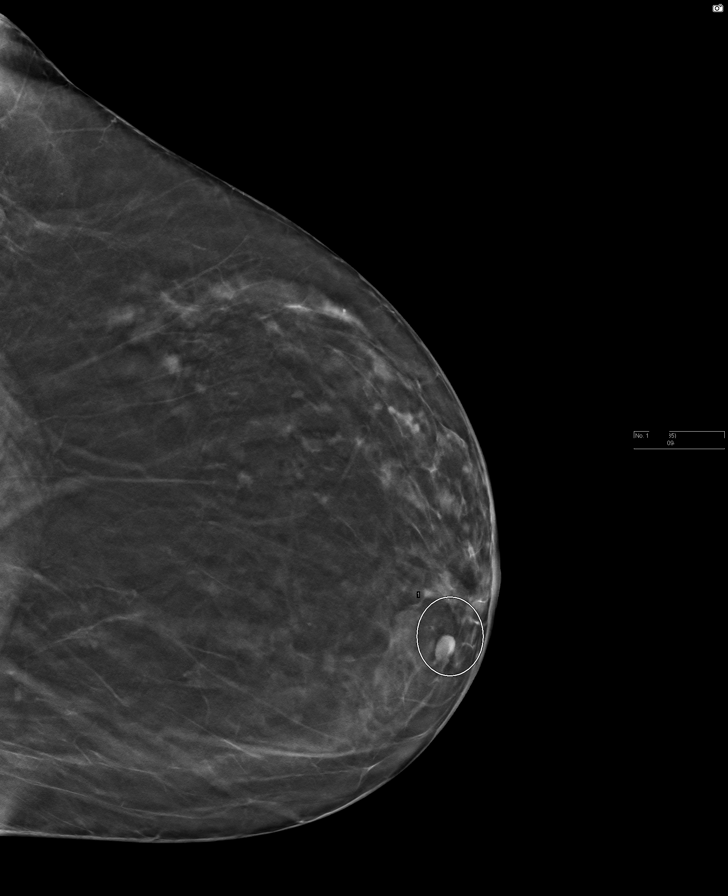

[L MLO (2 of 2)]
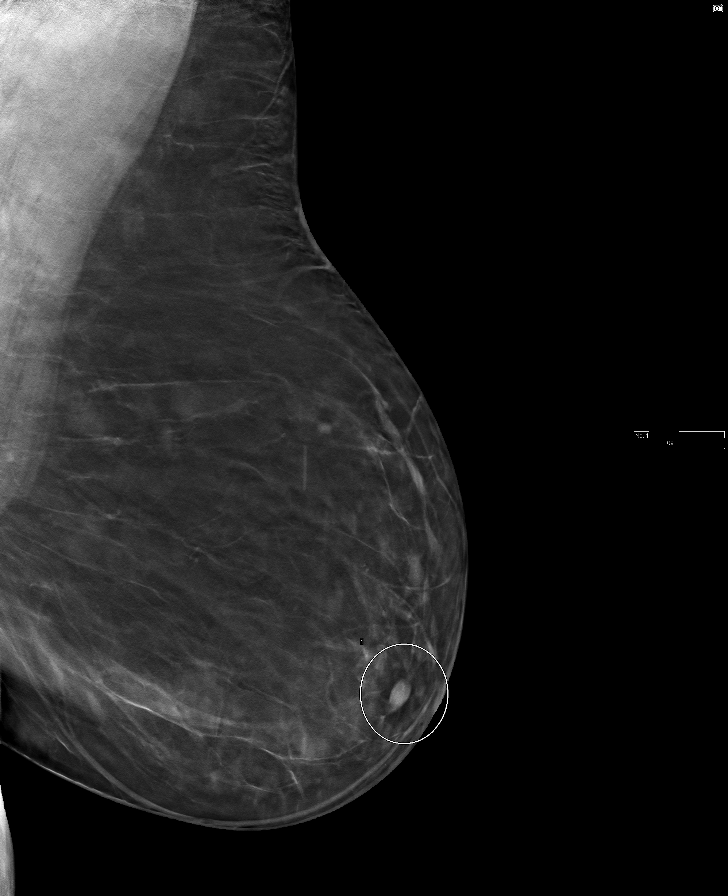

[8 of 28 positions shown; findings below may reference images not displayed]

ACR Breast Density Category b: There are scattered areas of
fibroglandular density.
FINDINGS: In the left breast, a possible mass warrants further evaluation. In
the right breast, no findings suspicious for malignancy.

Images were processed with CAD.
IMPRESSION: Further evaluation is suggested for possible mass in the left
breast.

RECOMMENDATION:
Diagnostic mammogram and possibly ultrasound of the left breast.
(Code:JC-2-SSL)

The patient will be contacted regarding the findings, and additional
imaging will be scheduled.

BI-RADS CATEGORY  0: Incomplete. Need additional imaging evaluation
and/or prior mammograms for comparison.

## 2018-12-21 ENCOUNTER — Other Ambulatory Visit: Payer: Self-pay | Admitting: Physician Assistant

## 2018-12-21 DIAGNOSIS — J301 Allergic rhinitis due to pollen: Secondary | ICD-10-CM

## 2019-04-18 ENCOUNTER — Ambulatory Visit: Payer: Self-pay | Admitting: Physician Assistant

## 2019-04-23 NOTE — Progress Notes (Signed)
Patient: HULDAH Clarke Female    DOB: 1951-05-14   68 y.o.   MRN: [id number] Visit Date: 04/24/2019  Today's Provider: Mar Daring, PA-C   No chief complaint on file.  Subjective:     HPI   Patient is here to discuss medication metformin. Reports she has diarrhea and occasional abdominal cramping. She recently ran out of medication and would like to possibly change to something else.   She also recently lost her SO. He passed on 04/10/82 from covid complications. He had two sons and one of the sons had covid and spit in his dad's face to give it to him. The other son is struggling with the loss of his dad and she is trying to help him. She is the one that found him in the floor because she had called him and he did not answer so she went to check on him. She has been having crying spells since where she will cry for no reason. She would like to start a medication for her "nerves".   She also reports allergy symptoms. She needs refills on her flonase and her singulair.    Allergies  Allergen Reactions  . Sulfa Antibiotics Rash    Erythema multiforme  . Metformin And Related Diarrhea     Current Outpatient Medications:  .  amLODipine (NORVASC) 5 MG tablet, TAKE 1 TABLET BY MOUTH  EVERY DAY, Disp: 90 tablet, Rfl: 4 .  Blood Glucose Monitoring Suppl (ONETOUCH VERIO FLEX SYSTEM) w/Device KIT, To check BG once daily, Disp: 1 kit, Rfl: 0 .  fluticasone (FLONASE) 50 MCG/ACT nasal spray, USE 1 SPRAY INTO BOTH  NOSTRILS DAILY, Disp: 48 g, Rfl: 2 .  glucose blood (ONETOUCH VERIO) test strip, To check blood glucose daily, Disp: 100 each, Rfl: 12 .  JANUVIA 100 MG tablet, TAKE 1 TABLET BY MOUTH  DAILY, Disp: 90 tablet, Rfl: 4 .  levothyroxine (SYNTHROID) 25 MCG tablet, TAKE 1 TABLET BY MOUTH  DAILY BEFORE BREAKFAST.  TAKE 30 MINUTES BEFORE ANY  FOOD OR OTHER MEDICATIONS., Disp: 90 tablet, Rfl: 1 .  meloxicam (MOBIC) 15 MG tablet, TAKE 1 TABLET BY MOUTH  EVERY DAY, Disp: 90  tablet, Rfl: 1 .  montelukast (SINGULAIR) 10 MG tablet, Take 1 tablet (10 mg total) by mouth daily., Disp: 90 tablet, Rfl: 4 .  ONETOUCH DELICA LANCETS 38S MISC, Check blood sugar before  meals and at bedtime, Disp: 400 each, Rfl: 3 .  quinapril-hydrochlorothiazide (ACCURETIC) 20-25 MG tablet, TAKE 1 TABLET BY MOUTH  DAILY, Disp: 90 tablet, Rfl: 3 .  simvastatin (ZOCOR) 10 MG tablet, TAKE 1 TABLET BY MOUTH  DAILY, Disp: 90 tablet, Rfl: 4 .  dapagliflozin propanediol (FARXIGA) 10 MG TABS tablet, Take 10 mg by mouth daily before breakfast., Disp: 90 tablet, Rfl: 3 .  escitalopram (LEXAPRO) 10 MG tablet, Take 1 tablet (10 mg total) by mouth at bedtime., Disp: 90 tablet, Rfl: 1 .  triamcinolone cream (KENALOG) 0.1 %, Apply 1 application topically 2 (two) times daily., Disp: 30 g, Rfl: 0  Review of Systems  Constitutional: Negative for appetite change, chills, fatigue and fever.  Eyes: Negative for visual disturbance.  Respiratory: Negative for chest tightness and shortness of breath.   Cardiovascular: Negative for chest pain and palpitations.  Gastrointestinal: Negative for abdominal pain, nausea and vomiting.  Neurological: Negative for dizziness, weakness and headaches.  Psychiatric/Behavioral: Positive for agitation, decreased concentration and dysphoric mood. Negative for sleep disturbance. The patient  is not nervous/anxious.     Social History   Tobacco Use  . Smoking status: Never Smoker  . Smokeless tobacco: Never Used  Substance Use Topics  . Alcohol use: Yes    Comment: occasional      Objective:   BP (!) 144/84 (BP Location: Right Arm, Cuff Size: Large)   Pulse (!) 118   Temp (!) 96.9 F (36.1 C) (Other (Comment))   Resp 16   Ht _0  (1.499 m)   Wt 187 lb (84.8 kg)   LMP  (LMP Unknown)   SpO2 94%   BMI 37.77 kg/m  Vitals:   04/24/19 1412 04/24/19 1525  BP: (!) 173/131 (!) 144/84  Pulse: (!) 118   Resp: 16   Temp: (!) 96.9 F (36.1 C)   TempSrc: Other (Comment)    SpO2: 94%   Weight: 187 lb (84.8 kg)   Height: _1  (1.499 m)   Body mass index is 37.77 kg/m.   Physical Exam Vitals signs reviewed.  Constitutional:      General: She is not in acute distress.    Appearance: Normal appearance. She is well-developed. She is obese. She is not ill-appearing or diaphoretic.  Neck:     Musculoskeletal: Normal range of motion and neck supple.     Thyroid: No thyromegaly.     Vascular: No JVD.     Trachea: No tracheal deviation.  Cardiovascular:     Rate and Rhythm: Normal rate and regular rhythm.     Heart sounds: Normal heart sounds. No murmur. No friction rub. No gallop.   Pulmonary:     Effort: Pulmonary effort is normal. No respiratory distress.     Breath sounds: Normal breath sounds. No wheezing or rales.  Lymphadenopathy:     Cervical: No cervical adenopathy.  Neurological:     Mental Status: She is alert.  Psychiatric:        Attention and Perception: Attention and perception normal.        Mood and Affect: Mood is depressed. Affect is tearful.        Speech: Speech normal.        Behavior: Behavior normal. Behavior is cooperative.        Thought Content: Thought content normal.        Cognition and Memory: Cognition and memory normal.        Judgment: Judgment normal.      No results found for any visits on 04/24/19.     Assessment & Plan    1. Grief reaction Will start Lexapro as below for grief. Discussed counseling but patient declines at this time. I will see her back in 2 months to see how she is doing.  - escitalopram (LEXAPRO) 10 MG tablet; Take 1 tablet (10 mg total) by mouth at bedtime.  Dispense: 90 tablet; Refill: 1  2. Type 2 diabetes mellitus without complication, without long-term current use of insulin ([place]) Ran out of metformin but was having GI side effects. Will change to [place] as below. Continue Januvia 187m. Will check labs as below and f/u pending results. - CBC w/Diff/Platelet - Comprehensive  Metabolic Panel (CMET) - Lipid Profile - HgB A1c - dapagliflozin propanediol (FARXIGA) 10 MG TABS tablet; Take 10 mg by mouth daily before breakfast.  Dispense: 90 tablet; Refill: 3  3. Seasonal allergic rhinitis due to pollen Stable. Diagnosis pulled for medication refill. Continue current medical treatment plan. - fluticasone (FLONASE) 50 MCG/ACT nasal spray; USE 1 SPRAY INTO  BOTH  NOSTRILS DAILY  Dispense: 48 g; Refill: 2 - montelukast (SINGULAIR) 10 MG tablet; Take 1 tablet (10 mg total) by mouth daily.  Dispense: 90 tablet; Refill: 4  4. Eczema, unspecified type Stable. Diagnosis pulled for medication refill. Continue current medical treatment plan. - triamcinolone cream (KENALOG) 0.1 %; Apply 1 application topically 2 (two) times daily.  Dispense: 30 g; Refill: 0  5. Hypothyroidism, unspecified type Stable. Diagnosis pulled for medication refill. Continue current medical treatment plan. Will check labs as below and f/u pending results. - TSH - levothyroxine (SYNTHROID) 25 MCG tablet; TAKE 1 TABLET BY MOUTH  DAILY BEFORE BREAKFAST.  TAKE 30 MINUTES BEFORE ANY  FOOD OR OTHER MEDICATIONS.  Dispense: 90 tablet; Refill: 1  6. Essential hypertension Elevated today but patient was crying during the initial BP reading and then was still a little emotional when we rechecked. Continue Amlodipine 68m, quinapril-HCTZ 20-246m Will check labs as below and f/u pending results. Will recheck BP when she returns in 2 months.  - CBC w/Diff/Platelet - Comprehensive Metabolic Panel (CMET) - Lipid Profile  7. Class 2 severe obesity due to excess calories with serious comorbidity and body mass index (BMI) of 37.0 to 37.9 in adult ([facility] healthy lifestyle modifications including dieting and exercise.   8. Need for influenza vaccination Flu vaccine given today without complication. Patient sat upright for 15 minutes to check for adverse reaction before being released. - Flu Vaccine  QUAD High Dose(Fluad)  9. Need for shingles vaccine Shingrix Vaccine #1 given to patient without complications. Patient sat for 15 minutes after administration and was tolerated well without adverse effects. Return in 2 months for Shingrix #2.  - Varicella-zoster vaccine IM (Shingrix)     JeMar DaringPA-C  [facility]

## 2019-04-24 ENCOUNTER — Encounter: Payer: Self-pay | Admitting: Physician Assistant

## 2019-04-24 ENCOUNTER — Ambulatory Visit: Payer: 59 | Admitting: Physician Assistant

## 2019-04-24 ENCOUNTER — Other Ambulatory Visit: Payer: Self-pay

## 2019-04-24 VITALS — BP 144/84 | HR 118 | Temp 96.9°F | Resp 16 | Ht 59.0 in | Wt 187.0 lb

## 2019-04-24 DIAGNOSIS — Z23 Encounter for immunization: Secondary | ICD-10-CM | POA: Diagnosis not present

## 2019-04-24 DIAGNOSIS — E039 Hypothyroidism, unspecified: Secondary | ICD-10-CM

## 2019-04-24 DIAGNOSIS — E119 Type 2 diabetes mellitus without complications: Secondary | ICD-10-CM | POA: Diagnosis not present

## 2019-04-24 DIAGNOSIS — L309 Dermatitis, unspecified: Secondary | ICD-10-CM | POA: Diagnosis not present

## 2019-04-24 DIAGNOSIS — F4321 Adjustment disorder with depressed mood: Secondary | ICD-10-CM | POA: Diagnosis not present

## 2019-04-24 DIAGNOSIS — J301 Allergic rhinitis due to pollen: Secondary | ICD-10-CM | POA: Diagnosis not present

## 2019-04-24 DIAGNOSIS — Z6837 Body mass index (BMI) 37.0-37.9, adult: Secondary | ICD-10-CM

## 2019-04-24 DIAGNOSIS — I10 Essential (primary) hypertension: Secondary | ICD-10-CM

## 2019-04-24 MED ORDER — TRIAMCINOLONE ACETONIDE 0.1 % EX CREA: 1.0000 "application " | TOPICAL_CREAM | Freq: Two times a day (BID) | CUTANEOUS | 0 refills | Status: DC

## 2019-04-24 MED ORDER — MONTELUKAST SODIUM 10 MG PO TABS: 10.0000 mg | ORAL_TABLET | Freq: Every day | ORAL | 4 refills | Status: DC

## 2019-04-24 MED ORDER — FLUTICASONE PROPIONATE 50 MCG/ACT NA SUSP: NASAL | 2 refills | Status: DC

## 2019-04-24 MED ORDER — LEVOTHYROXINE SODIUM 25 MCG PO TABS: ORAL_TABLET | ORAL | 1 refills | Status: DC

## 2019-04-24 MED ORDER — ESCITALOPRAM OXALATE 10 MG PO TABS: 10.0000 mg | ORAL_TABLET | Freq: Every day | ORAL | 1 refills | Status: DC

## 2019-04-24 MED ORDER — DAPAGLIFLOZIN PROPANEDIOL 10 MG PO TABS: 10.0000 mg | ORAL_TABLET | Freq: Every day | ORAL | 3 refills | Status: DC

## 2019-04-24 NOTE — Patient Instructions (Signed)
Managing Loss, Adult People experience loss in many different ways throughout their lives. Events such as moving, changing jobs, and losing friends can create a sense of loss. The loss may be as serious as a major health change, divorce, death of a pet, or death of a loved one. All of these types of loss are likely to create a physical and emotional reaction known as grief. Grief is the result of a major change or an absence of something or someone that you count on. Grief is a normal reaction to loss. A variety of factors can affect your grieving experience, including:  The nature of your loss.  Your relationship to what or whom you lost.  Your understanding of grief and how to manage it.  Your support system. How to manage lifestyle changes Keep to your normal routine as much as possible.  If you have trouble focusing or doing normal activities, it is acceptable to take some time away from your normal routine.  Spend time with friends and loved ones.  Eat a healthy diet, get plenty of sleep, and rest when you feel tired. How to recognize changes  The way that you deal with your grief will affect your ability to function as you normally do. When grieving, you may experience these changes:  Numbness, shock, sadness, anxiety, anger, denial, and guilt.  Thoughts about death.  Unexpected crying.  A physical sensation of emptiness in your stomach.  Problems sleeping and eating.  Tiredness (fatigue).  Loss of interest in normal activities.  Dreaming about or imagining seeing the person who died.  A need to remember what or whom you lost.  Difficulty thinking about anything other than your loss for a period of time.  Relief. If you have been expecting the loss for a while, you may feel a sense of relief when it happens. Follow these instructions at home:  Activity Express your feelings in healthy ways, such as:  Talking with others about your loss. It may be helpful to find  others who have had a similar loss, such as a support group.  Writing down your feelings in a journal.  Doing physical activities to release stress and emotional energy.  Doing creative activities like painting, sculpting, or playing or listening to music.  Practicing resilience. This is the ability to recover and adjust after facing challenges. Reading some resources that encourage resilience may help you to learn ways to practice those behaviors. General instructions  Be patient with yourself and others. Allow the grieving process to happen, and remember that grieving takes time. ? It is likely that you may never feel completely done with some grief. You may find a way to move on while still cherishing memories and feelings about your loss. ? Accepting your loss is a process. It can take months or longer to adjust.  Keep all follow-up visits as told by your health care provider. This is important. Where to find support To get support for managing loss:  Ask your health care provider for help and recommendations, such as grief counseling or therapy.  Think about joining a support group for people who are managing a loss. Where to find more information You can find more information about managing loss from:  American Society of Clinical Oncology: www.cancer.net  American Psychological Association: www.apa.org Contact a health care provider if:  Your grief is extreme and keeps getting worse.  You have ongoing grief that does not improve.  Your body shows symptoms of grief, such   as illness.  You feel depressed, anxious, or lonely. Get help right away if:  You have thoughts about hurting yourself or others. If you ever feel like you may hurt yourself or others, or have thoughts about taking your own life, get help right away. You can go to your nearest emergency department or call:  Your local emergency services (911 in the [place]  A suicide crisis helpline, such as the  National Suicide Prevention Lifeline at (936) 516-3205. This is open 24 hours a day. Summary  Grief is the result of a major change or an absence of someone or something that you count on. Grief is a normal reaction to loss.  The depth of grief and the period of recovery depend on the type of loss and your ability to adjust to the change and process your feelings.  Processing grief requires patience and a willingness to accept your feelings and talk about your loss with people who are supportive.  It is important to find resources that work for you and to realize that people experience grief differently. There is not one grieving process that works for everyone in the same way.  Be aware that when grief becomes extreme, it can lead to more severe issues like isolation, depression, anxiety, or suicidal thoughts. Talk with your health care provider if you have any of these issues. This information is not intended to replace advice given to you by your health care provider. Make sure you discuss any questions you have with your health care provider. Document Released: 12/02/2016 Document Revised: 09/22/2018 Document Reviewed: 12/02/2016 Elsevier Patient Education  2020 Elsevier Inc.   Complicated Grief Grief is a normal response to the death of someone close to you. Feelings of fear, anger, and guilt can affect almost everyone who loses a loved one. It is also common to have symptoms of depression while you are grieving. These include problems with sleep, loss of appetite, and lack of energy. They may last for weeks or months after a loss. Complicated grief is different from normal grief or depression. Normal grieving involves sadness and feelings of loss, but those feelings get better and heal over time. Complicated grief is a severe type of grief that lasts for a long time, usually for several months to a year or longer. It interferes with your ability to function normally. Complicated grief may  require treatment from a mental health care provider. What are the causes? The cause of this condition is not known. It is not clear why some people continue to struggle with grief and others do not. What increases the risk? You are more likely to develop this condition if:  The death of your loved one was sudden or unexpected.  The death of your loved one was due to a violent event.  Your loved one died from suicide.  Your loved one was a child or a young person.  You were very close to your loved one, or you were dependent on him or her.  You have a history of depression or anxiety. What are the signs or symptoms? Symptoms of this condition include:  Feeling disbelief or having a lack of emotion (numbness).  Being unable to enjoy good memories of your loved one.  Needing to avoid anything or anyone that reminds you of your loved one.  Being unable to stop thinking about the death.  Feeling intense anger or guilt.  Feeling alone and hopeless.  Feeling that your life is meaningless and empty.  Losing  the desire to move on with your life. How is this diagnosed? This condition may be diagnosed based on:  Your symptoms. Complicated grief will be diagnosed if you have ongoing symptoms of grief for 6-12 months or longer.  The effect of symptoms on your life. You may be diagnosed with this condition if your symptoms are interfering with your ability to live your life. Your health care provider may recommend that you see a mental health care provider. Many symptoms of depression are similar to the symptoms of complicated grief. It is important to be evaluated for complicated grief along with other mental health conditions. How is this treated? This condition is most commonly treated with talk therapy. This therapy is offered by a mental health specialist (psychiatrist). During therapy:  You will learn healthy ways to cope with the loss of your loved one.  Your mental health  care provider may recommend antidepressant medicines. Follow these instructions at home: Lifestyle   Take care of yourself. ? Eat on a regular basis, and maintain a healthy diet. Eat plenty of fruits, vegetables, lean protein, and whole grains. ? Try to get some exercise each day. Aim for 30 minutes of exercise on most days of the week. ? Keep a consistent sleep schedule. Try to get 8 or more hours of sleep each night. ? Start doing the things that you used to enjoy.  Do not use drugs or alcohol to ease your symptoms.  Spend time with friends and loved ones. General instructions  Take over-the-counter and prescription medicines only as told by your health care provider.  Consider joining a grief (bereavement) support group to help you deal with your loss.  Keep all follow-up visits as told by your health care provider. This is important. Contact a health care provider if:  Your symptoms prevent you from functioning normally.  Your symptoms do not get better with treatment. Get help right away if:  You have serious thoughts about hurting yourself or someone else.  You have suicidal feelings. If you ever feel like you may hurt yourself or others, or have thoughts about taking your own life, get help right away. You can go to your nearest emergency department or call:  Your local emergency services (911 in the [place]  A suicide crisis helpline, such as the [place] at (628)556-4165. This is open 24 hours a day. Summary  Complicated grief is a severe type of grief that lasts for a long time. This grief is not likely to go away on its own. Get the help you need.  Some griefs are more difficult than others and can cause this condition. You may need a certain type of treatment to help you recover if the loss of your loved one was sudden, violent, or due to suicide.  You may feel guilty about moving on with your life. Getting help does not mean that you  are forgetting your loved one. It means that you are taking care of yourself.  Complicated grief is best treated with talk therapy. Medicines may also be prescribed.  Seek the help you need, and find support that will help you recover. This information is not intended to replace advice given to you by your health care provider. Make sure you discuss any questions you have with your health care provider. Document Released: 07/19/2005 Document Revised: 07/01/2017 Document Reviewed: 05/04/2017 Elsevier Patient Education  2020 [employer].

## 2019-04-25 LAB — COMPREHENSIVE METABOLIC PANEL
ALT: 34 IU/L — ABNORMAL HIGH (ref 0–32)
AST: 19 IU/L (ref 0–40)
Albumin/Globulin Ratio: 2 (ref 1.2–2.2)
Albumin: 4.4 g/dL (ref 3.8–4.8)
Alkaline Phosphatase: 69 IU/L (ref 39–117)
BUN/Creatinine Ratio: 19 (ref 12–28)
BUN: 17 mg/dL (ref 8–27)
Bilirubin Total: 0.4 mg/dL (ref 0.0–1.2)
CO2: 23 mmol/L (ref 20–29)
Calcium: 9.6 mg/dL (ref 8.7–10.3)
Chloride: 98 mmol/L (ref 96–106)
Creatinine, Ser: 0.88 mg/dL (ref 0.57–1.00)
GFR calc Af Amer: 77 mL/min/{1.73_m2} (ref >59)
GFR calc non Af Amer: 67 mL/min/{1.73_m2} (ref >59)
Globulin, Total: 2.2 g/dL (ref 1.5–4.5)
Glucose: 334 mg/dL — ABNORMAL HIGH (ref 65–99)
Potassium: 4.1 mmol/L (ref 3.5–5.2)
Sodium: 135 mmol/L (ref 134–144)
Total Protein: 6.6 g/dL (ref 6.0–8.5)

## 2019-04-25 LAB — CBC WITH DIFFERENTIAL/PLATELET
Basophils Absolute: 0 10*3/uL (ref 0.0–0.2)
Basos: 0 %
EOS (ABSOLUTE): 0.1 10*3/uL (ref 0.0–0.4)
Eos: 1 %
Hematocrit: 44.2 % (ref 34.0–46.6)
Hemoglobin: 15.5 g/dL (ref 11.1–15.9)
Immature Grans (Abs): 0 10*3/uL (ref 0.0–0.1)
Immature Granulocytes: 0 %
Lymphocytes Absolute: 2 10*3/uL (ref 0.7–3.1)
Lymphs: 29 %
MCH: 31.5 pg (ref 26.6–33.0)
MCHC: 35.1 g/dL (ref 31.5–35.7)
MCV: 90 fL (ref 79–97)
Monocytes Absolute: 0.4 10*3/uL (ref 0.1–0.9)
Monocytes: 6 %
Neutrophils Absolute: 4.2 10*3/uL (ref 1.4–7.0)
Neutrophils: 64 %
Platelets: 193 10*3/uL (ref 150–450)
RBC: 4.92 x10E6/uL (ref 3.77–5.28)
RDW: 12.8 % (ref 11.7–15.4)
WBC: 6.7 10*3/uL (ref 3.4–10.8)

## 2019-04-25 LAB — LIPID PANEL
Chol/HDL Ratio: 4.9 ratio — ABNORMAL HIGH (ref 0.0–4.4)
Cholesterol, Total: 227 mg/dL — ABNORMAL HIGH (ref 100–199)
HDL: 46 mg/dL (ref >39)
LDL Chol Calc (NIH): 103 mg/dL — ABNORMAL HIGH (ref 0–99)
Triglycerides: 462 mg/dL — ABNORMAL HIGH (ref 0–149)
VLDL Cholesterol Cal: 78 mg/dL — ABNORMAL HIGH (ref 5–40)

## 2019-04-25 LAB — HEMOGLOBIN A1C
Est. average glucose Bld gHb Est-mCnc: 217 mg/dL
Hgb A1c MFr Bld: 9.2 % — ABNORMAL HIGH (ref 4.8–5.6)

## 2019-04-25 LAB — TSH: TSH: 3.57 u[IU]/mL (ref 0.450–4.500)

## 2019-04-27 ENCOUNTER — Telehealth: Payer: Self-pay

## 2019-04-27 NOTE — Telephone Encounter (Signed)
LMTCB 04/27/2019  Thanks,   -  

## 2019-04-27 NOTE — Telephone Encounter (Signed)
-----   Message from Mar Daring, [place] sent at 04/26/2019  1:18 PM EDT ----- Blood count is normal. Kidney function is normal. Liver enzymes have increased just slightly again. Sodium, potassium and calcium are normal. Thyroid is normal. Cholesterol has increased compared to previous years. A1c is up to 9.2. We will see how the [place] works, continue Januvia 100mg  as well. We will recheck in 3 months. Also make sure to be taking the simvastatin 10mg  for your cholesterol. If you are taking this we may need to increase to 20mg .

## 2019-04-27 NOTE — Telephone Encounter (Signed)
Patient notified of lab results. She states that she is taking her medication for the cholesterol but does not want to increase it more until she checks to make sure she is taking it as directed. She already has a follow up appointment in November.

## 2019-04-27 NOTE — Telephone Encounter (Signed)
noted 

## 2019-05-07 ENCOUNTER — Other Ambulatory Visit: Payer: Self-pay | Admitting: Physician Assistant

## 2019-05-07 DIAGNOSIS — E785 Hyperlipidemia, unspecified: Secondary | ICD-10-CM

## 2019-05-07 DIAGNOSIS — M1990 Unspecified osteoarthritis, unspecified site: Secondary | ICD-10-CM

## 2019-05-23 ENCOUNTER — Encounter: Payer: Self-pay | Admitting: Physician Assistant

## 2019-05-23 DIAGNOSIS — E039 Hypothyroidism, unspecified: Secondary | ICD-10-CM

## 2019-05-23 DIAGNOSIS — E119 Type 2 diabetes mellitus without complications: Secondary | ICD-10-CM

## 2019-05-23 DIAGNOSIS — I10 Essential (primary) hypertension: Secondary | ICD-10-CM

## 2019-05-24 MED ORDER — AMLODIPINE BESYLATE 5 MG PO TABS: 5.0000 mg | ORAL_TABLET | Freq: Every day | ORAL | 3 refills | Status: DC

## 2019-05-24 MED ORDER — LEVOTHYROXINE SODIUM 25 MCG PO TABS: ORAL_TABLET | ORAL | 3 refills | Status: DC

## 2019-05-24 MED ORDER — QUINAPRIL-HYDROCHLOROTHIAZIDE 20-25 MG PO TABS: 1.0000 | ORAL_TABLET | Freq: Every day | ORAL | 3 refills | Status: DC

## 2019-05-24 MED ORDER — SITAGLIPTIN PHOSPHATE 100 MG PO TABS: 100.0000 mg | ORAL_TABLET | Freq: Every day | ORAL | 3 refills | Status: DC

## 2019-06-25 ENCOUNTER — Other Ambulatory Visit: Payer: Self-pay

## 2019-06-25 ENCOUNTER — Ambulatory Visit: Payer: Self-pay | Admitting: Physician Assistant

## 2019-06-25 NOTE — Progress Notes (Deleted)
   {  Method of visit:23308}  Patient: Christine Clarke Female    DOB: 1950-09-15   68 y.o.   MRN: [id number] Visit Date: 06/25/2019  Today's Provider: Mar Daring, PA-C   No chief complaint on file.  Subjective:     HPI   Grief reaction, follow-up  The patient was last seen for grief reaction 2 months ago. Changes made since that visit include start Lexapro.Discussed counseling but patient declines at that time.  She reports {excellent/good/fair/poor:19665} compliance with treatment. She {ACTION; IS/IS VOH:[id number]} having side effects. ***.  ------------------------------------------------------------------------    Allergies  Allergen Reactions  . Sulfa Antibiotics Rash    Erythema multiforme  . Metformin And Related Diarrhea     Current Outpatient Medications:  .  amLODipine (NORVASC) 5 MG tablet, Take 1 tablet (5 mg total) by mouth daily., Disp: 90 tablet, Rfl: 3 .  Blood Glucose Monitoring Suppl ([facility]) w/Device KIT, To check BG once daily, Disp: 1 kit, Rfl: 0 .  BOTOX 100 units SOLR injection, , Disp: , Rfl:  .  dapagliflozin propanediol (FARXIGA) 10 MG TABS tablet, Take 10 mg by mouth daily before breakfast., Disp: 90 tablet, Rfl: 3 .  escitalopram (LEXAPRO) 10 MG tablet, Take 1 tablet (10 mg total) by mouth at bedtime., Disp: 90 tablet, Rfl: 1 .  fluticasone (FLONASE) 50 MCG/ACT nasal spray, USE 1 SPRAY INTO BOTH  NOSTRILS DAILY, Disp: 48 g, Rfl: 2 .  glucose blood (ONETOUCH VERIO) test strip, To check blood glucose daily, Disp: 100 each, Rfl: 12 .  levothyroxine (SYNTHROID) 25 MCG tablet, TAKE 1 TABLET BY MOUTH  DAILY BEFORE BREAKFAST.  TAKE 30 MINUTES BEFORE ANY  FOOD OR OTHER MEDICATIONS., Disp: 90 tablet, Rfl: 3 .  meloxicam (MOBIC) 15 MG tablet, TAKE 1 TABLET BY MOUTH  DAILY, Disp: 90 tablet, Rfl: 3 .  montelukast (SINGULAIR) 10 MG tablet, Take 1 tablet (10 mg total) by mouth daily., Disp: 90 tablet, Rfl: 4 .  ONETOUCH DELICA  LANCETS 26R MISC, Check blood sugar before  meals and at bedtime, Disp: 400 each, Rfl: 3 .  quinapril-hydrochlorothiazide (ACCURETIC) 20-25 MG tablet, Take 1 tablet by mouth daily., Disp: 90 tablet, Rfl: 3 .  simvastatin (ZOCOR) 10 MG tablet, TAKE 1 TABLET BY MOUTH  DAILY, Disp: 90 tablet, Rfl: 3 .  sitaGLIPtin (JANUVIA) 100 MG tablet, Take 1 tablet (100 mg total) by mouth daily., Disp: 90 tablet, Rfl: 3 .  triamcinolone cream (KENALOG) 0.1 %, Apply 1 application topically 2 (two) times daily., Disp: 30 g, Rfl: 0  Review of Systems  Social History   Tobacco Use  . Smoking status: Never Smoker  . Smokeless tobacco: Never Used  Substance Use Topics  . Alcohol use: Yes    Comment: occasional      Objective:   LMP  (LMP Unknown)  There were no vitals filed for this visit.There is no height or weight on file to calculate BMI.   Physical Exam   No results found for any visits on 06/25/19.     Assessment & [place], PA-C  [facility]

## 2019-06-26 ENCOUNTER — Ambulatory Visit: Payer: 59 | Admitting: Physician Assistant

## 2019-06-26 ENCOUNTER — Other Ambulatory Visit: Payer: Self-pay

## 2019-06-26 ENCOUNTER — Encounter: Payer: Self-pay | Admitting: Physician Assistant

## 2019-06-26 VITALS — BP 130/84 | HR 87 | Temp 97.1°F | Resp 18 | Wt 185.0 lb

## 2019-06-26 DIAGNOSIS — Z23 Encounter for immunization: Secondary | ICD-10-CM

## 2019-06-26 DIAGNOSIS — F4321 Adjustment disorder with depressed mood: Secondary | ICD-10-CM

## 2019-06-26 DIAGNOSIS — E1165 Type 2 diabetes mellitus with hyperglycemia: Secondary | ICD-10-CM | POA: Diagnosis not present

## 2019-06-26 NOTE — Progress Notes (Signed)
° ° ° ° ° °Patient: Christine Clarke Female    DOB: 04/05/1951   68 y.o.   MRN: [id number] °Visit Date: 06/26/2019 ° °Today's Provider: Jennifer M Burnette, PA-C  ° °Chief Complaint  °Patient presents with  °• Follow-up  ° °Subjective:  °  ° °HPI  ° °Grief reaction °From 04/24/2019-started Lexapro 10 mg at bedtime,  for grief. Discussed counseling but patient declines at this time. I will see her back in 2 months to see how she is doing.  ° °Patient reports she has not been taking nightly as prescribed. Still has some difficulties with sleep. Also still having good and bad days. Noticing some "brain fog" and having issues with typing words incorrectly. Feels she is rushing through.  ° °  °· Diabetes Mellitus Type II, Follow-up:  ° °Lab Results  °Component Value Date  ° HGBA1C 9.2 (H) 04/24/2019  ° HGBA1C 8.6 (H) 03/13/2018  ° HGBA1C 7.2 (H) 02/10/2017  ° °Last seen for diabetes 2 months ago.  °Management since then includes; changed to Farxiga 10 mg qd. °She reports good compliance with treatment. °She is not having side effects. none °Current symptoms include none and have been unchanged. °Home blood sugar records: fasting range: 177 ° °Episodes of hypoglycemia? no °  °Current Insulin Regimen: n/a °Most Recent Eye Exam: not done recently, knows she is due °Weight trend: stable °Prior visit with dietician: no °Current diet: in general, an "unhealthy" diet °Current exercise: none ° °----------------------------------------------------------- ° ° °Allergies  °Allergen Reactions  °• Sulfa Antibiotics Rash  °  Erythema multiforme  °• Metformin And Related Diarrhea  ° ° ° °Current Outpatient Medications:  °•  amLODipine (NORVASC) 5 MG tablet, Take 1 tablet (5 mg total) by mouth daily., Disp: 90 tablet, Rfl: 3 °•  Blood Glucose Monitoring Suppl (ONETOUCH VERIO FLEX SYSTEM) w/Device KIT, To check BG once daily, Disp: 1 kit, Rfl: 0 °•  BOTOX 100 units SOLR injection, , Disp: , Rfl:  °•  dapagliflozin propanediol (FARXIGA) 10 MG  TABS tablet, Take 10 mg by mouth daily before breakfast., Disp: 90 tablet, Rfl: 3 °•  escitalopram (LEXAPRO) 10 MG tablet, Take 1 tablet (10 mg total) by mouth at bedtime., Disp: 90 tablet, Rfl: 1 °•  fluticasone (FLONASE) 50 MCG/ACT nasal spray, USE 1 SPRAY INTO BOTH  NOSTRILS DAILY, Disp: 48 g, Rfl: 2 °•  glucose blood (ONETOUCH VERIO) test strip, To check blood glucose daily, Disp: 100 each, Rfl: 12 °•  levothyroxine (SYNTHROID) 25 MCG tablet, TAKE 1 TABLET BY MOUTH  DAILY BEFORE BREAKFAST.  TAKE 30 MINUTES BEFORE ANY  FOOD OR OTHER MEDICATIONS., Disp: 90 tablet, Rfl: 3 °•  meloxicam (MOBIC) 15 MG tablet, TAKE 1 TABLET BY MOUTH  DAILY, Disp: 90 tablet, Rfl: 3 °•  montelukast (SINGULAIR) 10 MG tablet, Take 1 tablet (10 mg total) by mouth daily., Disp: 90 tablet, Rfl: 4 °•  ONETOUCH DELICA LANCETS 33G MISC, Check blood sugar before  meals and at bedtime, Disp: 400 each, Rfl: 3 °•  quinapril-hydrochlorothiazide (ACCURETIC) 20-25 MG tablet, Take 1 tablet by mouth daily., Disp: 90 tablet, Rfl: 3 °•  simvastatin (ZOCOR) 10 MG tablet, TAKE 1 TABLET BY MOUTH  DAILY, Disp: 90 tablet, Rfl: 3 °•  sitaGLIPtin (JANUVIA) 100 MG tablet, Take 1 tablet (100 mg total) by mouth daily., Disp: 90 tablet, Rfl: 3 °•  triamcinolone cream (KENALOG) 0.1 %, Apply 1 application topically 2 (two) times daily., Disp: 30 g, Rfl: 0 ° °Review of Systems  °Constitutional:   Constitutional: Negative for appetite change, chills, fatigue and fever.  Respiratory: Negative for chest tightness and shortness of breath.   Cardiovascular: Negative for chest pain and palpitations.  Gastrointestinal: Negative for abdominal pain, nausea and vomiting.  Endocrine: Negative for polydipsia, polyphagia and polyuria.  Neurological: Negative for dizziness and weakness.  Psychiatric/Behavioral: Positive for decreased concentration, dysphoric mood and sleep disturbance.    Social History   Tobacco Use   Smoking status: Never Smoker   Smokeless tobacco: Never Used    Substance Use Topics   Alcohol use: Yes    Comment: occasional      Objective:   BP 130/84 (BP Location: Right Arm, Patient Position: Sitting, Cuff Size: Large)    Pulse 87    Temp (!) 97.1 F (36.2 C) (Other (Comment))    Resp 18    Wt 185 lb (83.9 kg)    LMP  (LMP Unknown)    SpO2 94%    BMI 37.37 kg/m  Vitals:   06/26/19 1415  BP: 130/84  Pulse: 87  Resp: 18  Temp: (!) 97.1 F (36.2 C)  TempSrc: Other (Comment)  SpO2: 94%  Weight: 185 lb (83.9 kg)  Body mass index is 37.37 kg/m.   Physical Exam Vitals signs reviewed.  Constitutional:      General: She is not in acute distress.    Appearance: Normal appearance. She is well-developed. She is obese. She is not diaphoretic.  Neck:     Musculoskeletal: Normal range of motion and neck supple.  Cardiovascular:     Rate and Rhythm: Normal rate and regular rhythm.     Heart sounds: Normal heart sounds. No murmur. No friction rub. No gallop.   Pulmonary:     Effort: Pulmonary effort is normal. No respiratory distress.     Breath sounds: Normal breath sounds. No wheezing or rales.  Neurological:     Mental Status: She is alert.      No results found for any visits on 06/26/19.     Assessment & Plan    1. Type 2 diabetes mellitus with hyperglycemia, without long-term current use of insulin (HCC) Continue Farxiga 44m and Januvia 1038m Patient admits she had not previously been taking these regularly but is now. She will return in 3 months for A1c.   2. Grief Not taking Lexapro regularly. Advised this has to be taken daily. She will start. F/U in 3 months.   3. Need for shingles vaccine Shingrix #2 Vaccine given to patient without complications. Patient sat for 15 minutes after administration and was tolerated well without adverse effects. - Varicella-zoster vaccine IM (Shingrix)     JeMar DaringPA-C  [facility]

## 2019-06-26 NOTE — Patient Instructions (Signed)
Immuneti vitamin pills

## 2019-08-20 ENCOUNTER — Encounter: Payer: Self-pay | Admitting: Physician Assistant

## 2019-09-05 LAB — HM DIABETES EYE EXAM

## 2019-09-07 ENCOUNTER — Encounter: Payer: Self-pay | Admitting: Physician Assistant

## 2019-09-07 ENCOUNTER — Other Ambulatory Visit: Payer: Self-pay | Admitting: Physician Assistant

## 2019-09-07 DIAGNOSIS — E785 Hyperlipidemia, unspecified: Secondary | ICD-10-CM

## 2019-09-07 DIAGNOSIS — M1990 Unspecified osteoarthritis, unspecified site: Secondary | ICD-10-CM

## 2019-09-07 MED ORDER — MELOXICAM 15 MG PO TABS: 15.0000 mg | ORAL_TABLET | Freq: Every day | ORAL | 3 refills | Status: DC

## 2019-09-07 MED ORDER — SIMVASTATIN 10 MG PO TABS: 10.0000 mg | ORAL_TABLET | Freq: Every day | ORAL | 3 refills | Status: DC

## 2019-09-07 NOTE — Telephone Encounter (Signed)
Medication Refill - Medication:   meloxicam (MOBIC) 15 MG tablet [[id number]] simvastatin (ZOCOR) 10 MG tablet [[id number]]  90 day supply  Has the patient contacted their pharmacy? No. (Agent: If no, request that the patient contact the pharmacy for the refill.) (Agent: If yes, when and what did the pharmacy advise?)  Preferred Pharmacy (with phone number or street name): [facility] on file    Agent: Please be advised that RX refills may take up to 3 business days. We ask that you follow-up with your pharmacy.

## 2019-09-07 NOTE — Telephone Encounter (Signed)
Please advise? Last office visit 06/26/19.

## 2019-09-10 ENCOUNTER — Encounter: Payer: Self-pay | Admitting: Physician Assistant

## 2019-10-31 ENCOUNTER — Encounter: Payer: Self-pay | Admitting: Physician Assistant

## 2019-10-31 DIAGNOSIS — E119 Type 2 diabetes mellitus without complications: Secondary | ICD-10-CM

## 2019-11-01 MED ORDER — CONTOUR NEXT ONE KIT: 1.0000 | PACK | Freq: Every day | 0 refills | Status: AC

## 2019-11-01 MED ORDER — CONTOUR NEXT TEST VI STRP: ORAL_STRIP | 12 refills | Status: DC

## 2019-11-01 MED ORDER — DAPAGLIFLOZIN PROPANEDIOL 10 MG PO TABS: 10.0000 mg | ORAL_TABLET | Freq: Every day | ORAL | 3 refills | Status: DC

## 2019-11-01 MED ORDER — LANCETS 30G MISC: 12 refills | Status: DC

## 2020-01-08 ENCOUNTER — Other Ambulatory Visit: Payer: Self-pay | Admitting: Physician Assistant

## 2020-01-08 NOTE — Telephone Encounter (Signed)
Contacted pt to discuss need for Azithormycin; she states her teeth hurt, she has drainage down her throat, and this makes her cough; she states this started about a week ago; she denies fever; the pt offered opportunity to make an appt for evaluation but she declines; she would like for her request to be sent to her PCP; the pt sees Joycelyn Man, [facility]; the pt can be contacted at [phone].

## 2020-01-08 NOTE — Telephone Encounter (Signed)
Will need at least a virtual appt. Not seen since 06/2019.  Patient due for her A1c check anyway

## 2020-01-08 NOTE — Telephone Encounter (Signed)
Copied from CRM [id number]. Topic: Quick Communication - Rx Refill/Question >> Jan 08, 2020 10:26 AM Dalphine Handing A wrote: Medication: azithromycin (ZITHROMAX) 250 MG tablet  Has the patient contacted their pharmacy? yes (Agent: If no, request that the patient contact the pharmacy for the refill.) (Agent: If yes, when and what did the pharmacy advise?)contact pcp  Preferred Pharmacy (with phone number or street name): [id number] [place], [place] - [address] AT [facility] OF [address] [facility]  Phone:  [phone] Fax:  [phone]     Agent: Please be advised that RX refills may take up to 3 business days. We ask that you follow-up with your pharmacy.

## 2020-01-09 ENCOUNTER — Encounter: Payer: Self-pay | Admitting: Physician Assistant

## 2020-01-09 NOTE — Telephone Encounter (Signed)
Pt calling in again regarding medication. Pt states that she is not willing to set up an appt. I relayed message from PCP regarding appt and pt began to get upset and requested to speak with PCP or her nurse. Pt states that she is expecting a call today. Please advise.

## 2020-01-09 NOTE — Telephone Encounter (Signed)
I called and spoke with patient advising her of the message below again. Patient states "this is ridiculous". She states she has had antibiotics called in many times before without an appointment. Patient agreed to schedule a virtual visit for tomorrow. The only appointment slot available was an [facility] follow up. I used this slot to schedule patient an appointment for evaluation of possible sinus infection. I advised patient that Jenni's nurse would contact her in the morning to let her know if this time slot was ok to use. Please call patient asap in the morning to let her know if 11am slot is ok. She has Clinical cytogeneticist but says her Internet goes out sometimes, so the visit may have to be a telephone visit.

## 2020-01-10 ENCOUNTER — Telehealth (INDEPENDENT_AMBULATORY_CARE_PROVIDER_SITE_OTHER): Payer: 59 | Admitting: Physician Assistant

## 2020-01-10 ENCOUNTER — Encounter: Payer: Self-pay | Admitting: Physician Assistant

## 2020-01-10 VITALS — Temp 96.6°F | Wt 190.0 lb

## 2020-01-10 DIAGNOSIS — M1990 Unspecified osteoarthritis, unspecified site: Secondary | ICD-10-CM | POA: Diagnosis not present

## 2020-01-10 DIAGNOSIS — E7800 Pure hypercholesterolemia, unspecified: Secondary | ICD-10-CM

## 2020-01-10 DIAGNOSIS — E119 Type 2 diabetes mellitus without complications: Secondary | ICD-10-CM | POA: Diagnosis not present

## 2020-01-10 DIAGNOSIS — J0140 Acute pansinusitis, unspecified: Secondary | ICD-10-CM

## 2020-01-10 MED ORDER — DAPAGLIFLOZIN PROPANEDIOL 10 MG PO TABS: 10.0000 mg | ORAL_TABLET | Freq: Every day | ORAL | 3 refills | Status: DC

## 2020-01-10 MED ORDER — SIMVASTATIN 10 MG PO TABS: 10.0000 mg | ORAL_TABLET | Freq: Every day | ORAL | 3 refills | Status: DC

## 2020-01-10 MED ORDER — AMOXICILLIN-POT CLAVULANATE 875-125 MG PO TABS: 1.0000 | ORAL_TABLET | Freq: Two times a day (BID) | ORAL | 0 refills | Status: DC

## 2020-01-10 MED ORDER — AZITHROMYCIN 250 MG PO TABS: ORAL_TABLET | ORAL | 0 refills | Status: DC

## 2020-01-10 MED ORDER — MELOXICAM 15 MG PO TABS: 15.0000 mg | ORAL_TABLET | Freq: Every day | ORAL | 3 refills | Status: DC

## 2020-01-10 NOTE — Telephone Encounter (Signed)
Called to advise patient that they scheduled her for an 11am virtual visit appointment, and to call us back to let us know if she was able to do a mychart or virtual visit.

## 2020-01-10 NOTE — Progress Notes (Signed)
Virtual telephone visit    Virtual Visit via Telephone Note   This visit type was conducted due to national recommendations for restrictions regarding the COVID-19 Pandemic (e.g. social distancing) in an effort to limit this patient's exposure and mitigate transmission in our community. Due to her co-morbid illnesses, this patient is at least at moderate risk for complications without adequate follow up. This format is felt to be most appropriate for this patient at this time. The patient did not have access to video technology or had technical difficulties with video requiring transitioning to audio format only (telephone). Physical exam was limited to content and character of the telephone converstion.    Patient location: Home Provider location: [facility]   Visit Date: 01/10/2020  Today's healthcare provider: Mar Daring, PA-C   Chief Complaint  Patient presents with  . Sinus Problem   Subjective    Sinus Problem This is a new problem. The current episode started 1 to 4 weeks ago. The problem has been gradually worsening since onset. There has been no fever. Associated symptoms include coughing and sinus pressure. Pertinent negatives include no ear pain, headaches or sore throat. Treatments tried: nasal spray and allegra. The treatment provided no relief.   Need refill on Simvastatin and Meloxicam to be send to [facility].  Patient Active Problem List   Diagnosis Date Noted  . Metabolic syndrome 08/10/3233  . Diabetes mellitus without complication ([place]) 57/32/2025  . Eustachian tube dysfunction 07/18/2015  . BMI 40.0-44.9, adult ([place]) 07/17/2015  . Hot flash, menopausal 07/17/2015  . Disease of eyelid 07/17/2015  . Arthritis, degenerative 07/17/2015  . Skin nodule 07/17/2015  . Avitaminosis D 05/07/2015  . HLD (hyperlipidemia) 05/07/2015  . Allergic rhinitis, seasonal 03/19/2009  . Hypothyroidism 03/19/2009  . Low back pain 02/24/2009  . Arthropathy of  temporomandibular joint 02/24/2009  . Essential (primary) hypertension 05/18/2000   Past Medical History:  Diagnosis Date  . Allergy   . Diabetes mellitus without complication ([place])   . Hyperlipidemia   . Hypertension       Medications: Outpatient Medications Prior to Visit  Medication Sig  . amLODipine (NORVASC) 5 MG tablet Take 1 tablet (5 mg total) by mouth daily.  Marland Kitchen BOTOX 100 units SOLR injection   . escitalopram (LEXAPRO) 10 MG tablet Take 1 tablet (10 mg total) by mouth at bedtime.  . fluticasone (FLONASE) 50 MCG/ACT nasal spray USE 1 SPRAY INTO BOTH  NOSTRILS DAILY  . levothyroxine (SYNTHROID) 25 MCG tablet TAKE 1 TABLET BY MOUTH  DAILY BEFORE BREAKFAST.  TAKE 30 MINUTES BEFORE ANY  FOOD OR OTHER MEDICATIONS.  Marland Kitchen montelukast (SINGULAIR) 10 MG tablet Take 1 tablet (10 mg total) by mouth daily.  . quinapril-hydrochlorothiazide (ACCURETIC) 20-25 MG tablet Take 1 tablet by mouth daily.  . sitaGLIPtin (JANUVIA) 100 MG tablet Take 1 tablet (100 mg total) by mouth daily.  Marland Kitchen triamcinolone cream (KENALOG) 0.1 % Apply 1 application topically 2 (two) times daily.  . [DISCONTINUED] dapagliflozin propanediol (FARXIGA) 10 MG TABS tablet Take 10 mg by mouth daily before breakfast.  . [DISCONTINUED] meloxicam (MOBIC) 15 MG tablet Take 1 tablet (15 mg total) by mouth daily.  . [DISCONTINUED] simvastatin (ZOCOR) 10 MG tablet Take 1 tablet (10 mg total) by mouth daily.  . Blood Glucose Monitoring Suppl (CONTOUR NEXT ONE) KIT 1 kit by Does not apply route daily. To check blood sugar daily (Patient not taking: Reported on 01/10/2020)  . glucose blood (CONTOUR NEXT TEST) test strip To check  blood sugar daily (Patient not taking: Reported on 01/10/2020)  . Lancets 30G MISC To check blood sugar daily (Patient not taking: Reported on 01/10/2020)  . ONETOUCH DELICA LANCETS 28U MISC Check blood sugar before  meals and at bedtime (Patient not taking: Reported on 01/10/2020)   No facility-administered  medications prior to visit.    Review of Systems  HENT: Positive for postnasal drip, sinus pressure and sinus pain. Negative for ear pain and sore throat.   Respiratory: Positive for cough.   Neurological: Negative for headaches.    Last CBC Lab Results  Component Value Date   WBC 6.7 04/24/2019   HGB 15.5 04/24/2019   HCT 44.2 04/24/2019   MCV 90 04/24/2019   MCH 31.5 04/24/2019   RDW 12.8 04/24/2019   PLT 193 13/24/4010   Last metabolic panel Lab Results  Component Value Date   GLUCOSE 334 (H) 04/24/2019   NA 135 04/24/2019   K 4.1 04/24/2019   CL 98 04/24/2019   CO2 23 04/24/2019   BUN 17 04/24/2019   CREATININE 0.88 04/24/2019   GFRNONAA 67 04/24/2019   GFRAA 77 04/24/2019   CALCIUM 9.6 04/24/2019   PROT 6.6 04/24/2019   ALBUMIN 4.4 04/24/2019   LABGLOB 2.2 04/24/2019   AGRATIO 2.0 04/24/2019   BILITOT 0.4 04/24/2019   ALKPHOS 69 04/24/2019   AST 19 04/24/2019   ALT 34 (H) 04/24/2019      Objective    Temp (!) 96.6 F (35.9 C) (Temporal)   Wt 190 lb (86.2 kg) Comment: per patient  LMP  (LMP Unknown)   BMI 38.38 kg/m  BP Readings from Last 3 Encounters:  06/26/19 130/84  04/24/19 (!) 144/84  07/04/18 140/88   Wt Readings from Last 3 Encounters:  01/10/20 190 lb (86.2 kg)  06/26/19 185 lb (83.9 kg)  04/24/19 187 lb (84.8 kg)        Assessment & Plan     1. Acute non-recurrent pansinusitis Worsening symptoms that have not responded to OTC medications. Will give Zpak as below. Continue allergy medications. Stay well hydrated and get plenty of rest. Call if no symptom improvement or if symptoms worsen. - azithromycin (ZITHROMAX) 250 MG tablet; Take 2 tablets PO on day one, and one tablet PO daily thereafter until completed.  Dispense: 6 tablet; Refill: 0  2. Osteoarthritis, unspecified osteoarthritis type, unspecified site Stable. Diagnosis pulled for medication refill. Continue current medical treatment plan. Due for CPE in 03/2020. - meloxicam  (MOBIC) 15 MG tablet; Take 1 tablet (15 mg total) by mouth daily.  Dispense: 90 tablet; Refill: 3  3. HLD (hyperlipidemia) Stable. Diagnosis pulled for medication refill. Continue current medical treatment plan. - simvastatin (ZOCOR) 10 MG tablet; Take 1 tablet (10 mg total) by mouth daily.  Dispense: 90 tablet; Refill: 3  4. Type 2 diabetes mellitus without complication, without long-term current use of insulin (H[facility]) Stable. Diagnosis pulled for medication refill. Continue current medical treatment plan. - dapagliflozin propanediol (FARXIGA) 10 MG TABS tablet; Take 1 tablet (10 mg total) by mouth daily before breakfast.  Dispense: 90 tablet; Refill: 3   No follow-ups on file.    I discussed the assessment and treatment plan with the patient. The patient was provided an opportunity to ask questions and all were answered. The patient agreed with the plan and demonstrated an understanding of the instructions.   The patient was advised to call back or seek an in-person evaluation if the symptoms worsen or if the condition fails  to improve as anticipated.  I provided 12 minutes of non-face-to-face time during this encounter.  Reynolds Bowl, PA-C, have reviewed all documentation for this visit. The documentation on 01/10/20 for the exam, diagnosis, procedures, and orders are all accurate and complete.   [place] [facility] [phone] (phone) [phone] (fax)  [place]

## 2020-01-10 NOTE — Patient Instructions (Signed)
Sinusitis, Adult Sinusitis is inflammation of your sinuses. Sinuses are hollow spaces in the bones around your face. Your sinuses are located:  Around your eyes.  In the middle of your forehead.  Behind your nose.  In your cheekbones. Mucus normally drains out of your sinuses. When your nasal tissues become inflamed or swollen, mucus can become trapped or blocked. This allows bacteria, viruses, and fungi to grow, which leads to infection. Most infections of the sinuses are caused by a virus. Sinusitis can develop quickly. It can last for up to 4 weeks (acute) or for more than 12 weeks (chronic). Sinusitis often develops after a cold. What are the causes? This condition is caused by anything that creates swelling in the sinuses or stops mucus from draining. This includes:  Allergies.  Asthma.  Infection from bacteria or viruses.  Deformities or blockages in your nose or sinuses.  Abnormal growths in the nose (nasal polyps).  Pollutants, such as chemicals or irritants in the air.  Infection from fungi (rare). What increases the risk? You are more likely to develop this condition if you:  Have a weak body defense system (immune system).  Do a lot of swimming or diving.  Overuse nasal sprays.  Smoke. What are the signs or symptoms? The main symptoms of this condition are pain and a feeling of pressure around the affected sinuses. Other symptoms include:  Stuffy nose or congestion.  Thick drainage from your nose.  Swelling and warmth over the affected sinuses.  Headache.  Upper toothache.  A cough that may get worse at night.  Extra mucus that collects in the throat or the back of the nose (postnasal drip).  Decreased sense of smell and taste.  Fatigue.  A fever.  Sore throat.  Bad breath. How is this diagnosed? This condition is diagnosed based on:  Your symptoms.  Your medical history.  A physical exam.  Tests to find out if your condition is  acute or chronic. This may include: ? Checking your nose for nasal polyps. ? Viewing your sinuses using a device that has a light (endoscope). ? Testing for allergies or bacteria. ? Imaging tests, such as an MRI or CT scan. In rare cases, a bone biopsy may be done to rule out more serious types of fungal sinus disease. How is this treated? Treatment for sinusitis depends on the cause and whether your condition is chronic or acute.  If caused by a virus, your symptoms should go away on their own within 10 days. You may be given medicines to relieve symptoms. They include: ? Medicines that shrink swollen nasal passages (topical intranasal decongestants). ? Medicines that treat allergies (antihistamines). ? A spray that eases inflammation of the nostrils (topical intranasal corticosteroids). ? Rinses that help get rid of thick mucus in your nose (nasal saline washes).  If caused by bacteria, your health care provider may recommend waiting to see if your symptoms improve. Most bacterial infections will get better without antibiotic medicine. You may be given antibiotics if you have: ? A severe infection. ? A weak immune system.  If caused by narrow nasal passages or nasal polyps, you may need to have surgery. Follow these instructions at home: Medicines  Take, use, or apply over-the-counter and prescription medicines only as told by your health care provider. These may include nasal sprays.  If you were prescribed an antibiotic medicine, take it as told by your health care provider. Do not stop taking the antibiotic even if you start   to feel better. Hydrate and humidify   Drink enough fluid to keep your urine pale yellow. Staying hydrated will help to thin your mucus.  Use a cool mist humidifier to keep the humidity level in your home above 50%.  Inhale steam for 10-15 minutes, 3-4 times a day, or as told by your health care provider. You can do this in the bathroom while a hot shower is  running.  Limit your exposure to cool or dry air. Rest  Rest as much as possible.  Sleep with your head raised (elevated).  Make sure you get enough sleep each night. General instructions   Apply a warm, moist washcloth to your face 3-4 times a day or as told by your health care provider. This will help with discomfort.  Wash your hands often with soap and water to reduce your exposure to germs. If soap and water are not available, use hand sanitizer.  Do not smoke. Avoid being around people who are smoking (secondhand smoke).  Keep all follow-up visits as told by your health care provider. This is important. Contact a health care provider if:  You have a fever.  Your symptoms get worse.  Your symptoms do not improve within 10 days. Get help right away if:  You have a severe headache.  You have persistent vomiting.  You have severe pain or swelling around your face or eyes.  You have vision problems.  You develop confusion.  Your neck is stiff.  You have trouble breathing. Summary  Sinusitis is soreness and inflammation of your sinuses. Sinuses are hollow spaces in the bones around your face.  This condition is caused by nasal tissues that become inflamed or swollen. The swelling traps or blocks the flow of mucus. This allows bacteria, viruses, and fungi to grow, which leads to infection.  If you were prescribed an antibiotic medicine, take it as told by your health care provider. Do not stop taking the antibiotic even if you start to feel better.  Keep all follow-up visits as told by your health care provider. This is important. This information is not intended to replace advice given to you by your health care provider. Make sure you discuss any questions you have with your health care provider. Document Revised: 12/19/2017 Document Reviewed: 12/19/2017 [employer] Patient Education  2020 [employer]  

## 2020-01-11 ENCOUNTER — Telehealth: Payer: Self-pay | Admitting: Physician Assistant

## 2020-02-11 ENCOUNTER — Other Ambulatory Visit: Payer: Self-pay | Admitting: Physician Assistant

## 2020-02-11 DIAGNOSIS — F4321 Adjustment disorder with depressed mood: Secondary | ICD-10-CM

## 2020-03-28 ENCOUNTER — Other Ambulatory Visit: Payer: Self-pay | Admitting: Physician Assistant

## 2020-03-28 DIAGNOSIS — J0140 Acute pansinusitis, unspecified: Secondary | ICD-10-CM

## 2020-04-07 ENCOUNTER — Encounter: Payer: Self-pay | Admitting: Physician Assistant

## 2020-04-07 DIAGNOSIS — J301 Allergic rhinitis due to pollen: Secondary | ICD-10-CM

## 2020-04-07 DIAGNOSIS — F4321 Adjustment disorder with depressed mood: Secondary | ICD-10-CM

## 2020-04-07 DIAGNOSIS — M1990 Unspecified osteoarthritis, unspecified site: Secondary | ICD-10-CM

## 2020-04-07 DIAGNOSIS — E119 Type 2 diabetes mellitus without complications: Secondary | ICD-10-CM

## 2020-04-07 DIAGNOSIS — I10 Essential (primary) hypertension: Secondary | ICD-10-CM

## 2020-04-08 ENCOUNTER — Other Ambulatory Visit: Payer: Self-pay | Admitting: *Deleted

## 2020-04-08 DIAGNOSIS — J301 Allergic rhinitis due to pollen: Secondary | ICD-10-CM

## 2020-04-08 DIAGNOSIS — E039 Hypothyroidism, unspecified: Secondary | ICD-10-CM

## 2020-04-08 DIAGNOSIS — E119 Type 2 diabetes mellitus without complications: Secondary | ICD-10-CM

## 2020-04-08 DIAGNOSIS — I10 Essential (primary) hypertension: Secondary | ICD-10-CM

## 2020-04-08 MED ORDER — QUINAPRIL-HYDROCHLOROTHIAZIDE 20-25 MG PO TABS: 1.0000 | ORAL_TABLET | Freq: Every day | ORAL | 3 refills | Status: DC

## 2020-04-08 MED ORDER — ESCITALOPRAM OXALATE 10 MG PO TABS: 10.0000 mg | ORAL_TABLET | Freq: Every day | ORAL | 3 refills | Status: DC

## 2020-04-08 MED ORDER — MELOXICAM 15 MG PO TABS: 15.0000 mg | ORAL_TABLET | Freq: Every day | ORAL | 3 refills | Status: AC

## 2020-04-08 MED ORDER — FLUTICASONE PROPIONATE 50 MCG/ACT NA SUSP: NASAL | 2 refills | Status: DC

## 2020-04-08 MED ORDER — MONTELUKAST SODIUM 10 MG PO TABS: 10.0000 mg | ORAL_TABLET | Freq: Every day | ORAL | 3 refills | Status: DC

## 2020-04-08 MED ORDER — SITAGLIPTIN PHOSPHATE 100 MG PO TABS: 100.0000 mg | ORAL_TABLET | Freq: Every day | ORAL | 3 refills | Status: DC

## 2020-04-08 MED ORDER — AMLODIPINE BESYLATE 5 MG PO TABS: 5.0000 mg | ORAL_TABLET | Freq: Every day | ORAL | 3 refills | Status: DC

## 2020-04-08 MED ORDER — LEVOTHYROXINE SODIUM 25 MCG PO TABS: ORAL_TABLET | ORAL | 3 refills | Status: DC

## 2020-05-20 NOTE — Progress Notes (Signed)
Complete physical exam   Patient: Christine Clarke   DOB: 1951-02-11   69 y.o. Female  MRN: [id number] Visit Date: 05/21/2020  Today's healthcare provider: Mar Daring, PA-C   Chief Complaint  Patient presents with  . Annual Exam   Subjective    Christine Clarke is a 69 y.o. female who presents today for a complete physical exam.  She reports consuming a general diet. The patient does not participate in regular exercise at present. She generally feels fairly well. She reports sleeping fairly well. She does have additional problems to discuss today.  HPI  She has a wellness screening appeal form to be filled  Having hair loss. Ongoing for about 6-8 months. Does have some itching of the scalp but no flakes, dryness or sores noted.   Has no energy. Been worsening over the last year.  Increased urination and increased thirst.   Past Medical History:  Diagnosis Date  . Allergy   . Diabetes mellitus without complication ([place])   . Hyperlipidemia   . Hypertension    Past Surgical History:  Procedure Laterality Date  . ABDOMINAL HYSTERECTOMY  1981   partial  . BLADDER SURGERY  1981  . BREAST EXCISIONAL BIOPSY Left 1998   Social History   Socioeconomic History  . Marital status: Divorced    Spouse name: Not on file  . Number of children: Not on file  . Years of education: Not on file  . Highest education level: Not on file  Occupational History  . Not on file  Tobacco Use  . Smoking status: Never Smoker  . Smokeless tobacco: Never Used  Vaping Use  . Vaping Use: Never used  Substance and Sexual Activity  . Alcohol use: Yes    Comment: occasional  . Drug use: No  . Sexual activity: Not on file  Other Topics Concern  . Not on file  Social History Narrative  . Not on file   Social Determinants of Health   Financial Resource Strain:   . Difficulty of Paying Living Expenses: Not on file  Food Insecurity:   . Worried About Charity fundraiser in the Last  Year: Not on file  . Ran Out of Food in the Last Year: Not on file  Transportation Needs:   . Lack of Transportation (Medical): Not on file  . Lack of Transportation (Non-Medical): Not on file  Physical Activity:   . Days of Exercise per Week: Not on file  . Minutes of Exercise per Session: Not on file  Stress:   . Feeling of Stress : Not on file  Social Connections:   . Frequency of Communication with Friends and Family: Not on file  . Frequency of Social Gatherings with Friends and Family: Not on file  . Attends Religious Services: Not on file  . Active Member of Clubs or Organizations: Not on file  . Attends Archivist Meetings: Not on file  . Marital Status: Not on file  Intimate Partner Violence:   . Fear of Current or Ex-Partner: Not on file  . Emotionally Abused: Not on file  . Physically Abused: Not on file  . Sexually Abused: Not on file   Family Status  Relation Name Status  . Mother  Deceased at age 33's       stroke  . Father  Deceased at age 66's       pneumonia  . Neg Hx  (Not Specified)   Family  History  Problem Relation Age of Onset  . Hypertension Mother   . Breast cancer Neg Hx    Allergies  Allergen Reactions  . Sulfa Antibiotics Rash    Erythema multiforme  . Metformin And Related Diarrhea    Patient Care Team: Rubye Beach as PCP - General (Family Medicine)   Medications: Outpatient Medications Prior to Visit  Medication Sig  . amLODipine (NORVASC) 5 MG tablet Take 1 tablet (5 mg total) by mouth daily.  Marland Kitchen azithromycin (ZITHROMAX) 250 MG tablet Take 2 tablets PO on day one, and one tablet PO daily thereafter until completed.  . Blood Glucose Monitoring Suppl (CONTOUR NEXT ONE) KIT 1 kit by Does not apply route daily. To check blood sugar daily (Patient not taking: Reported on 01/10/2020)  . BOTOX 100 units SOLR injection   . dapagliflozin propanediol (FARXIGA) 10 MG TABS tablet Take 1 tablet (10 mg total) by mouth daily  before breakfast.  . escitalopram (LEXAPRO) 10 MG tablet Take 1 tablet (10 mg total) by mouth at bedtime.  . fluticasone (FLONASE) 50 MCG/ACT nasal spray USE 1 SPRAY INTO BOTH  NOSTRILS DAILY  . glucose blood (CONTOUR NEXT TEST) test strip To check blood sugar daily (Patient not taking: Reported on 01/10/2020)  . Lancets 30G MISC To check blood sugar daily (Patient not taking: Reported on 01/10/2020)  . levothyroxine (SYNTHROID) 25 MCG tablet TAKE 1 TABLET BY MOUTH  DAILY BEFORE BREAKFAST.  TAKE 30 MINUTES BEFORE ANY  FOOD OR OTHER MEDICATIONS.  Marland Kitchen meloxicam (MOBIC) 15 MG tablet Take 1 tablet (15 mg total) by mouth daily.  . montelukast (SINGULAIR) 10 MG tablet Take 1 tablet (10 mg total) by mouth daily.  Glory Rosebush DELICA LANCETS 58I MISC Check blood sugar before  meals and at bedtime (Patient not taking: Reported on 01/10/2020)  . quinapril-hydrochlorothiazide (ACCURETIC) 20-25 MG tablet Take 1 tablet by mouth daily.  . simvastatin (ZOCOR) 10 MG tablet Take 1 tablet (10 mg total) by mouth daily.  . sitaGLIPtin (JANUVIA) 100 MG tablet Take 1 tablet (100 mg total) by mouth daily.  Marland Kitchen triamcinolone cream (KENALOG) 0.1 % Apply 1 application topically 2 (two) times daily.   No facility-administered medications prior to visit.    Review of Systems  Constitutional: Positive for fatigue.  HENT: Positive for postnasal drip and sinus pressure.   Eyes: Positive for itching.  Respiratory: Negative.   Cardiovascular: Negative.   Gastrointestinal: Negative.   Endocrine: Positive for polydipsia and polyuria.  Genitourinary: Positive for enuresis.  Musculoskeletal: Negative.   Skin: Negative.   Allergic/Immunologic: Positive for environmental allergies.  Neurological: Positive for facial asymmetry.  Hematological: Negative.   Psychiatric/Behavioral: Positive for agitation and decreased concentration.    Last CBC Lab Results  Component Value Date   WBC 6.7 05/21/2020   HGB 16.2 (H) 05/21/2020    HCT 47.6 (H) 05/21/2020   MCV 93 05/21/2020   MCH 31.6 05/21/2020   RDW 12.4 05/21/2020   PLT 167 32/54/9826   Last metabolic panel Lab Results  Component Value Date   GLUCOSE 300 (H) 05/21/2020   NA 139 05/21/2020   K 3.9 05/21/2020   CL 99 05/21/2020   CO2 26 05/21/2020   BUN 14 05/21/2020   CREATININE 0.79 05/21/2020   GFRNONAA 76 05/21/2020   GFRAA 87 05/21/2020   CALCIUM 9.6 05/21/2020   PROT 6.7 05/21/2020   ALBUMIN 4.4 05/21/2020   LABGLOB 2.3 05/21/2020   AGRATIO 1.9 05/21/2020   BILITOT 0.5 05/21/2020  ALKPHOS 70 05/21/2020   AST 19 05/21/2020   ALT 31 05/21/2020      Objective    BP (!) 143/87 (BP Location: Left Arm, Patient Position: Sitting, Cuff Size: Large)   Pulse 79   Temp 98.2 F (36.8 C) (Oral)   Resp 16   Ht 5' (1.524 m)   Wt 189 lb 9.6 oz (86 kg)   LMP  (LMP Unknown)   BMI 37.03 kg/m  BP Readings from Last 3 Encounters:  05/21/20 (!) 143/87  06/26/19 130/84  04/24/19 (!) 144/84   Wt Readings from Last 3 Encounters:  05/21/20 189 lb 9.6 oz (86 kg)  01/10/20 190 lb (86.2 kg)  06/26/19 185 lb (83.9 kg)      Physical Exam Vitals reviewed.  Constitutional:      General: She is not in acute distress.    Appearance: Normal appearance. She is well-developed. She is obese. She is not ill-appearing or diaphoretic.  HENT:     Head: Normocephalic and atraumatic.     Right Ear: Tympanic membrane, ear canal and external ear normal.     Left Ear: Tympanic membrane, ear canal and external ear normal.     Nose: Nose normal.     Mouth/Throat:     Mouth: Mucous membranes are moist.     Pharynx: Oropharynx is clear. No oropharyngeal exudate or posterior oropharyngeal erythema.  Eyes:     General: No scleral icterus.       Right eye: No discharge.        Left eye: No discharge.     Extraocular Movements: Extraocular movements intact.     Conjunctiva/sclera: Conjunctivae normal.     Pupils: Pupils are equal, round, and reactive to light.  Neck:       Thyroid: No thyromegaly.     Vascular: No carotid bruit or JVD.     Trachea: No tracheal deviation.  Cardiovascular:     Rate and Rhythm: Normal rate and regular rhythm.     Pulses: Normal pulses.     Heart sounds: Normal heart sounds. No murmur heard.  No friction rub. No gallop.   Pulmonary:     Effort: Pulmonary effort is normal. No respiratory distress.     Breath sounds: Normal breath sounds. No wheezing or rales.  Chest:     Chest wall: No tenderness.  Abdominal:     General: Abdomen is flat. Bowel sounds are normal. There is no distension.     Palpations: Abdomen is soft. There is no mass.     Tenderness: There is no abdominal tenderness. There is no guarding or rebound.  Musculoskeletal:        General: No tenderness. Normal range of motion.     Cervical back: Normal range of motion and neck supple.     Right lower leg: No edema.     Left lower leg: No edema.  Lymphadenopathy:     Cervical: No cervical adenopathy.  Skin:    General: Skin is warm and dry.     Capillary Refill: Capillary refill takes less than 2 seconds.     Findings: No rash.  Neurological:     General: No focal deficit present.     Mental Status: She is alert and oriented to person, place, and time. Mental status is at baseline.  Psychiatric:        Mood and Affect: Mood normal.        Behavior: Behavior normal.  Thought Content: Thought content normal.        Judgment: Judgment normal.      Last depression screening scores PHQ 2/9 Scores 05/21/2020 04/24/2019 03/08/2018  PHQ - 2 Score 3 2 0  PHQ- 9 Score 11 8 1    Last fall risk screening Fall Risk  04/24/2019  Falls in the past year? 0  Number falls in past yr: 0  Injury with Fall? 0   Last Audit-C alcohol use screening Alcohol Use Disorder Test (AUDIT) 05/21/2020  1. How often do you have a drink containing alcohol? 1  2. How many drinks containing alcohol do you have on a typical day when you are drinking? 1  3. How often do you  have six or more drinks on one occasion? 1  AUDIT-C Score 3  4. How often during the last year have you found that you were not able to stop drinking once you had started? -  5. How often during the last year have you failed to do what was normally expected from you because of drinking? -  6. How often during the last year have you needed a first drink in the morning to get yourself going after a heavy drinking session? -  7. How often during the last year have you had a feeling of guilt of remorse after drinking? -  8. How often during the last year have you been unable to remember what happened the night before because you had been drinking? -  9. Have you or someone else been injured as a result of your drinking? -  10. Has a relative or friend or a doctor or another health worker been concerned about your drinking or suggested you cut down? -  Alcohol Use Disorder Identification Test Final Score (AUDIT) -  Alcohol Brief Interventions/Follow-up AUDIT Score <7 follow-up not indicated   A score of 3 or more in women, and 4 or more in men indicates increased risk for alcohol abuse, EXCEPT if all of the points are from question 1   No results found for any visits on 05/21/20.  Assessment & Plan    Routine Health Maintenance and Physical Exam  Exercise Activities and Dietary recommendations Goals   None     Immunization History  Administered Date(s) Administered  . Fluad Quad(high Dose 65+) 04/24/2019  . Pneumococcal Conjugate-13 06/20/2014  . Pneumococcal Polysaccharide-23 11/18/2015  . Tdap 01/28/2012  . Zoster 01/28/2012  . Zoster Recombinat (Shingrix) 04/24/2019, 06/26/2019    Health Maintenance  Topic Date Due  . FOOT EXAM  Never done  . COVID-19 Vaccine (1) Never done  . MAMMOGRAM  04/13/2019  . HEMOGLOBIN A1C  10/22/2019  . INFLUENZA VACCINE  03/02/2020  . OPHTHALMOLOGY EXAM  09/04/2020  . COLONOSCOPY  09/28/2020  . TETANUS/TDAP  01/27/2022  . DEXA SCAN  Completed    . Hepatitis C Screening  Completed  . PNA vac Low Risk Adult  Completed    Discussed health benefits of physical activity, and encouraged her to engage in regular exercise appropriate for her age and condition.  1. Annual physical exam Normal physical exam today. Will check labs as below and f/u pending lab results. If labs are stable and WNL she will not need to have these rechecked for one year at her next annual physical exam. She is to call the office in the meantime if she has any acute issue, questions or concerns.  2. Encounter for breast cancer screening using non-mammogram modality Breast exam  today was normal. There is no family history of breast cancer. She does perform regular self breast exams. Mammogram was ordered as below. Information for [facility] was given to patient so she may schedule her mammogram at her convenience. - MM 3D SCREEN BREAST BILATERAL; Future  3. Essential (primary) hypertension Borderline elevated today. Continue Amlodipine 5m, quinapril-hctz 20-229m Will check labs as below and f/u pending results. - CBC with Differential/Platelet - Comprehensive metabolic panel - Hemoglobin A1c - Lipid panel  4. Diabetes mellitus without complication ([place] been elevated in the past and patient admits to having elevated sugars recently. Continue Farxiga 1014mnd Januvia 100m73mn statin and ACE inhibitor. Will check labs as below and f/u pending results. F/U in 3 months.  - CBC with Differential/Platelet - Comprehensive metabolic panel - Hemoglobin A1c - Lipid panel  5. Hypothyroidism due to acquired atrophy of thyroid Had been stable on levothyroxine 25mc33mill check labs as below and f/u pending results. - TSH - levothyroxine (SYNTHROID) 50 MCG tablet; Take 1 tablet (50 mcg total) by mouth daily.  Dispense: 90 tablet; Refill: 3  6. Avitaminosis D H/O this and postmenopausal. Will check labs as below and f/u pending results. - Vitamin D (25  hydroxy)  7. Pure hypercholesterolemia Stable on Simvastatin 10mg.74ml check labs as below and f/u pending results. - CBC with Differential/Platelet - Comprehensive metabolic panel - Hemoglobin A1c - Lipid panel  8. Metabolic syndrome Will check labs as below and f/u pending results. - CBC with Differential/Platelet - Comprehensive metabolic panel - Hemoglobin A1c - Lipid panel  9. Class 2 severe obesity due to excess calories with serious comorbidity and body mass index (BMI) of 37.0 to 37.9 in adult (HCC) [facility] including dieting and exercise.  Will check labs as below and f/u pending results. - CBC with Differential/Platelet - Comprehensive metabolic panel - Hemoglobin A1c - Lipid panel  10. Need for influenza vaccination Flu vaccine given today without complication. Patient sat upright for 15 minutes to check for adverse reaction before being released. - Flu Vaccine QUAD High Dose(Fluad)'  No follow-ups on file.     I, JenReynolds Bowl, have reviewed all documentation for this visit. The documentation on 05/22/20 for the exam, diagnosis, procedures, and orders are all accurate and complete.   JennifRubye BeachinThe [phone]) [phone]  [facility] [place]

## 2020-05-21 ENCOUNTER — Encounter: Payer: Self-pay | Admitting: Physician Assistant

## 2020-05-21 ENCOUNTER — Other Ambulatory Visit: Payer: Self-pay

## 2020-05-21 ENCOUNTER — Ambulatory Visit (INDEPENDENT_AMBULATORY_CARE_PROVIDER_SITE_OTHER): Payer: 59 | Admitting: Physician Assistant

## 2020-05-21 VITALS — BP 143/87 | HR 79 | Temp 98.2°F | Resp 16 | Ht 60.0 in | Wt 189.6 lb

## 2020-05-21 DIAGNOSIS — E559 Vitamin D deficiency, unspecified: Secondary | ICD-10-CM

## 2020-05-21 DIAGNOSIS — Z1239 Encounter for other screening for malignant neoplasm of breast: Secondary | ICD-10-CM | POA: Diagnosis not present

## 2020-05-21 DIAGNOSIS — Z23 Encounter for immunization: Secondary | ICD-10-CM

## 2020-05-21 DIAGNOSIS — E7800 Pure hypercholesterolemia, unspecified: Secondary | ICD-10-CM

## 2020-05-21 DIAGNOSIS — E039 Hypothyroidism, unspecified: Secondary | ICD-10-CM

## 2020-05-21 DIAGNOSIS — I10 Essential (primary) hypertension: Secondary | ICD-10-CM

## 2020-05-21 DIAGNOSIS — Z0000 Encounter for general adult medical examination without abnormal findings: Secondary | ICD-10-CM

## 2020-05-21 DIAGNOSIS — Z6837 Body mass index (BMI) 37.0-37.9, adult: Secondary | ICD-10-CM

## 2020-05-21 DIAGNOSIS — E119 Type 2 diabetes mellitus without complications: Secondary | ICD-10-CM

## 2020-05-21 DIAGNOSIS — E034 Atrophy of thyroid (acquired): Secondary | ICD-10-CM

## 2020-05-21 DIAGNOSIS — E8881 Metabolic syndrome: Secondary | ICD-10-CM

## 2020-05-21 NOTE — Patient Instructions (Signed)
[facility] [address] [place],  [place]  [address] Main: [phone]   Health Maintenance After Age 71 After age 71, you are at a higher risk for certain long-term diseases and infections as well as injuries from falls. Falls are a major cause of broken bones and head injuries in people who are older than age 71. Getting regular preventive care can help to keep you healthy and well. Preventive care includes getting regular testing and making lifestyle changes as recommended by your health care provider. Talk with your health care provider about:  Which screenings and tests you should have. A screening is a test that checks for a disease when you have no symptoms.  A diet and exercise plan that is right for you. What should I know about screenings and tests to prevent falls? Screening and testing are the best ways to find a health problem early. Early diagnosis and treatment give you the best chance of managing medical conditions that are common after age 71. Certain conditions and lifestyle choices may make you more likely to have a fall. Your health care provider may recommend:  Regular vision checks. Poor vision and conditions such as cataracts can make you more likely to have a fall. If you wear glasses, make sure to get your prescription updated if your vision changes.  Medicine review. Work with your health care provider to regularly review all of the medicines you are taking, including over-the-counter medicines. Ask your health care provider about any side effects that may make you more likely to have a fall. Tell your health care provider if any medicines that you take make you feel dizzy or sleepy.  Osteoporosis screening. Osteoporosis is a condition that causes the bones to get weaker. This can make the bones weak and cause them to break more easily.  Blood pressure screening. Blood pressure changes and medicines to control blood pressure can  make you feel dizzy.  Strength and balance checks. Your health care provider may recommend certain tests to check your strength and balance while standing, walking, or changing positions.  Foot health exam. Foot pain and numbness, as well as not wearing proper footwear, can make you more likely to have a fall.  Depression screening. You may be more likely to have a fall if you have a fear of falling, feel emotionally low, or feel unable to do activities that you used to do.  Alcohol use screening. Using too much alcohol can affect your balance and may make you more likely to have a fall. What actions can I take to lower my risk of falls? General instructions  Talk with your health care provider about your risks for falling. Tell your health care provider if: ? You fall. Be sure to tell your health care provider about all falls, even ones that seem minor. ? You feel dizzy, sleepy, or off-balance.  Take over-the-counter and prescription medicines only as told by your health care provider. These include any supplements.  Eat a healthy diet and maintain a healthy weight. A healthy diet includes low-fat dairy products, low-fat (lean) meats, and fiber from whole grains, beans, and lots of fruits and vegetables. Home safety  Remove any tripping hazards, such as rugs, cords, and clutter.  Install safety equipment such as grab bars in bathrooms and safety rails on stairs.  Keep rooms and walkways well-lit. Activity   Follow a regular exercise program to stay fit. This will help you maintain your balance.   Ask your health care provider what types of exercise are appropriate for you.  If you need a cane or walker, use it as recommended by your health care provider.  Wear supportive shoes that have nonskid soles. Lifestyle  Do not drink alcohol if your health care provider tells you not to drink.  If you drink alcohol, limit how much you have: ? 0-1 drink a day for women. ? 0-2 drinks a  day for men.  Be aware of how much alcohol is in your drink. In the [place] one drink equals one typical bottle of beer (12 oz), one-half glass of wine (5 oz), or one shot of hard liquor (1 oz).  Do not use any products that contain nicotine or tobacco, such as cigarettes and e-cigarettes. If you need help quitting, ask your health care provider. Summary  Having a healthy lifestyle and getting preventive care can help to protect your health and wellness after age 71.  Screening and testing are the best way to find a health problem early and help you avoid having a fall. Early diagnosis and treatment give you the best chance for managing medical conditions that are more common for people who are older than age 71.  Falls are a major cause of broken bones and head injuries in people who are older than age 71. Take precautions to prevent a fall at home.  Work with your health care provider to learn what changes you can make to improve your health and wellness and to prevent falls. This information is not intended to replace advice given to you by your health care provider. Make sure you discuss any questions you have with your health care provider. Document Revised: 11/09/2018 Document Reviewed: 06/01/2017 Elsevier Patient Education  2020 Elsevier Inc.  

## 2020-05-22 ENCOUNTER — Telehealth: Payer: Self-pay

## 2020-05-22 LAB — VITAMIN D 25 HYDROXY (VIT D DEFICIENCY, FRACTURES): Vit D, 25-Hydroxy: 21.9 ng/mL — ABNORMAL LOW (ref 30.0–100.0)

## 2020-05-22 LAB — LIPID PANEL
Chol/HDL Ratio: 4.6 ratio — ABNORMAL HIGH (ref 0.0–4.4)
Cholesterol, Total: 209 mg/dL — ABNORMAL HIGH (ref 100–199)
HDL: 45 mg/dL (ref >39)
LDL Chol Calc (NIH): 114 mg/dL — ABNORMAL HIGH (ref 0–99)
Triglycerides: 288 mg/dL — ABNORMAL HIGH (ref 0–149)
VLDL Cholesterol Cal: 50 mg/dL — ABNORMAL HIGH (ref 5–40)

## 2020-05-22 LAB — CBC WITH DIFFERENTIAL/PLATELET
Basophils Absolute: 0 10*3/uL (ref 0.0–0.2)
Basos: 1 %
EOS (ABSOLUTE): 0.2 10*3/uL (ref 0.0–0.4)
Eos: 3 %
Hematocrit: 47.6 % — ABNORMAL HIGH (ref 34.0–46.6)
Hemoglobin: 16.2 g/dL — ABNORMAL HIGH (ref 11.1–15.9)
Immature Grans (Abs): 0 10*3/uL (ref 0.0–0.1)
Immature Granulocytes: 0 %
Lymphocytes Absolute: 2 10*3/uL (ref 0.7–3.1)
Lymphs: 30 %
MCH: 31.6 pg (ref 26.6–33.0)
MCHC: 34 g/dL (ref 31.5–35.7)
MCV: 93 fL (ref 79–97)
Monocytes Absolute: 0.6 10*3/uL (ref 0.1–0.9)
Monocytes: 9 %
Neutrophils Absolute: 3.8 10*3/uL (ref 1.4–7.0)
Neutrophils: 57 %
Platelets: 167 10*3/uL (ref 150–450)
RBC: 5.13 x10E6/uL (ref 3.77–5.28)
RDW: 12.4 % (ref 11.7–15.4)
WBC: 6.7 10*3/uL (ref 3.4–10.8)

## 2020-05-22 LAB — COMPREHENSIVE METABOLIC PANEL
ALT: 31 IU/L (ref 0–32)
AST: 19 IU/L (ref 0–40)
Albumin/Globulin Ratio: 1.9 (ref 1.2–2.2)
Albumin: 4.4 g/dL (ref 3.7–4.7)
Alkaline Phosphatase: 70 IU/L (ref 44–121)
BUN/Creatinine Ratio: 18 (ref 12–28)
BUN: 14 mg/dL (ref 8–27)
Bilirubin Total: 0.5 mg/dL (ref 0.0–1.2)
CO2: 26 mmol/L (ref 20–29)
Calcium: 9.6 mg/dL (ref 8.7–10.3)
Chloride: 99 mmol/L (ref 96–106)
Creatinine, Ser: 0.79 mg/dL (ref 0.57–1.00)
GFR calc Af Amer: 87 mL/min/{1.73_m2} (ref >59)
GFR calc non Af Amer: 76 mL/min/{1.73_m2} (ref >59)
Globulin, Total: 2.3 g/dL (ref 1.5–4.5)
Glucose: 300 mg/dL — ABNORMAL HIGH (ref 65–99)
Potassium: 3.9 mmol/L (ref 3.5–5.2)
Sodium: 139 mmol/L (ref 134–144)
Total Protein: 6.7 g/dL (ref 6.0–8.5)

## 2020-05-22 LAB — TSH: TSH: 4.85 u[IU]/mL — ABNORMAL HIGH (ref 0.450–4.500)

## 2020-05-22 LAB — HEMOGLOBIN A1C
Est. average glucose Bld gHb Est-mCnc: 278 mg/dL
Hgb A1c MFr Bld: 11.3 % — ABNORMAL HIGH (ref 4.8–5.6)

## 2020-05-22 MED ORDER — LEVOTHYROXINE SODIUM 50 MCG PO TABS: 50.0000 ug | ORAL_TABLET | Freq: Every day | ORAL | 3 refills | Status: DC

## 2020-05-22 MED ORDER — TRULICITY 0.75 MG/0.5ML ~~LOC~~ SOAJ: 0.7500 mg | SUBCUTANEOUS | 0 refills | Status: DC

## 2020-05-22 NOTE — Telephone Encounter (Signed)
-----   Message from Margaretann Loveless, [place] sent at 05/22/2020 12:12 PM EDT ----- Blood count is normal. Kidney and liver function are normal. Sodium, potassium, and calcium are normal. A1c is greatly uncontrolled. A1c has increased from 9.2 to now 11.3. Would recommend to continue [place]. Would add Trulicity. This is a once weekly injection that is not insulin that offers great glucose control, but also has cardiac benefits and weight loss. I will send this in. Cholesterol has improved as well. Could increase simvastatin from 10mg  to 20mg  for even better control. If agreeable I can change dose. Thyroid is also slightly underactive. Will increase levothyroxine from to . Vit D is also borderline low. Recommend OTC Vit D supplement 2000 IU daily.

## 2020-05-22 NOTE — Telephone Encounter (Signed)
Written by Margaretann Loveless, PA-C on 05/22/2020 12:12 PM EDT View Full Comments Seen by patient Sol Blazing on 05/22/2020 3:02 PM

## 2020-06-16 ENCOUNTER — Other Ambulatory Visit: Payer: Self-pay | Admitting: Physician Assistant

## 2020-06-16 DIAGNOSIS — E119 Type 2 diabetes mellitus without complications: Secondary | ICD-10-CM

## 2020-07-08 ENCOUNTER — Other Ambulatory Visit: Payer: Self-pay | Admitting: Physician Assistant

## 2020-07-08 DIAGNOSIS — Z1231 Encounter for screening mammogram for malignant neoplasm of breast: Secondary | ICD-10-CM

## 2020-07-19 ENCOUNTER — Other Ambulatory Visit: Payer: Self-pay | Admitting: Physician Assistant

## 2020-07-19 DIAGNOSIS — E119 Type 2 diabetes mellitus without complications: Secondary | ICD-10-CM

## 2020-07-24 ENCOUNTER — Other Ambulatory Visit: Payer: Self-pay | Admitting: Physician Assistant

## 2020-07-24 DIAGNOSIS — J301 Allergic rhinitis due to pollen: Secondary | ICD-10-CM

## 2020-07-24 DIAGNOSIS — E7800 Pure hypercholesterolemia, unspecified: Secondary | ICD-10-CM

## 2020-07-24 DIAGNOSIS — E119 Type 2 diabetes mellitus without complications: Secondary | ICD-10-CM

## 2020-07-28 MED ORDER — TRULICITY 0.75 MG/0.5ML ~~LOC~~ SOAJ: 0.7500 mg | SUBCUTANEOUS | 5 refills | Status: DC

## 2020-07-28 MED ORDER — SIMVASTATIN 10 MG PO TABS: 10.0000 mg | ORAL_TABLET | Freq: Every day | ORAL | 3 refills | Status: DC

## 2020-07-28 MED ORDER — MONTELUKAST SODIUM 10 MG PO TABS: 10.0000 mg | ORAL_TABLET | Freq: Every day | ORAL | 3 refills | Status: DC

## 2020-07-28 NOTE — Addendum Note (Signed)
Addended by: Hyacinth Meeker on: 07/28/2020 10:50 AM   Modules accepted: Orders

## 2020-08-13 ENCOUNTER — Encounter: Payer: Self-pay | Admitting: Physician Assistant

## 2020-08-20 ENCOUNTER — Ambulatory Visit
Admission: RE | Admit: 2020-08-20 | Discharge: 2020-08-20 | Disposition: A | Discharge status: Home / Self Care | Payer: 59 | Source: Ambulatory Visit | Attending: Physician Assistant | Admitting: Physician Assistant

## 2020-08-20 ENCOUNTER — Other Ambulatory Visit: Payer: Self-pay

## 2020-08-20 DIAGNOSIS — Z1231 Encounter for screening mammogram for malignant neoplasm of breast: Secondary | ICD-10-CM | POA: Insufficient documentation

## 2020-09-02 ENCOUNTER — Other Ambulatory Visit: Payer: Self-pay

## 2020-09-02 ENCOUNTER — Telehealth: Payer: Self-pay

## 2020-09-02 ENCOUNTER — Other Ambulatory Visit: Payer: 59

## 2020-09-02 DIAGNOSIS — Z20822 Contact with and (suspected) exposure to covid-19: Secondary | ICD-10-CM

## 2020-09-02 NOTE — Telephone Encounter (Signed)
Copied from CRM [id number]. Topic: General - Other >> Sep 02, 2020 11:33 AM Christine Clarke wrote: Reason for CRM: Pt calling and is requesting to have a covid test done in office. Please advise.

## 2020-09-02 NOTE — Telephone Encounter (Signed)
Apt scheduled for 545 on 09/03/2020   Thanks,   -Vernona Rieger

## 2020-09-03 LAB — SARS-COV-2, NAA 2 DAY TAT

## 2020-09-03 LAB — NOVEL CORONAVIRUS, NAA: SARS-CoV-2, NAA: NOT DETECTED

## 2020-09-22 ENCOUNTER — Encounter: Payer: Self-pay | Admitting: Physician Assistant

## 2020-09-22 DIAGNOSIS — E119 Type 2 diabetes mellitus without complications: Secondary | ICD-10-CM

## 2020-09-22 MED ORDER — CONTOUR NEXT TEST VI STRP: ORAL_STRIP | 12 refills | Status: AC

## 2020-10-08 ENCOUNTER — Other Ambulatory Visit: Payer: Self-pay

## 2020-10-08 ENCOUNTER — Ambulatory Visit
Admission: RE | Admit: 2020-10-08 | Discharge: 2020-10-08 | Disposition: A | Discharge status: Home / Self Care | Payer: 59 | Source: Ambulatory Visit | Attending: Physician Assistant | Admitting: Physician Assistant

## 2020-10-08 ENCOUNTER — Ambulatory Visit: Payer: 59 | Admitting: Physician Assistant

## 2020-10-08 ENCOUNTER — Encounter: Payer: Self-pay | Admitting: Physician Assistant

## 2020-10-08 VITALS — BP 137/80 | HR 83 | Temp 97.7°F | Wt 189.0 lb

## 2020-10-08 DIAGNOSIS — G8929 Other chronic pain: Secondary | ICD-10-CM | POA: Insufficient documentation

## 2020-10-08 DIAGNOSIS — E119 Type 2 diabetes mellitus without complications: Secondary | ICD-10-CM

## 2020-10-08 DIAGNOSIS — E7800 Pure hypercholesterolemia, unspecified: Secondary | ICD-10-CM

## 2020-10-08 DIAGNOSIS — E8881 Metabolic syndrome: Secondary | ICD-10-CM | POA: Diagnosis not present

## 2020-10-08 DIAGNOSIS — E034 Atrophy of thyroid (acquired): Secondary | ICD-10-CM | POA: Diagnosis not present

## 2020-10-08 DIAGNOSIS — R5382 Chronic fatigue, unspecified: Secondary | ICD-10-CM

## 2020-10-08 DIAGNOSIS — M25561 Pain in right knee: Secondary | ICD-10-CM | POA: Diagnosis not present

## 2020-10-08 DIAGNOSIS — R400 Somnolence: Secondary | ICD-10-CM

## 2020-10-08 DIAGNOSIS — E559 Vitamin D deficiency, unspecified: Secondary | ICD-10-CM

## 2020-10-08 DIAGNOSIS — N8110 Cystocele, unspecified: Secondary | ICD-10-CM

## 2020-10-08 DIAGNOSIS — I10 Essential (primary) hypertension: Secondary | ICD-10-CM | POA: Diagnosis not present

## 2020-10-08 DIAGNOSIS — N3941 Urge incontinence: Secondary | ICD-10-CM

## 2020-10-08 NOTE — Progress Notes (Signed)
   Established patient visit   Patient: Christine Clarke   DOB: 09/07/1950   69 y.o. Female  MRN: [id number] Visit Date: 10/08/2020  Today's healthcare provider: Jennifer M Burnette, PA-C   Chief Complaint  Patient presents with  . Knee Pain    Bilateral knee pain right worse than left.  . Fatigue   Subjective    Knee Pain  There was no injury mechanism. The pain is present in the left knee and right knee. Quality: Dull and also sharp pain.  The pain has been worsening since onset. Associated symptoms include muscle weakness. Pertinent negatives include no inability to bear weight, loss of motion, loss of sensation, numbness or tingling. She has tried acetaminophen (Voltaren gel) for the symptoms. The treatment provided mild relief.   HPI    Knee Pain     Additional comments: Bilateral knee pain right worse than left.       Last edited by Walsh, Laura E, CMA on 10/08/2020  2:50 PM. (History)      Fatigue  She reports chronic fatigue which she describes as a lack of energy and feeling sleepy. It began several months ago and occurs every day. It is described as severe and worsening in the last month. Associated symptoms: Yes arthralgias No bleeding  No melena No chest discomfort  No heart palpitations No heart racing   No dyspnea No feeling depressed  No feeling anxious or under stress No fevers  No loss of appetite No nausea  No vomiting No sleeping problems    Wt Readings from Last 3 Encounters:  10/08/20 189 lb (85.7 kg)  05/21/20 189 lb 9.6 oz (86 kg)  01/10/20 190 lb (86.2 kg)    Lab Results  Component Value Date   WBC 7.1 10/08/2020   HGB 16.4 (H) 10/08/2020   HCT 47.8 (H) 10/08/2020   MCV 90 10/08/2020   PLT 186 10/08/2020   Lab Results  Component Value Date   TSH 3.590 10/08/2020   Lab Results  Component Value Date   NA 140 10/08/2020   K 3.8 10/08/2020   CO2 24 10/08/2020   BUN 18 10/08/2020   CREATININE 0.78 10/08/2020   CALCIUM 9.7 10/08/2020    GLUCOSE 188 (H) 10/08/2020     ---------------------------------------------------------------------------------------------------   Patient Active Problem List   Diagnosis Date Noted  . Metabolic syndrome 02/09/2017  . Diabetes mellitus without complication (HCC) 07/18/2015  . Eustachian tube dysfunction 07/18/2015  . Hot flash, menopausal 07/17/2015  . Disease of eyelid 07/17/2015  . Arthritis, degenerative 07/17/2015  . Skin nodule 07/17/2015  . Avitaminosis D 05/07/2015  . HLD (hyperlipidemia) 05/07/2015  . Allergic rhinitis, seasonal 03/19/2009  . Hypothyroidism 03/19/2009  . Low back pain 02/24/2009  . 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 Essential (primary) hypertension 05/18/2000   Past Medical History:  Diagnosis Date  . Allergy   . Diabetes mellitus without complication (HCC)   . Hyperlipidemia   . Hypertension    Social History   Tobacco Use  . Smoking status: Never Smoker  . Smokeless tobacco: Never Used  Vaping Use  . Vaping Use: Never used  Substance Use Topics  . Alcohol use: Yes    Comment: occasional  . Drug use: No   Allergies  Allergen Reactions  . Sulfa Antibiotics Rash    Erythema multiforme  . Metformin And Related Diarrhea     Medications: Outpatient Medications Prior to Visit  Medication Sig  . amLODipine (NORVASC) 5   MG tablet Take 1 tablet (5 mg total) by mouth daily.  . Blood Glucose Monitoring Suppl (CONTOUR NEXT ONE) KIT 1 kit by Does not apply route daily. To check blood sugar daily  . BOTOX 100 units SOLR injection   . dapagliflozin propanediol (FARXIGA) 10 MG TABS tablet Take 1 tablet (10 mg total) by mouth daily before breakfast.  . Dulaglutide (TRULICITY) 0.75 MG/0.5ML SOPN Inject 0.75 mg into the skin once a week.  . escitalopram (LEXAPRO) 10 MG tablet Take 1 tablet (10 mg total) by mouth at bedtime.  . fluticasone (FLONASE) 50 MCG/ACT nasal spray USE 1 SPRAY INTO BOTH  NOSTRILS DAILY  . glucose blood  (CONTOUR NEXT TEST) test strip To check blood sugar daily  . levothyroxine (SYNTHROID) 50 MCG tablet Take 1 tablet (50 mcg total) by mouth daily.  . meloxicam (MOBIC) 15 MG tablet Take 1 tablet (15 mg total) by mouth daily.  . montelukast (SINGULAIR) 10 MG tablet Take 1 tablet (10 mg total) by mouth daily.  . quinapril-hydrochlorothiazide (ACCURETIC) 20-25 MG tablet Take 1 tablet by mouth daily.  . simvastatin (ZOCOR) 10 MG tablet Take 1 tablet (10 mg total) by mouth daily.  . sitaGLIPtin (JANUVIA) 100 MG tablet Take 1 tablet (100 mg total) by mouth daily.  . triamcinolone cream (KENALOG) 0.1 % Apply 1 application topically 2 (two) times daily.  . [DISCONTINUED] Lancets 30G MISC To check blood sugar daily  . [DISCONTINUED] ONETOUCH DELICA LANCETS 33G MISC Check blood sugar before  meals and at bedtime   No facility-administered medications prior to visit.    Review of Systems  Constitutional: Positive for fatigue. Negative for activity change, appetite change, chills, diaphoresis, fever and unexpected weight change.  Respiratory: Positive for cough. Negative for apnea, choking, chest tightness, shortness of breath, wheezing and stridor.   Gastrointestinal: Negative.   Allergic/Immunologic: Positive for environmental allergies.  Neurological: Negative for tingling and numbness.  Psychiatric/Behavioral: Positive for decreased concentration.        Objective    BP 137/80 (BP Location: Left Arm, Patient Position: Sitting, Cuff Size: Large)   Pulse 83   Temp 97.7 F (36.5 C) (Oral)   Wt 189 lb (85.7 kg)   LMP  (LMP Unknown)   BMI 36.91 kg/m    Physical Exam Vitals reviewed.  Constitutional:      General: She is not in acute distress.    Appearance: Normal appearance. She is well-developed and well-groomed. She is obese. She is not ill-appearing or diaphoretic.  Cardiovascular:     Rate and Rhythm: Normal rate and regular rhythm.     Pulses: Normal pulses.     Heart sounds:  Normal heart sounds. No murmur heard. No friction rub. No gallop.   Pulmonary:     Effort: Pulmonary effort is normal. No respiratory distress.     Breath sounds: Normal breath sounds. No wheezing or rales.  Musculoskeletal:     Cervical back: Normal range of motion and neck supple.     Right knee: Swelling and bony tenderness present. Decreased range of motion. Tenderness present over the medial joint line. Normal pulse.     Left knee: No swelling. Normal range of motion. Tenderness present over the medial joint line.     Right lower leg: No edema.     Left lower leg: No edema.  Neurological:     Mental Status: She is alert.  Psychiatric:        Behavior: Behavior is cooperative.        Results for orders placed or performed in visit on 10/08/20  CBC w/Diff/Platelet  Result Value Ref Range   WBC 7.1 3.4 - 10.8 x10E3/uL   RBC 5.29 (H) 3.77 - 5.28 x10E6/uL   Hemoglobin 16.4 (H) 11.1 - 15.9 g/dL   Hematocrit 47.8 (H) 34.0 - 46.6 %   MCV 90 79 - 97 fL   MCH 31.0 26.6 - 33.0 pg   MCHC 34.3 31.5 - 35.7 g/dL   RDW 12.8 11.7 - 15.4 %   Platelets 186 150 - 450 x10E3/uL   Neutrophils 60 Not Estab. %   Lymphs 29 Not Estab. %   Monocytes 8 Not Estab. %   Eos 2 Not Estab. %   Basos 1 Not Estab. %   Neutrophils Absolute 4.3 1.4 - 7.0 x10E3/uL   Lymphocytes Absolute 2.0 0.7 - 3.1 x10E3/uL   Monocytes Absolute 0.5 0.1 - 0.9 x10E3/uL   EOS (ABSOLUTE) 0.1 0.0 - 0.4 x10E3/uL   Basophils Absolute 0.0 0.0 - 0.2 x10E3/uL   Immature Granulocytes 0 Not Estab. %   Immature Grans (Abs) 0.0 0.0 - 0.1 x10E3/uL  Comprehensive Metabolic Panel (CMET)  Result Value Ref Range   Glucose 188 (H) 65 - 99 mg/dL   BUN 18 8 - 27 mg/dL   Creatinine, Ser 0.78 0.57 - 1.00 mg/dL   eGFR 81 >59 mL/min/1.73   BUN/Creatinine Ratio 23 12 - 28   Sodium 140 134 - 144 mmol/L   Potassium 3.8 3.5 - 5.2 mmol/L   Chloride 100 96 - 106 mmol/L   CO2 24 20 - 29 mmol/L   Calcium 9.7 8.7 - 10.3 mg/dL   Total Protein 7.1  6.0 - 8.5 g/dL   Albumin 4.6 3.7 - 4.7 g/dL   Globulin, Total 2.5 1.5 - 4.5 g/dL   Albumin/Globulin Ratio 1.8 1.2 - 2.2   Bilirubin Total 0.5 0.0 - 1.2 mg/dL   Alkaline Phosphatase 65 44 - 121 IU/L   AST 24 0 - 40 IU/L   ALT 34 (H) 0 - 32 IU/L  TSH  Result Value Ref Range   TSH 3.590 0.450 - 4.500 uIU/mL  Lipid Panel With LDL/HDL Ratio  Result Value Ref Range   Cholesterol, Total 200 (H) 100 - 199 mg/dL   Triglycerides 225 (H) 0 - 149 mg/dL   HDL 49 >39 mg/dL   VLDL Cholesterol Cal 39 5 - 40 mg/dL   LDL Chol Calc (NIH) 112 (H) 0 - 99 mg/dL   LDL/HDL Ratio 2.3 0.0 - 3.2 ratio  HgB A1c  Result Value Ref Range   Hgb A1c MFr Bld 8.5 (H) 4.8 - 5.6 %   Est. average glucose Bld gHb Est-mCnc 197 mg/dL  Vitamin D (25 hydroxy)  Result Value Ref Range   Vit D, 25-Hydroxy 21.2 (L) 30.0 - 100.0 ng/mL  B12 and Folate Panel  Result Value Ref Range   Vitamin B-12 973 232 - 1,245 pg/mL   Folate 11.3 >3.0 ng/mL    Assessment & Plan     1. Essential (primary) hypertension BP stable. Will check labs as below and f/u pending results. - CBC w/Diff/Platelet - Comprehensive Metabolic Panel (CMET) - TSH - Lipid Panel With LDL/HDL Ratio  2. Diabetes mellitus without complication (HCC) Continue Trulicity 0.75, Farxiga 10mg, Januvia 100mg. On AceI and statin. Will check labs as below and f/u pending results. - CBC w/Diff/Platelet - Comprehensive Metabolic Panel (CMET) - TSH - Lipid Panel With LDL/HDL Ratio - HgB A1c  3. Hypothyroidism due   to acquired atrophy of thyroid Stable. Continue levothyroxine 44mg. Will check labs as below and f/u pending results. - TSH  4. Metabolic syndrome Will check labs as below and f/u pending results. - CBC w/Diff/Platelet - Comprehensive Metabolic Panel (CMET) - TSH - Lipid Panel With LDL/HDL Ratio - HgB A1c  5. Pure hypercholesterolemia Stable. Continue Simvastatin 124m Will check labs as below and f/u pending results. - CBC w/Diff/Platelet -  Comprehensive Metabolic Panel (CMET) - Lipid Panel With LDL/HDL Ratio - HgB A1c  6. Avitaminosis D H/O this and postmenopausal. Will check labs as below and f/u pending results. - CBC w/Diff/Platelet - Vitamin D (25 hydroxy)  7. Chronic fatigue Will check labs as below and f/u pending results. - CBC w/Diff/Platelet - Comprehensive Metabolic Panel (CMET) - TSH - HgB A1c - Vitamin D (25 hydroxy) - B12 and Folate Panel  8. Daytime somnolence Suspect sleep apnea as cause for daytime somnolence and fatigue. Will check labs as below and f/u pending results. May consider sleep study in the future.  - CBC w/Diff/Platelet - TSH - HgB A1c - B12 and Folate Panel  9. Chronic pain of right knee Suspect OA. Imaging obtained as below. Referral to orthopedics placed.  - DG Knee Complete 4 Views Right; Future  10. Bladder prolapse, female, acquired Having worsening incontinence and pressure. Discussed options from pelvic floor therapy, pessary, medications and surgery. Will refer to urogynecology as below for further evaluation. Consult appreciated.  - Ambulatory referral to Urogynecology  11. Urge incontinence See above medical treatment plan. - Ambulatory referral to Urogynecology   No follow-ups on file.         JeRubye BeachBuSpinetech Surgery [phone]) [phone])  [place]

## 2020-10-08 NOTE — Patient Instructions (Signed)
Journal for Nurse Practitioners, 15(4), 263-267. Retrieved May 08, 2018 from [email or web address].com/[email or web address]">  Knee Exercises Ask your health care provider which exercises are safe for you. Do exercises exactly as told by your health care provider and adjust them as directed. It is normal to feel mild stretching, pulling, tightness, or discomfort as you do these exercises. Stop right away if you feel sudden pain or your pain gets worse. Do not begin these exercises until told by your health care provider. Stretching and range-of-motion exercises These exercises warm up your muscles and joints and improve the movement and flexibility of your knee. These exercises also help to relieve pain and swelling. Knee extension, prone 1. Lie on your abdomen (prone position) on a bed. 2. Place your left / right knee just beyond the edge of the surface so your knee is not on the bed. You can put a towel under your left / right thigh just above your kneecap for comfort. 3. Relax your leg muscles and allow gravity to straighten your knee (extension). You should feel a stretch behind your left / right knee. 4. Hold this position for __________ seconds. 5. Scoot up so your knee is supported between repetitions. Repeat __________ times. Complete this exercise __________ times a day. Knee flexion, active 1. Lie on your back with both legs straight. If this causes back discomfort, bend your left / right knee so your foot is flat on the floor. 2. Slowly slide your left / right heel back toward your buttocks. Stop when you feel a gentle stretch in the front of your knee or thigh (flexion). 3. Hold this position for __________ seconds. 4. Slowly slide your left / right heel back to the starting position. Repeat __________ times. Complete this exercise __________ times a day.   Quadriceps stretch, prone 1. Lie on your abdomen on a firm surface, such as a bed or padded floor. 2. Bend your left / right knee and hold  your ankle. If you cannot reach your ankle or pant leg, loop a belt around your foot and grab the belt instead. 3. Gently pull your heel toward your buttocks. Your knee should not slide out to the side. You should feel a stretch in the front of your thigh and knee (quadriceps). 4. Hold this position for __________ seconds. Repeat __________ times. Complete this exercise __________ times a day.   Hamstring, supine 1. Lie on your back (supine position). 2. Loop a belt or towel over the ball of your left / right foot. The ball of your foot is on the walking surface, right under your toes. 3. Straighten your left / right knee and slowly pull on the belt to raise your leg until you feel a gentle stretch behind your knee (hamstring). ? Do not let your knee bend while you do this. ? Keep your other leg flat on the floor. 4. Hold this position for __________ seconds. Repeat __________ times. Complete this exercise __________ times a day. Strengthening exercises These exercises build strength and endurance in your knee. Endurance is the ability to use your muscles for a long time, even after they get tired. Quadriceps, isometric This exercise stretches the muscles in front of your thigh (quadriceps) without moving your knee joint (isometric). 1. Lie on your back with your left / right leg extended and your other knee bent. Put a rolled towel or small pillow under your knee if told by your health care provider. 2. Slowly tense the muscles in the front of your   left / right thigh. You should see your kneecap slide up toward your hip or see increased dimpling just above the knee. This motion will push the back of the knee toward the floor. 3. For __________ seconds, hold the muscle as tight as you can without increasing your pain. 4. Relax the muscles slowly and completely. Repeat __________ times. Complete this exercise __________ times a day.   Straight leg raises This exercise stretches the muscles in  front of your thigh (quadriceps) and the muscles that move your hips (hip flexors). 1. Lie on your back with your left / right leg extended and your other knee bent. 2. Tense the muscles in the front of your left / right thigh. You should see your kneecap slide up or see increased dimpling just above the knee. Your thigh may even shake a bit. 3. Keep these muscles tight as you raise your leg 4-6 inches (10-15 cm) off the floor. Do not let your knee bend. 4. Hold this position for __________ seconds. 5. Keep these muscles tense as you lower your leg. 6. Relax your muscles slowly and completely after each repetition. Repeat __________ times. Complete this exercise __________ times a day. Hamstring, isometric 1. Lie on your back on a firm surface. 2. Bend your left / right knee about __________ degrees. 3. Dig your left / right heel into the surface as if you are trying to pull it toward your buttocks. Tighten the muscles in the back of your thighs (hamstring) to "dig" as hard as you can without increasing any pain. 4. Hold this position for __________ seconds. 5. Release the tension gradually and allow your muscles to relax completely for __________ seconds after each repetition. Repeat __________ times. Complete this exercise __________ times a day. Hamstring curls If told by your health care provider, do this exercise while wearing ankle weights. Begin with __________ lb weights. Then increase the weight by 1 lb (0.5 kg) increments. Do not wear ankle weights that are more than __________ lb. 1. Lie on your abdomen with your legs straight. 2. Bend your left / right knee as far as you can without feeling pain. Keep your hips flat against the floor. 3. Hold this position for __________ seconds. 4. Slowly lower your leg to the starting position. Repeat __________ times. Complete this exercise __________ times a day.   Squats This exercise strengthens the muscles in front of your thigh and knee  (quadriceps). 1. Stand in front of a table, with your feet and knees pointing straight ahead. You may rest your hands on the table for balance but not for support. 2. Slowly bend your knees and lower your hips like you are going to sit in a chair. ? Keep your weight over your heels, not over your toes. ? Keep your lower legs upright so they are parallel with the table legs. ? Do not let your hips go lower than your knees. ? Do not bend lower than told by your health care provider. ? If your knee pain increases, do not bend as low. 3. Hold the squat position for __________ seconds. 4. Slowly push with your legs to return to standing. Do not use your hands to pull yourself to standing. Repeat __________ times. Complete this exercise __________ times a day. Wall slides This exercise strengthens the muscles in front of your thigh and knee (quadriceps). 1. Lean your back against a smooth wall or door, and walk your feet out 18-24 inches (46-61 cm) from it. 2.   Place your feet hip-width apart. 3. Slowly slide down the wall or door until your knees bend __________ degrees. Keep your knees over your heels, not over your toes. Keep your knees in line with your hips. 4. Hold this position for __________ seconds. Repeat __________ times. Complete this exercise __________ times a day.   Straight leg raises This exercise strengthens the muscles that rotate the leg at the hip and move it away from your body (hip abductors). 1. Lie on your side with your left / right leg in the top position. Lie so your head, shoulder, knee, and hip line up. You may bend your bottom knee to help you keep your balance. 2. Roll your hips slightly forward so your hips are stacked directly over each other and your left / right knee is facing forward. 3. Leading with your heel, lift your top leg 4-6 inches (10-15 cm). You should feel the muscles in your outer hip lifting. ? Do not let your foot drift forward. ? Do not let your  knee roll toward the ceiling. 4. Hold this position for __________ seconds. 5. Slowly return your leg to the starting position. 6. Let your muscles relax completely after each repetition. Repeat __________ times. Complete this exercise __________ times a day.   Straight leg raises This exercise stretches the muscles that move your hips away from the front of the pelvis (hip extensors). 1. Lie on your abdomen on a firm surface. You can put a pillow under your hips if that is more comfortable. 2. Tense the muscles in your buttocks and lift your left / right leg about 4-6 inches (10-15 cm). Keep your knee straight as you lift your leg. 3. Hold this position for __________ seconds. 4. Slowly lower your leg to the starting position. 5. Let your leg relax completely after each repetition. Repeat __________ times. Complete this exercise __________ times a day. This information is not intended to replace advice given to you by your health care provider. Make sure you discuss any questions you have with your health care provider. Document Revised: 05/09/2018 Document Reviewed: 05/09/2018 Elsevier Patient Education  2021 [employer]  

## 2020-10-10 LAB — COMPREHENSIVE METABOLIC PANEL
ALT: 34 IU/L — ABNORMAL HIGH (ref 0–32)
AST: 24 IU/L (ref 0–40)
Albumin/Globulin Ratio: 1.8 (ref 1.2–2.2)
Albumin: 4.6 g/dL (ref 3.7–4.7)
Alkaline Phosphatase: 65 IU/L (ref 44–121)
BUN/Creatinine Ratio: 23 (ref 12–28)
BUN: 18 mg/dL (ref 8–27)
Bilirubin Total: 0.5 mg/dL (ref 0.0–1.2)
CO2: 24 mmol/L (ref 20–29)
Calcium: 9.7 mg/dL (ref 8.7–10.3)
Chloride: 100 mmol/L (ref 96–106)
Creatinine, Ser: 0.78 mg/dL (ref 0.57–1.00)
Globulin, Total: 2.5 g/dL (ref 1.5–4.5)
Glucose: 188 mg/dL — ABNORMAL HIGH (ref 65–99)
Potassium: 3.8 mmol/L (ref 3.5–5.2)
Sodium: 140 mmol/L (ref 134–144)
Total Protein: 7.1 g/dL (ref 6.0–8.5)
eGFR: 81 mL/min/{1.73_m2} (ref >59)

## 2020-10-10 LAB — CBC WITH DIFFERENTIAL/PLATELET
Basophils Absolute: 0 10*3/uL (ref 0.0–0.2)
Basos: 1 %
EOS (ABSOLUTE): 0.1 10*3/uL (ref 0.0–0.4)
Eos: 2 %
Hematocrit: 47.8 % — ABNORMAL HIGH (ref 34.0–46.6)
Hemoglobin: 16.4 g/dL — ABNORMAL HIGH (ref 11.1–15.9)
Immature Grans (Abs): 0 10*3/uL (ref 0.0–0.1)
Immature Granulocytes: 0 %
Lymphocytes Absolute: 2 10*3/uL (ref 0.7–3.1)
Lymphs: 29 %
MCH: 31 pg (ref 26.6–33.0)
MCHC: 34.3 g/dL (ref 31.5–35.7)
MCV: 90 fL (ref 79–97)
Monocytes Absolute: 0.5 10*3/uL (ref 0.1–0.9)
Monocytes: 8 %
Neutrophils Absolute: 4.3 10*3/uL (ref 1.4–7.0)
Neutrophils: 60 %
Platelets: 186 10*3/uL (ref 150–450)
RBC: 5.29 x10E6/uL — ABNORMAL HIGH (ref 3.77–5.28)
RDW: 12.8 % (ref 11.7–15.4)
WBC: 7.1 10*3/uL (ref 3.4–10.8)

## 2020-10-10 LAB — B12 AND FOLATE PANEL
Folate: 11.3 ng/mL (ref >3.0)
Vitamin B-12: 973 pg/mL (ref 232–1245)

## 2020-10-10 LAB — TSH: TSH: 3.59 u[IU]/mL (ref 0.450–4.500)

## 2020-10-10 LAB — LIPID PANEL WITH LDL/HDL RATIO
Cholesterol, Total: 200 mg/dL — ABNORMAL HIGH (ref 100–199)
HDL: 49 mg/dL (ref >39)
LDL Chol Calc (NIH): 112 mg/dL — ABNORMAL HIGH (ref 0–99)
LDL/HDL Ratio: 2.3 ratio (ref 0.0–3.2)
Triglycerides: 225 mg/dL — ABNORMAL HIGH (ref 0–149)
VLDL Cholesterol Cal: 39 mg/dL (ref 5–40)

## 2020-10-10 LAB — HEMOGLOBIN A1C
Est. average glucose Bld gHb Est-mCnc: 197 mg/dL
Hgb A1c MFr Bld: 8.5 % — ABNORMAL HIGH (ref 4.8–5.6)

## 2020-10-10 LAB — VITAMIN D 25 HYDROXY (VIT D DEFICIENCY, FRACTURES): Vit D, 25-Hydroxy: 21.2 ng/mL — ABNORMAL LOW (ref 30.0–100.0)

## 2020-10-16 ENCOUNTER — Encounter: Payer: Self-pay | Admitting: Physician Assistant

## 2020-10-19 ENCOUNTER — Encounter: Payer: Self-pay | Admitting: Physician Assistant

## 2020-10-24 ENCOUNTER — Encounter: Payer: Self-pay | Admitting: Physician Assistant

## 2020-10-24 DIAGNOSIS — J0140 Acute pansinusitis, unspecified: Secondary | ICD-10-CM

## 2020-10-24 MED ORDER — AZITHROMYCIN 250 MG PO TABS
ORAL_TABLET | ORAL | 0 refills | Status: DC
Start: 2020-10-24 — End: 2021-01-20

## 2020-11-03 DIAGNOSIS — M170 Bilateral primary osteoarthritis of knee: Secondary | ICD-10-CM | POA: Insufficient documentation

## 2020-11-04 ENCOUNTER — Encounter: Payer: Self-pay | Admitting: Physician Assistant

## 2020-11-04 DIAGNOSIS — E119 Type 2 diabetes mellitus without complications: Secondary | ICD-10-CM

## 2020-11-04 MED ORDER — TRULICITY 1.5 MG/0.5ML ~~LOC~~ SOAJ
1.5000 mg | SUBCUTANEOUS | 4 refills | Status: DC
Start: 2020-11-04 — End: 2021-01-20

## 2020-12-17 ENCOUNTER — Ambulatory Visit: Payer: 59 | Admitting: Obstetrics and Gynecology

## 2021-01-14 DIAGNOSIS — S43429A Sprain of unspecified rotator cuff capsule, initial encounter: Secondary | ICD-10-CM | POA: Insufficient documentation

## 2021-01-20 ENCOUNTER — Encounter: Payer: Self-pay | Admitting: Family Medicine

## 2021-01-20 ENCOUNTER — Other Ambulatory Visit: Payer: Self-pay

## 2021-01-20 ENCOUNTER — Ambulatory Visit (INDEPENDENT_AMBULATORY_CARE_PROVIDER_SITE_OTHER): Payer: 59 | Admitting: Family Medicine

## 2021-01-20 VITALS — BP 122/70 | HR 98 | Temp 98.9°F | Resp 16

## 2021-01-20 DIAGNOSIS — E034 Atrophy of thyroid (acquired): Secondary | ICD-10-CM | POA: Diagnosis not present

## 2021-01-20 DIAGNOSIS — E1159 Type 2 diabetes mellitus with other circulatory complications: Secondary | ICD-10-CM

## 2021-01-20 DIAGNOSIS — I152 Hypertension secondary to endocrine disorders: Secondary | ICD-10-CM

## 2021-01-20 DIAGNOSIS — E1169 Type 2 diabetes mellitus with other specified complication: Secondary | ICD-10-CM | POA: Diagnosis not present

## 2021-01-20 DIAGNOSIS — M940 Chondrocostal junction syndrome [Tietze]: Secondary | ICD-10-CM | POA: Diagnosis not present

## 2021-01-20 DIAGNOSIS — E785 Hyperlipidemia, unspecified: Secondary | ICD-10-CM

## 2021-01-20 LAB — POCT GLYCOSYLATED HEMOGLOBIN (HGB A1C): Hemoglobin A1C: 9.4 % — AB (ref 4.0–5.6)

## 2021-01-20 MED ORDER — TRULICITY 3 MG/0.5ML ~~LOC~~ SOAJ: 3.0000 mg | SUBCUTANEOUS | 3 refills | Status: DC

## 2021-01-20 NOTE — Assessment & Plan Note (Signed)
No red flags for cardiac chest pain TTP over costochondral junction Discussed NSAIDs, rest, ice

## 2021-01-20 NOTE — Patient Instructions (Addendum)
Stop Januvia Increase trulicity to 3mg  weekly Continue farxiga at current dose

## 2021-01-20 NOTE — Assessment & Plan Note (Signed)
Reviewed last TSH - well controlled Continue synthroid at current dose

## 2021-01-20 NOTE — Progress Notes (Signed)
Established patient visit   Patient: Christine Clarke   DOB: Mar 28, 1951   70 y.o. Female  MRN: [id number] Visit Date: 01/20/2021  Today's healthcare provider: Lavon Paganini, MD   Chief Complaint  Patient presents with   Diabetes   Hypertension   Hypothyroidism   Chest Pain   Subjective    Chest Pain  This is a new problem. The current episode started yesterday. The onset quality is sudden. The problem has been unchanged. The pain is present in the substernal region. Quality: patient denies pain but states that area is sore. The pain does not radiate. Associated symptoms include malaise/fatigue. Pertinent negatives include no abdominal pain, back pain, claudication, cough, diaphoresis, dizziness, exertional chest pressure, fever, headaches, hemoptysis, irregular heartbeat, leg pain, lower extremity edema, nausea, near-syncope, numbness, orthopnea, palpitations, PND, shortness of breath, sputum production, syncope, vomiting or weakness. Associated with: touching chest. She has tried nothing for the symptoms.  Her past medical history is significant for hypertension.  Diabetes Pertinent negatives for hypoglycemia include no dizziness or headaches. Associated symptoms include chest pain. Pertinent negatives for diabetes include no fatigue and no weakness.  Hypertension Associated symptoms include chest pain and malaise/fatigue. Pertinent negatives include no headaches, neck pain, orthopnea, palpitations, PND or shortness of breath.    Chest Discomfort She reports the tenderness hasn't been going on too long. She reports it felt like a bruise. She denies taking any medication to resolve symptoms. She believes it to be associated with the left shoulder and sleeping in uncomfortable positions.   Hypertension, follow-up  BP Readings from Last 3 Encounters:  01/20/21 122/70  10/08/20 137/80  05/21/20 (!) 143/87   Wt Readings from Last 3 Encounters:  10/08/20 189 lb (85.7 kg)   05/21/20 189 lb 9.6 oz (86 kg)  01/10/20 190 lb (86.2 kg)     She was last seen for hypertension 3 months ago.  BP at that visit was 137/80. Management since that visit includes none.  She reports fair compliance with treatment. She is not having side effects. She is following a  poor  diet, patient reports one meal a day and snack. She is not exercising.  She is taking 5 mg amlodipine and  25 mg accuretic and her blood pressure has improved.  Use of agents associated with hypertension: none.   Outside blood pressures are not being checked. Symptoms: No chest pain No chest pressure  No palpitations No syncope  No dyspnea No orthopnea  No paroxysmal nocturnal dyspnea No lower extremity edema   Pertinent labs: Lab Results  Component Value Date   CHOL 200 (H) 10/08/2020   HDL 49 10/08/2020   LDLCALC 112 (H) 10/08/2020   TRIG 225 (H) 10/08/2020   CHOLHDL 4.6 (H) 05/21/2020   Lab Results  Component Value Date   NA 140 10/08/2020   K 3.8 10/08/2020   CREATININE 0.78 10/08/2020   GFRNONAA 76 05/21/2020   GFRAA 87 05/21/2020   GLUCOSE 188 (H) 10/08/2020     The 10-year ASCVD risk score Mikey Bussing DC Jr., et al., 2013) is: 25.9%   ---------------------------------------------------------------------------------------------------  Diabetes Mellitus Type II, Follow-up  Lab Results  Component Value Date   HGBA1C 9.4 (A) 01/20/2021   HGBA1C 8.5 (H) 10/08/2020   HGBA1C 11.3 (H) 05/21/2020   Wt Readings from Last 3 Encounters:  10/08/20 189 lb (85.7 kg)  05/21/20 189 lb 9.6 oz (86 kg)  01/10/20 190 lb (86.2 kg)   Last seen for diabetes 3  months ago.  Management since then includes none continue Trulicity, [place] and stop Januvia. She reports excellent compliance with treatment. She is not having side effects.  Symptoms: Yes fatigue No foot ulcerations  Yes appetite changes No nausea  No paresthesia of the feet  Yes polydipsia  Yes polyuria No visual disturbances   No  vomiting    She reports 1.5 mg trulicity doesn't cause stomach issues.  She denies checking her blood sugar and she reports she is fatigue. She denies managing her diet and she enjoys milkshakes.   Home blood sugar records:  157-290  Episodes of hypoglycemia? No    Current insulin regiment: none Most Recent Eye Exam: 09/05/2019 Current exercise: no regular exercise Current diet habits: in general, an "unhealthy" diet  Pertinent Labs: Lab Results  Component Value Date   CHOL 200 (H) 10/08/2020   HDL 49 10/08/2020   LDLCALC 112 (H) 10/08/2020   TRIG 225 (H) 10/08/2020   CHOLHDL 4.6 (H) 05/21/2020   Lab Results  Component Value Date   NA 140 10/08/2020   K 3.8 10/08/2020   CREATININE 0.78 10/08/2020   GFRNONAA 76 05/21/2020   GFRAA 87 05/21/2020   GLUCOSE 188 (H) 10/08/2020     ---------------------------------------------------------------------------------------------------  Hypothyroid, follow-up  Lab Results  Component Value Date   TSH 3.590 10/08/2020   TSH 4.850 (H) 05/21/2020   TSH 3.570 04/24/2019   T4TOTAL 7.2 02/10/2017   Wt Readings from Last 3 Encounters:  10/08/20 189 lb (85.7 kg)  05/21/20 189 lb 9.6 oz (86 kg)  01/10/20 190 lb (86.2 kg)    She was last seen for hypothyroid 3 months ago.  Management since that visit includes none. She reports fair compliance with treatment. She is not having side effects.   Symptoms: No change in energy level No constipation  No diarrhea No heat / cold intolerance  NoNo nervousness No palpitations   weight changes    -----------------------------------------------------------------------------------------   Past Medical History:  Diagnosis Date   Allergy    Diabetes mellitus without complication ([place])    Hyperlipidemia    Hypertension        Medications: Outpatient Medications Prior to Visit  Medication Sig   amLODipine (NORVASC) 5 MG tablet Take 1 tablet (5 mg total) by mouth daily.   Blood Glucose  Monitoring Suppl (CONTOUR NEXT ONE) KIT 1 kit by Does not apply route daily. To check blood sugar daily   dapagliflozin propanediol (FARXIGA) 10 MG TABS tablet Take 1 tablet (10 mg total) by mouth daily before breakfast.   escitalopram (LEXAPRO) 10 MG tablet Take 1 tablet (10 mg total) by mouth at bedtime.   fluticasone (FLONASE) 50 MCG/ACT nasal spray USE 1 SPRAY INTO BOTH  NOSTRILS DAILY   glucose blood (CONTOUR NEXT TEST) test strip To check blood sugar daily   levothyroxine (SYNTHROID) 50 MCG tablet Take 1 tablet (50 mcg total) by mouth daily.   meloxicam (MOBIC) 15 MG tablet Take 1 tablet (15 mg total) by mouth daily.   montelukast (SINGULAIR) 10 MG tablet Take 1 tablet (10 mg total) by mouth daily.   quinapril-hydrochlorothiazide (ACCURETIC) 20-25 MG tablet Take 1 tablet by mouth daily.   simvastatin (ZOCOR) 10 MG tablet Take 1 tablet (10 mg total) by mouth daily.   triamcinolone cream (KENALOG) 0.1 % Apply 1 application topically 2 (two) times daily.   [DISCONTINUED] Dulaglutide (TRULICITY) 1.5 PP/2.9JJ SOPN Inject 1.5 mg into the skin once a week.   [DISCONTINUED] sitaGLIPtin (JANUVIA) 100 MG  tablet Take 1 tablet (100 mg total) by mouth daily.   BOTOX 100 units SOLR injection  (Patient not taking: Reported on 01/20/2021)   [DISCONTINUED] azithromycin (ZITHROMAX) 250 MG tablet Take 2 tablets PO on day one, and one tablet PO daily thereafter until completed.   No facility-administered medications prior to visit.    Review of Systems  Constitutional:  Positive for malaise/fatigue. Negative for chills, diaphoresis, fatigue and fever.  HENT:  Negative for ear pain, nosebleeds, sinus pain and sore throat.   Eyes:  Negative for pain.  Respiratory:  Negative for apnea, cough, hemoptysis, sputum production, chest tightness, shortness of breath and wheezing.   Cardiovascular:  Positive for chest pain. Negative for palpitations, orthopnea, claudication, leg swelling, syncope, PND and  near-syncope.  Gastrointestinal:  Negative for abdominal pain, blood in stool, constipation, diarrhea, nausea and vomiting.  Genitourinary:  Negative for dysuria, flank pain, frequency, pelvic pain and urgency.  Musculoskeletal:  Negative for back pain, myalgias and neck pain.  Neurological:  Negative for dizziness, syncope, weakness, light-headedness, numbness and headaches.      Objective    BP 122/70   Pulse 98   Temp 98.9 F (37.2 C) (Oral)   Resp 16   LMP  (LMP Unknown)   SpO2 97%     Physical Exam Vitals reviewed.  Constitutional:      General: She is not in acute distress.    Appearance: Normal appearance. She is well-developed. She is not diaphoretic.  HENT:     Head: Normocephalic and atraumatic.  Eyes:     General: No scleral icterus.    Conjunctiva/sclera: Conjunctivae normal.  Neck:     Thyroid: No thyromegaly.  Cardiovascular:     Rate and Rhythm: Normal rate and regular rhythm.     Pulses: Normal pulses.     Heart sounds: Normal heart sounds. No murmur heard. Pulmonary:     Effort: Pulmonary effort is normal. No respiratory distress.     Breath sounds: Normal breath sounds. No wheezing, rhonchi or rales.  Musculoskeletal:     Cervical back: Neck supple.     Right lower leg: No edema.     Left lower leg: No edema.  Lymphadenopathy:     Cervical: No cervical adenopathy.  Skin:    General: Skin is warm and dry.     Findings: No rash.  Neurological:     Mental Status: She is alert and oriented to person, place, and time. Mental status is at baseline.  Psychiatric:        Mood and Affect: Mood normal.        Behavior: Behavior normal.     Results for orders placed or performed in visit on 01/20/21  POCT glycosylated hemoglobin (Hb A1C)  Result Value Ref Range   Hemoglobin A1C 9.4 (A) 4.0 - 5.6 %   HbA1c POC (<> result, manual entry)     HbA1c, POC (prediabetic range)     HbA1c, POC (controlled diabetic range)      Assessment & Plan     Problem  List Items Addressed This Visit       Cardiovascular and Mediastinum   Hypertension associated with diabetes ([place])    Well controlled Continue current medications Reviewed recent metabolic panel       Relevant Medications   Dulaglutide (TRULICITY) 3 FT/7.3UK SOPN     Endocrine   Hypothyroidism    Reviewed last TSH - well controlled Continue synthroid at current dose  T2DM (type 2 diabetes mellitus) ([place]) - Primary    Uncontrolled with hyperglycemia Assoc with HTN And HLD Stop Januvia as she is taking DPP4 and GLP 1 together which has been shown to not have A1c lowering  Increase trulicity to 88m weekly Did not tolerate metformin F/u in 3 months and repeat A1c       Relevant Medications   Dulaglutide (TRULICITY) 3 MHT/9.8VSSOPN   Other Relevant Orders   POCT glycosylated hemoglobin (Hb A1C) (Completed)     Musculoskeletal and Integument   Costochondritis    No red flags for cardiac chest pain TTP over costochondral junction Discussed NSAIDs, rest, ice         Return in about 3 months (around 04/22/2021) for call back in 1 month to schedule with new PCP, chronic disease f/u.      Total time spent on today's visit was greater than 40 minutes, including both face-to-face time and nonface-to-face time personally spent on review of chart (labs and imaging), discussing labs and goals, discussing further work-up, treatment options, referrals to specialist if needed, reviewing outside records of pertinent, answering patient's questions, and coordinating care.   I,Essence Turner,acting as a sEducation administratorfor ALavon Paganini MD.,have documented all relevant documentation on the behalf of ALavon Paganini MD,as directed by  ALavon Paganini MD while in the presence of ALavon Paganini MD.   I, ALavon Paganini MD, have reviewed all documentation for this visit. The documentation on 01/20/21 for the exam, diagnosis, procedures, and orders are all accurate and  complete.   Bladimir Auman, ADionne Bucy MD, MPH [facility]

## 2021-01-20 NOTE — Assessment & Plan Note (Signed)
Uncontrolled with hyperglycemia Assoc with HTN And HLD Stop Januvia as she is taking DPP4 and GLP 1 together which has been shown to not have A1c lowering  Increase trulicity to 3mg  weekly Did not tolerate metformin F/u in 3 months and repeat A1c

## 2021-01-20 NOTE — Assessment & Plan Note (Signed)
Well controlled Continue current medications Reviewed recent metabolic panel 

## 2021-02-24 ENCOUNTER — Other Ambulatory Visit: Payer: Self-pay | Admitting: Physician Assistant

## 2021-02-24 DIAGNOSIS — E119 Type 2 diabetes mellitus without complications: Secondary | ICD-10-CM

## 2021-02-25 ENCOUNTER — Ambulatory Visit: Payer: 59 | Admitting: Obstetrics and Gynecology

## 2021-03-16 ENCOUNTER — Other Ambulatory Visit: Payer: Self-pay

## 2021-03-16 ENCOUNTER — Encounter: Payer: Self-pay | Admitting: Family Medicine

## 2021-03-16 ENCOUNTER — Ambulatory Visit (INDEPENDENT_AMBULATORY_CARE_PROVIDER_SITE_OTHER): Payer: 59 | Admitting: Family Medicine

## 2021-03-16 VITALS — Temp 97.1°F | Wt 184.0 lb

## 2021-03-16 DIAGNOSIS — G4720 Circadian rhythm sleep disorder, unspecified type: Secondary | ICD-10-CM

## 2021-03-16 DIAGNOSIS — J0140 Acute pansinusitis, unspecified: Secondary | ICD-10-CM | POA: Diagnosis not present

## 2021-03-16 DIAGNOSIS — E1169 Type 2 diabetes mellitus with other specified complication: Secondary | ICD-10-CM | POA: Diagnosis not present

## 2021-03-16 MED ORDER — AMOXICILLIN-POT CLAVULANATE 875-125 MG PO TABS: 1.0000 | ORAL_TABLET | Freq: Two times a day (BID) | ORAL | 0 refills | Status: AC

## 2021-03-16 NOTE — Progress Notes (Signed)
MyChart Video Visit    Virtual Visit via Video Note   This visit type was conducted due to national recommendations for restrictions regarding the COVID-19 Pandemic (e.g. social distancing) in an effort to limit this patient's exposure and mitigate transmission in our community. This patient is at least at moderate risk for complications without adequate follow up. This format is felt to be most appropriate for this patient at this time. Physical exam was limited by quality of the video and audio technology used for the visit.    Patient location: home Provider location: [place] involved in the visit: patient, provider   I discussed the limitations of evaluation and management by telemedicine and the availability of in person appointments. The patient expressed understanding and agreed to proceed.  Patient: Christine Clarke   DOB: 1951-04-25   70 y.o. Female  MRN: [id number] Visit Date: 03/16/2021  Today's healthcare provider: Lavon Paganini, MD   Chief Complaint  Patient presents with   URI   Subjective    HPI    URI  The current episode started in the past 7-10 days. The problem has been gradually improving. There has been no fever. Associated symptoms include congestion, coughing, rhinorrhea and sinus pain. Pertinent negatives include no ear pain, headaches, sore throat or wheezing. She has tried nothing for the symptoms.  Home COVID test negative.    Feeling more exhausted lately. Has been an intermittent problem in the past. Then can have trouble falling asleep.  Reports that she went without some diabetes meds for 2wks during vacation.    Medications: Outpatient Medications Prior to Visit  Medication Sig   amLODipine (NORVASC) 5 MG tablet Take 1 tablet (5 mg total) by mouth daily.   Blood Glucose Monitoring Suppl (CONTOUR NEXT ONE) KIT 1 kit by Does not apply route daily. To check blood sugar daily   Dulaglutide (TRULICITY) 3 DH/6.8SH  SOPN Inject 3 mg as directed once a week.   escitalopram (LEXAPRO) 10 MG tablet Take 1 tablet (10 mg total) by mouth at bedtime.   FARXIGA 10 MG TABS tablet TAKE 1 TABLET(10 MG) BY MOUTH DAILY BEFORE BREAKFAST   fluticasone (FLONASE) 50 MCG/ACT nasal spray USE 1 SPRAY INTO BOTH  NOSTRILS DAILY   glucose blood (CONTOUR NEXT TEST) test strip To check blood sugar daily   levothyroxine (SYNTHROID) 50 MCG tablet Take 1 tablet (50 mcg total) by mouth daily.   meloxicam (MOBIC) 15 MG tablet Take 1 tablet (15 mg total) by mouth daily.   montelukast (SINGULAIR) 10 MG tablet Take 1 tablet (10 mg total) by mouth daily.   quinapril-hydrochlorothiazide (ACCURETIC) 20-25 MG tablet Take 1 tablet by mouth daily.   simvastatin (ZOCOR) 10 MG tablet Take 1 tablet (10 mg total) by mouth daily.   triamcinolone cream (KENALOG) 0.1 % Apply 1 application topically 2 (two) times daily.   [DISCONTINUED] BOTOX 100 units SOLR injection  (Patient not taking: No sig reported)   No facility-administered medications prior to visit.    Review of Systems - per HPI    Objective    Temp (!) 97.1 F (36.2 C) (Oral)   Wt 184 lb (83.5 kg)   LMP  (LMP Unknown)   BMI 35.94 kg/m    Physical Exam Constitutional:      General: She is not in acute distress.    Appearance: Normal appearance.  HENT:     Head: Normocephalic.  Pulmonary:     Effort: Pulmonary effort is  normal. No respiratory distress.  Neurological:     Mental Status: She is alert and oriented to person, place, and time. Mental status is at baseline.       Assessment & Plan     1. Acute non-recurrent pansinusitis - symptoms and exam c/w sinusitis   - no evidence of AOM, CAP, strep pharyngitis, or other infection - given duration of symptoms, suspect bacterial etiology - will treat with Augmentin x7d - discussed symptomatic management (flonase, decongestants, etc), natural course, and return precautions    2. Sleep pattern disturbance - recurrent  intermittent problem - discussed sleep hygiene - repeat labs at next visit  3. Type 2 diabetes mellitus with other specified complication, without long-term current use of insulin ([place]) - reports BG is back to normal on home readigns, but did n=miss her medicaiton for a few weeks - repeat A1c at next visit   Return in about 4 weeks (around 04/13/2021) for chronic disease f/u, With new PCP.     I discussed the assessment and treatment plan with the patient. The patient was provided an opportunity to ask questions and all were answered. The patient agreed with the plan and demonstrated an understanding of the instructions.   The patient was advised to call back or seek an in-person evaluation if the symptoms worsen or if the condition fails to improve as anticipated.  I, Lavon Paganini, MD, have reviewed all documentation for this visit. The documentation on 03/16/21 for the exam, diagnosis, procedures, and orders are all accurate and complete.   Jakelin Taussig, Dionne Bucy, MD, MPH [facility]

## 2021-03-22 ENCOUNTER — Encounter: Payer: Self-pay | Admitting: Family Medicine

## 2021-03-23 MED ORDER — AZITHROMYCIN 250 MG PO TABS: ORAL_TABLET | ORAL | 0 refills | Status: AC

## 2021-03-23 NOTE — Telephone Encounter (Signed)
Ok to send in a Z pack

## 2021-04-16 ENCOUNTER — Ambulatory Visit: Payer: 59 | Admitting: Obstetrics and Gynecology

## 2021-05-18 ENCOUNTER — Encounter: Payer: Self-pay | Admitting: Family Medicine

## 2021-05-19 ENCOUNTER — Telehealth: Payer: Self-pay

## 2021-05-19 NOTE — Telephone Encounter (Signed)
Copied from CRM [id number]. Topic: General - Inquiry >> May 18, 2021  3:44 PM Daphine Deutscher D wrote: Reason for CRM: Pt called sating she would feel more comfortable seeing a woman doctor not NP or PA.  She is having an issue right now that Merita Norton has been talking to her about but pt states she would rather talk to a doctor.  [phone]

## 2021-05-19 NOTE — Telephone Encounter (Signed)
We do not currently have a female physician that can take on additional new patients. I am happy to discuss her care with Robynn Pane though. Maybe she has questions?

## 2021-05-19 NOTE — Telephone Encounter (Signed)
Advised as below. Patient reports this is poor customer service. She states she has been a patient of [facility] for about 25 years and should not be considered a new patient for Dr. Beryle Flock. Patient is requesting a number to call customer service.

## 2021-05-19 NOTE — Telephone Encounter (Signed)
Ok so maybe we can just clear this up with the patient and it may just be a communication breakdown.

## 2021-05-22 ENCOUNTER — Ambulatory Visit: Payer: 59 | Admitting: Physician Assistant

## 2021-05-22 ENCOUNTER — Other Ambulatory Visit: Payer: Self-pay

## 2021-05-22 ENCOUNTER — Encounter: Payer: Self-pay | Admitting: Physician Assistant

## 2021-05-22 ENCOUNTER — Telehealth: Payer: Self-pay

## 2021-05-22 VITALS — BP 130/80 | HR 94 | Temp 96.9°F | Resp 16 | Wt 187.3 lb

## 2021-05-22 DIAGNOSIS — R5383 Other fatigue: Secondary | ICD-10-CM

## 2021-05-22 DIAGNOSIS — B3731 Acute candidiasis of vulva and vagina: Secondary | ICD-10-CM | POA: Diagnosis not present

## 2021-05-22 DIAGNOSIS — E039 Hypothyroidism, unspecified: Secondary | ICD-10-CM | POA: Diagnosis not present

## 2021-05-22 DIAGNOSIS — Z23 Encounter for immunization: Secondary | ICD-10-CM

## 2021-05-22 MED ORDER — CLOTRIMAZOLE 1 % EX CREA: 1.0000 "application " | TOPICAL_CREAM | Freq: Two times a day (BID) | CUTANEOUS | 0 refills | Status: DC

## 2021-05-22 MED ORDER — FLUCONAZOLE 150 MG PO TABS: 150.0000 mg | ORAL_TABLET | Freq: Once | ORAL | 1 refills | Status: AC

## 2021-05-22 NOTE — Progress Notes (Signed)
Established patient visit   Patient: Christine Clarke   DOB: 07-03-1951   70 y.o. Female  MRN: [id number] Visit Date: 05/22/2021  Today's healthcare provider: [name], PA-C   Chief Complaint  Patient presents with   Vaginal Itching   Subjective    Vaginal Itching The patient's primary symptoms include genital itching. The patient's pertinent negatives include no genital lesions, genital odor, genital rash, missed menses, pelvic pain, vaginal bleeding or vaginal discharge. This is a new problem. The current episode started 1 to 4 weeks ago. The problem occurs constantly. The problem has been unchanged. The patient is experiencing no pain. She is not pregnant. Pertinent negatives include no abdominal pain, anorexia, back pain, chills, constipation, diarrhea, discolored urine, dysuria, fever, flank pain, frequency, headaches, hematuria, joint pain, joint swelling, nausea, painful intercourse, rash, sore throat, urgency or vomiting. Nothing aggravates the symptoms. She has tried antifungals (Vagisil and Cortizone 10) for the symptoms. The treatment provided no relief. She is not sexually active. She uses nothing for contraception.    She also reports increased fatigue for the last 2-3 months on and off. Denies any changes in sleep, reports no difficulties falling asleep or staying asleep. Reports hair loss and breakage. Denies changes to skin, denies dry skin, no changes to BM, denies constipation.  Denies HA, vision changes, CP, SOB.     Medications: Outpatient Medications Prior to Visit  Medication Sig   amLODipine (NORVASC) 5 MG tablet Take 1 tablet (5 mg total) by mouth daily.   Blood Glucose Monitoring Suppl (CONTOUR NEXT ONE) KIT 1 kit by Does not apply route daily. To check blood sugar daily   Dulaglutide (TRULICITY) 3 TW/4.4QK SOPN Inject 3 mg as directed once a week.   escitalopram (LEXAPRO) 10 MG tablet Take 1 tablet (10 mg total) by mouth at bedtime.   FARXIGA 10 MG  TABS tablet TAKE 1 TABLET(10 MG) BY MOUTH DAILY BEFORE BREAKFAST   fluticasone (FLONASE) 50 MCG/ACT nasal spray USE 1 SPRAY INTO BOTH  NOSTRILS DAILY   glucose blood (CONTOUR NEXT TEST) test strip To check blood sugar daily   levothyroxine (SYNTHROID) 50 MCG tablet Take 1 tablet (50 mcg total) by mouth daily.   meloxicam (MOBIC) 15 MG tablet Take 1 tablet (15 mg total) by mouth daily.   montelukast (SINGULAIR) 10 MG tablet Take 1 tablet (10 mg total) by mouth daily.   quinapril-hydrochlorothiazide (ACCURETIC) 20-25 MG tablet Take 1 tablet by mouth daily.   simvastatin (ZOCOR) 10 MG tablet Take 1 tablet (10 mg total) by mouth daily.   triamcinolone cream (KENALOG) 0.1 % Apply 1 application topically 2 (two) times daily.   No facility-administered medications prior to visit.    Review of Systems  Constitutional:  Negative for chills and fever.  HENT:  Negative for sore throat.   Gastrointestinal:  Negative for abdominal pain, anorexia, constipation, diarrhea, nausea and vomiting.  Genitourinary:  Negative for dysuria, flank pain, frequency, hematuria, missed menses, pelvic pain, urgency and vaginal discharge.  Musculoskeletal:  Negative for back pain and joint pain.  Skin:  Negative for rash.  Neurological:  Negative for headaches.      Objective    BP 130/80   Pulse 94   Temp (!) 96.9 F (36.1 C) (Oral)   Resp 16   Wt 187 lb 4.8 oz (85 kg)   LMP  (LMP Unknown)   SpO2 97%   BMI 36.58 kg/m    Physical Exam Exam conducted with  a chaperone present.  Constitutional:      Appearance: Normal appearance. She is obese.  Pulmonary:     Effort: Pulmonary effort is normal.  Abdominal:     General: There is no distension.     Palpations: Abdomen is soft. There is no mass.     Tenderness: There is no abdominal tenderness.  Genitourinary:    Exam position: Lithotomy position.     Pubic Area: Rash present.     Labia:        Right: Rash present.        Left: Rash present.       Comments: Erythema to vaginal opening and b/l labia. White d/c present. Skin:    General: Skin is warm.  Neurological:     General: No focal deficit present.     Mental Status: She is alert and oriented to person, place, and time.     No results found for any visits on 05/22/21.  Assessment & Plan    Vaginal candida infection Nuswab + to r/o bv, yeast, trich Rx topical clotrimazole for external rash, can use up to twice a day, rx diflucan x 1 dose, if not improved in 3 days can refill and take second dose  2. Fatigue -CBC -CMP -TSH w/ t4, hx of hypothyroidism Pt will schedule f/u to discuss lab results and further evaluate DMII control.   Return in about 2 weeks (around 06/05/2021) for Pt would like to schedule physical, also needs DMII f/u.      I, [name], PA-C have reviewed all documentation for this visit. The documentation on  05/22/2021 for the exam, diagnosis, procedures, and orders are all accurate and complete.    [name], PA-C  [facility] [phone] (phone) [phone] (fax)  [place]

## 2021-05-22 NOTE — Addendum Note (Signed)
Addended by: Fonda Kinder on: 05/22/2021 04:44 PM   Modules accepted: Orders

## 2021-05-22 NOTE — Telephone Encounter (Signed)
Copied from CRM [id number]. Topic: Appointment Scheduling - Scheduling Inquiry for Clinic >> May 22, 2021 10:19 AM Gaetana Michaelis A wrote: Reason for CRM: The patient would like to be seen today 05/22/21  The patient would like to be seen for personal discomfort   Please contact further to advise scheduling  Patient declined to speak with a triage nurse

## 2021-05-23 LAB — COMPREHENSIVE METABOLIC PANEL
ALT: 36 IU/L — ABNORMAL HIGH (ref 0–32)
AST: 22 IU/L (ref 0–40)
Albumin/Globulin Ratio: 2.1 (ref 1.2–2.2)
Albumin: 4.2 g/dL (ref 3.7–4.7)
Alkaline Phosphatase: 62 IU/L (ref 44–121)
BUN/Creatinine Ratio: 20 (ref 12–28)
BUN: 17 mg/dL (ref 8–27)
Bilirubin Total: 0.5 mg/dL (ref 0.0–1.2)
CO2: 26 mmol/L (ref 20–29)
Calcium: 9.2 mg/dL (ref 8.7–10.3)
Chloride: 99 mmol/L (ref 96–106)
Creatinine, Ser: 0.87 mg/dL (ref 0.57–1.00)
Globulin, Total: 2 g/dL (ref 1.5–4.5)
Glucose: 312 mg/dL — ABNORMAL HIGH (ref 70–99)
Potassium: 3.7 mmol/L (ref 3.5–5.2)
Sodium: 139 mmol/L (ref 134–144)
Total Protein: 6.2 g/dL (ref 6.0–8.5)
eGFR: 71 mL/min/{1.73_m2} (ref >59)

## 2021-05-23 LAB — CBC WITH DIFFERENTIAL/PLATELET
Basophils Absolute: 0 10*3/uL (ref 0.0–0.2)
Basos: 1 %
EOS (ABSOLUTE): 0.1 10*3/uL (ref 0.0–0.4)
Eos: 2 %
Hematocrit: 46.4 % (ref 34.0–46.6)
Hemoglobin: 16.3 g/dL — ABNORMAL HIGH (ref 11.1–15.9)
Immature Grans (Abs): 0 10*3/uL (ref 0.0–0.1)
Immature Granulocytes: 0 %
Lymphocytes Absolute: 1.9 10*3/uL (ref 0.7–3.1)
Lymphs: 31 %
MCH: 32 pg (ref 26.6–33.0)
MCHC: 35.1 g/dL (ref 31.5–35.7)
MCV: 91 fL (ref 79–97)
Monocytes Absolute: 0.5 10*3/uL (ref 0.1–0.9)
Monocytes: 8 %
Neutrophils Absolute: 3.7 10*3/uL (ref 1.4–7.0)
Neutrophils: 58 %
Platelets: 174 10*3/uL (ref 150–450)
RBC: 5.1 x10E6/uL (ref 3.77–5.28)
RDW: 12.3 % (ref 11.7–15.4)
WBC: 6.3 10*3/uL (ref 3.4–10.8)

## 2021-05-23 LAB — TSH+FREE T4
Free T4: 0.95 ng/dL (ref 0.82–1.77)
TSH: 3.35 u[IU]/mL (ref 0.450–4.500)

## 2021-05-24 LAB — NUSWAB VAGINITIS PLUS (VG+)
Candida albicans, NAA: POSITIVE — AB
Candida glabrata, NAA: NEGATIVE
Chlamydia trachomatis, NAA: NEGATIVE
Neisseria gonorrhoeae, NAA: NEGATIVE
Trich vag by NAA: NEGATIVE

## 2021-05-25 NOTE — Telephone Encounter (Signed)
Patient seen in office by Alfredia Ferguson. KW

## 2021-05-26 ENCOUNTER — Ambulatory Visit: Payer: Self-pay | Admitting: *Deleted

## 2021-05-26 MED ORDER — FLUCONAZOLE 150 MG PO TABS: ORAL_TABLET | ORAL | 0 refills | Status: DC

## 2021-05-26 NOTE — Telephone Encounter (Signed)
Patient called and would like to talk to pcp or cma about her visit on Friday. She stated that her condition is much better but still itching. Patient feels meds helped some but not completely. Please call patient back, thanks.   Pt reports "A lot better but still itching."   Would like 2nd dose of  Diflucan called in as discussed at OV 05/22/21.        Reason for Disposition  [1] Caller has URGENT medicine question about med that PCP or specialist prescribed AND [2] triager unable to answer question  Answer Assessment - Initial Assessment Questions 1. NAME of MEDICATION: "What medicine are you calling about?"     Diflucan. 2. QUESTION: "What is your question?" (e.g., double dose of medicine, side effect)     refill 3. PRESCRIBING HCP: "Who prescribed it?" Reason: if prescribed by specialist, call should be referred to that group.     *No Answer* 4. SYMPTOMS: "Do you have any symptoms?"  itching 5. SEVERITY: If symptoms are present, ask "Are they mild, moderate or severe?"     Better but still there 6    Not urgent  Protocols used: Medication Question Call-A-AH

## 2021-05-26 NOTE — Addendum Note (Signed)
Addended by: Kavin Leech E on: 05/26/2021 04:31 PM   Modules accepted: Orders

## 2021-06-16 ENCOUNTER — Telehealth: Payer: Self-pay

## 2021-06-16 NOTE — Telephone Encounter (Signed)
Copied from CRM [id number]. Topic: Appointment Scheduling - Scheduling Inquiry for Clinic >> Jun 15, 2021  3:26 PM Pawlus, Maxine Glenn A wrote: Reason for CRM: Pt wanted to schedule her CPE, please advise.

## 2021-06-17 NOTE — Telephone Encounter (Signed)
Pt wants someone to FU about her CPE  scheduled at yesterday's request as noone returned the call and  she now states has a yeast infection and wants a refill on the  fluconazole (DIFLUCAN) 150 MG tablet 2 tablet 0 05/26/2021    Sig: x 1 dose, if not improved in 3 days can take second dose   Sent to pharmacy as: fluconazole (DIFLUCAN) 150 MG tablet   E-Prescribing Status: Receipt confirmed by pharmacy    Pt states that if she is required to come in will also need appt for that. She has been having issues with yeast infections and this pill worked but  thinks yeast infection has come back. Wanted to mention as may need a cb for 2 appt. Pec cannot sch fu at [phone]

## 2021-06-19 ENCOUNTER — Other Ambulatory Visit: Payer: Self-pay

## 2021-06-19 ENCOUNTER — Encounter: Payer: Self-pay | Admitting: Physician Assistant

## 2021-06-19 ENCOUNTER — Ambulatory Visit (INDEPENDENT_AMBULATORY_CARE_PROVIDER_SITE_OTHER): Payer: 59 | Admitting: Physician Assistant

## 2021-06-19 VITALS — BP 132/76 | HR 92 | Temp 98.3°F | Ht 60.0 in | Wt 185.9 lb

## 2021-06-19 DIAGNOSIS — Z0000 Encounter for general adult medical examination without abnormal findings: Secondary | ICD-10-CM

## 2021-06-19 DIAGNOSIS — E11620 Type 2 diabetes mellitus with diabetic dermatitis: Secondary | ICD-10-CM | POA: Diagnosis not present

## 2021-06-19 DIAGNOSIS — E559 Vitamin D deficiency, unspecified: Secondary | ICD-10-CM

## 2021-06-19 DIAGNOSIS — Z1211 Encounter for screening for malignant neoplasm of colon: Secondary | ICD-10-CM

## 2021-06-19 DIAGNOSIS — M545 Low back pain, unspecified: Secondary | ICD-10-CM

## 2021-06-19 DIAGNOSIS — B3731 Acute candidiasis of vulva and vagina: Secondary | ICD-10-CM | POA: Diagnosis not present

## 2021-06-19 DIAGNOSIS — I152 Hypertension secondary to endocrine disorders: Secondary | ICD-10-CM

## 2021-06-19 DIAGNOSIS — Z0183 Encounter for blood typing: Secondary | ICD-10-CM

## 2021-06-19 DIAGNOSIS — R5383 Other fatigue: Secondary | ICD-10-CM

## 2021-06-19 DIAGNOSIS — F320 Major depressive disorder, single episode, mild: Secondary | ICD-10-CM

## 2021-06-19 DIAGNOSIS — R109 Unspecified abdominal pain: Secondary | ICD-10-CM | POA: Diagnosis not present

## 2021-06-19 DIAGNOSIS — E1169 Type 2 diabetes mellitus with other specified complication: Secondary | ICD-10-CM

## 2021-06-19 DIAGNOSIS — E785 Hyperlipidemia, unspecified: Secondary | ICD-10-CM

## 2021-06-19 DIAGNOSIS — E039 Hypothyroidism, unspecified: Secondary | ICD-10-CM

## 2021-06-19 DIAGNOSIS — E1159 Type 2 diabetes mellitus with other circulatory complications: Secondary | ICD-10-CM

## 2021-06-19 DIAGNOSIS — R10A0 Flank pain, unspecified side: Secondary | ICD-10-CM

## 2021-06-19 MED ORDER — NYSTATIN-TRIAMCINOLONE 100000-0.1 UNIT/GM-% EX OINT: 1.0000 "application " | TOPICAL_OINTMENT | Freq: Two times a day (BID) | CUTANEOUS | 1 refills | Status: DC

## 2021-06-19 MED ORDER — ESCITALOPRAM OXALATE 10 MG PO TABS: 10.0000 mg | ORAL_TABLET | Freq: Every day | ORAL | 3 refills | Status: AC

## 2021-06-19 NOTE — Assessment & Plan Note (Addendum)
Minimal improvement with Diflucan and topical clomitrazole.  We will try combination steroid/nystatin cream.  If no improvement from this we will refer her to GYN/dermatology.

## 2021-06-19 NOTE — Assessment & Plan Note (Addendum)
May be local inflammation i.e. costochondritis or a pulled muscle.  Recommended rest, ice/heat, stretching, Tylenol if needed.  If not improved in 2 weeks please call office.

## 2021-06-19 NOTE — Assessment & Plan Note (Signed)
Previously uncontrolled with hyperglycemia.  We will recheck A1c, last CMP was nonfasting glucose elevated over 300.  We discussed that we will need to change her medications as anticipating a high A1c. After blood work is completed we will have a conversation regarding which agent to choose.  She is comfortable with injections would prefer to go this route as she is symptoms not consistent with pills.  We did discuss at length that in the long term weight loss and regular exercise is the most sustainable way to keep A1c low and prevent long term effects of uncontrolled DM. Discussed what a balanced diet entails and which foods to avoid or swap.

## 2021-06-19 NOTE — Assessment & Plan Note (Signed)
Was not taking Lexapro consistently. Will be more conscious to take daily and at a similar time each day.

## 2021-06-19 NOTE — Progress Notes (Signed)
   Complete physical exam   Patient: Christine Clarke   DOB: 11/12/1950   70 y.o. Female  MRN: [id number] Visit Date: 06/19/2021  Today's healthcare provider: Lindsay Drubel, PA-C   Chief Complaint  Patient presents with   Annual Exam   Subjective    Christine Clarke is a 70 y.o. female who presents today for a complete physical exam.  She reports consuming a general diet. The patient does not participate in regular exercise at present. She generally feels fairly well. She reports sleeping well but for too long. She does have additional problems to discuss today.  HPI  She was treated 05/22/2021 for an internal and external vaginal yeast infection.  She took Diflucan 3 times since 10/21 as she had an extra at home.  She is also using the external clobetasol cream with some relief.  She states that initially her symptoms did go away but feels they have returned. Denies dysuria, hematuria.   Patient reports jolting pain in her side for about a week and a half.  She is sometimes able to reproduce with movement but is not consistent.  Reports this pain can move sometimes her right flank sometimes she feels a shooting pain in her rib cage.  Denies any potential exacerbating factors, denies any recent injuries.  She reiterates today that she is very fatigued more than normal.  She will often fall asleep on the couch even after a full night sleep.  Denies any headaches in the morning denies frequent nighttime wakings.  In regards to anxiety/depression she does feel like this is increased to more prevalent in her life than previously, but does not want any changes in her medication for now.  She does not take it consistently does not take it at the same time every day.  She is also curious to know her blood type today. Past Medical History:  Diagnosis Date   Allergy    Diabetes mellitus without complication (HCC)    Hyperlipidemia    Hypertension    Past Surgical History:  Procedure  Laterality Date   ABDOMINAL HYSTERECTOMY  1981   partial   BLADDER SURGERY  1981   BREAST EXCISIONAL BIOPSY Left 1998   Social History   Socioeconomic History   Marital status: Divorced    Spouse name: Not on file   Number of children: Not on file   Years of education: Not on file   Highest education level: Not on file  Occupational History   Not on file  Tobacco Use   Smoking status: Never   Smokeless tobacco: Never  Vaping Use   Vaping Use: Never used  Substance and Sexual Activity   Alcohol use: Yes    Comment: occasional   Drug use: No   Sexual activity: Not on file  Other Topics Concern   Not on file  Social History Narrative   Not on file   Social Determinants of Health   Financial Resource Strain: Not on file  Food Insecurity: Not on file  Transportation Needs: Not on file  Physical Activity: Not on file  Stress: Not on file  Social Connections: Not on file  Intimate Partner Violence: Not on file   Family Status  Relation Name Status   Mother  Deceased at age 40's       stroke   Father  Deceased at age 60's       pneumonia   Neg Hx  (Not Specified)   Family History    Problem Relation Age of Onset   Hypertension Mother    Breast cancer Neg Hx    Allergies  Allergen Reactions   Sulfa Antibiotics Rash    Erythema multiforme   Metformin And Related Diarrhea    Patient Care Team: Drubel, Lindsay, PA-C as PCP - General (Physician Assistant)   Medications: Outpatient Medications Prior to Visit  Medication Sig   amLODipine (NORVASC) 5 MG tablet Take 1 tablet (5 mg total) by mouth daily.   Blood Glucose Monitoring Suppl (CONTOUR NEXT ONE) KIT 1 kit by Does not apply route daily. To check blood sugar daily   clotrimazole ([employer] CLOTRIMAZOLE) 1 % cream Apply 1 application topically 2 (two) times daily.   Dulaglutide (TRULICITY) 3 MG/0.5ML SOPN Inject 3 mg as directed once a week.   FARXIGA 10 MG TABS tablet TAKE 1 TABLET(10 MG) BY MOUTH DAILY BEFORE  BREAKFAST   fluconazole (DIFLUCAN) 150 MG tablet x 1 dose, if not improved in 3 days can take second dose   fluticasone (FLONASE) 50 MCG/ACT nasal spray USE 1 SPRAY INTO BOTH  NOSTRILS DAILY   glucose blood (CONTOUR NEXT TEST) test strip To check blood sugar daily   levothyroxine (SYNTHROID) 50 MCG tablet Take 1 tablet (50 mcg total) by mouth daily.   meloxicam (MOBIC) 15 MG tablet Take 1 tablet (15 mg total) by mouth daily.   montelukast (SINGULAIR) 10 MG tablet Take 1 tablet (10 mg total) by mouth daily.   quinapril-hydrochlorothiazide (ACCURETIC) 20-25 MG tablet Take 1 tablet by mouth daily.   simvastatin (ZOCOR) 10 MG tablet Take 1 tablet (10 mg total) by mouth daily.   triamcinolone cream (KENALOG) 0.1 % Apply 1 application topically 2 (two) times daily.   [DISCONTINUED] escitalopram (LEXAPRO) 10 MG tablet Take 1 tablet (10 mg total) by mouth at bedtime.   No facility-administered medications prior to visit.    Review of Systems  Constitutional:  Positive for fatigue.  HENT:  Positive for sinus pressure.   Eyes: Negative.   Respiratory:  Positive for cough.   Cardiovascular: Negative.   Gastrointestinal: Negative.   Endocrine: Positive for polyuria.  Genitourinary:  Positive for vaginal pain.  Musculoskeletal: Negative.   Skin: Negative.   Allergic/Immunologic: Negative.   Neurological: Negative.   Hematological: Negative.   Psychiatric/Behavioral:  Positive for sleep disturbance. The patient is nervous/anxious.   All other systems reviewed and are negative.  Last CBC Lab Results  Component Value Date   WBC 6.3 05/22/2021   HGB 16.3 (H) 05/22/2021   HCT 46.4 05/22/2021   MCV 91 05/22/2021   MCH 32.0 05/22/2021   RDW 12.3 05/22/2021   PLT 174 05/22/2021   Last metabolic panel Lab Results  Component Value Date   GLUCOSE 312 (H) 05/22/2021   NA 139 05/22/2021   K 3.7 05/22/2021   CL 99 05/22/2021   CO2 26 05/22/2021   BUN 17 05/22/2021   CREATININE 0.87  05/22/2021   EGFR 71 05/22/2021   CALCIUM 9.2 05/22/2021   PROT 6.2 05/22/2021   ALBUMIN 4.2 05/22/2021   LABGLOB 2.0 05/22/2021   AGRATIO 2.1 05/22/2021   BILITOT 0.5 05/22/2021   ALKPHOS 62 05/22/2021   AST 22 05/22/2021   ALT 36 (H) 05/22/2021   Last lipids Lab Results  Component Value Date   CHOL 200 (H) 10/08/2020   HDL 49 10/08/2020   LDLCALC 112 (H) 10/08/2020   TRIG 225 (H) 10/08/2020   CHOLHDL 4.6 (H) 05/21/2020   Last hemoglobin A1c Lab   Results  Component Value Date   HGBA1C 9.4 (A) 01/20/2021   Last thyroid functions Lab Results  Component Value Date   TSH 3.350 05/22/2021   T4TOTAL 7.2 02/10/2017   Last vitamin D Lab Results  Component Value Date   VD25OH 21.2 (L) 10/08/2020   Last vitamin B12 and Folate Lab Results  Component Value Date   VITAMINB12 973 10/08/2020   FOLATE 11.3 10/08/2020      Objective    BP 132/76 (BP Location: Left Arm, Patient Position: Sitting, Cuff Size: Large)   Pulse 92   Temp 98.3 F (36.8 C) (Oral)   Ht 5' (1.524 m)   Wt 185 lb 14.4 oz (84.3 kg)   LMP  (LMP Unknown)   SpO2 96%   BMI 36.31 kg/m  BP Readings from Last 3 Encounters:  06/19/21 132/76  05/22/21 130/80  01/20/21 122/70   Wt Readings from Last 3 Encounters:  06/19/21 185 lb 14.4 oz (84.3 kg)  05/22/21 187 lb 4.8 oz (85 kg)  03/16/21 184 lb (83.5 kg)      Physical Exam Constitutional:      General: She is awake.     Appearance: She is well-developed.  HENT:     Head: Normocephalic.     Right Ear: Tympanic membrane normal.     Left Ear: Tympanic membrane normal.  Eyes:     Conjunctiva/sclera: Conjunctivae normal.     Pupils: Pupils are equal, round, and reactive to light.  Neck:     Thyroid: No thyroid mass or thyromegaly.  Cardiovascular:     Rate and Rhythm: Normal rate and regular rhythm.     Pulses:          Dorsalis pedis pulses are 3+ on the right side and 3+ on the left side.     Heart sounds: Normal heart sounds.  Pulmonary:      Effort: Pulmonary effort is normal.     Breath sounds: Normal breath sounds.  Abdominal:     Palpations: Abdomen is soft.     Tenderness: There is no abdominal tenderness.  Musculoskeletal:     Right lower leg: No swelling.     Left lower leg: No swelling.  Feet:     Right foot:     Protective Sensation: 3 sites tested.  3 sites sensed.     Skin integrity: Callus and dry skin present.     Toenail Condition: Right toenails are normal.     Left foot:     Protective Sensation: 3 sites tested.  3 sites sensed.     Skin integrity: Callus and dry skin present.     Toenail Condition: Left toenails are normal.  Lymphadenopathy:     Cervical: No cervical adenopathy.  Skin:    General: Skin is warm.  Neurological:     Mental Status: She is alert and oriented to person, place, and time.  Psychiatric:        Attention and Perception: Attention normal.        Mood and Affect: Mood normal.        Speech: Speech normal.        Behavior: Behavior is cooperative.     Last depression screening scores PHQ 2/9 Scores 06/19/2021 10/08/2020 05/21/2020  PHQ - 2 Score _0 PHQ- 9 Score _1 Last fall risk screening Fall Risk  06/19/2021  Falls in the past year? 1  Number falls in past yr: 0  Injury  with Fall? 0  Risk for fall due to : -  Follow up -   Last Audit-C alcohol use screening Alcohol Use Disorder Test (AUDIT) 06/19/2021  1. How often do you have a drink containing alcohol? 2  2. How many drinks containing alcohol do you have on a typical day when you are drinking? 1  3. How often do you have six or more drinks on one occasion? 1  AUDIT-C Score 4  4. How often during the last year have you found that you were not able to stop drinking once you had started? -  5. How often during the last year have you failed to do what was normally expected from you because of drinking? -  6. How often during the last year have you needed a first drink in the morning to get yourself  going after a heavy drinking session? -  7. How often during the last year have you had a feeling of guilt of remorse after drinking? -  8. How often during the last year have you been unable to remember what happened the night before because you had been drinking? -  9. Have you or someone else been injured as a result of your drinking? -  10. Has a relative or friend or a doctor or another health worker been concerned about your drinking or suggested you cut down? -  Alcohol Use Disorder Identification Test Final Score (AUDIT) -  Alcohol Brief Interventions/Follow-up -   A score of 3 or more in women, and 4 or more in men indicates increased risk for alcohol abuse, EXCEPT if all of the points are from question 1   No results found for any visits on 06/19/21.  Assessment & Plan    Routine Health Maintenance and Physical Exam  Immunization History  Administered Date(s) Administered   Fluad Quad(high Dose 65+) 04/24/2019, 05/21/2020, 05/22/2021   Pneumococcal Conjugate-13 06/20/2014   Pneumococcal Polysaccharide-23 11/18/2015   Tdap 01/28/2012   Zoster Recombinat (Shingrix) 04/24/2019, 06/26/2019   Zoster, Live 01/28/2012    Health Maintenance  Topic Date Due   COVID-19 Vaccine (1) Never done   COLONOSCOPY (Pts 45-49yrs Insurance coverage will need to be confirmed)  09/28/2020   OPHTHALMOLOGY EXAM  09/14/2021 (Originally 09/04/2020)   HEMOGLOBIN A1C  07/22/2021   MAMMOGRAM  08/20/2021   TETANUS/TDAP  01/27/2022   FOOT EXAM  06/19/2022   Pneumonia Vaccine 65+ Years old  Completed   INFLUENZA VACCINE  Completed   DEXA SCAN  Completed   Hepatitis C Screening  Completed   Zoster Vaccines- Shingrix  Completed   HPV VACCINES  Aged Out    Discussed health benefits of physical activity, and encouraged her to engage in regular exercise appropriate for her age and condition.  Problem List Items Addressed This Visit       Cardiovascular and Mediastinum   Hypertension associated with  diabetes (HCC)    Chronic and stable on medication.  Continue as prescribed.        Endocrine   Hypothyroidism    TSH and T4 measured 10/22 right within normal limits.  Continue current dosage      T2DM (type 2 diabetes mellitus) (HCC)    Previously uncontrolled with hyperglycemia.  We will recheck A1c, last CMP was nonfasting glucose elevated over 300.  We discussed that we will need to change her medications as anticipating a high A1c. After blood work is completed we will have a conversation regarding which agent to   choose.  She is comfortable with injections would prefer to go this route as she is symptoms not consistent with pills.  We did discuss at length that in the long term weight loss and regular exercise is the most sustainable way to keep A1c low and prevent long term effects of uncontrolled DM. Discussed what a balanced diet entails and which foods to avoid or swap.       Relevant Orders   HgB A1c   Hyperlipidemia associated with type 2 diabetes mellitus (HCC)    Goal LDL under 70.  Currently taking 10 mg of simvastatin.  Previously 3/22 LDL over 100 and elevated triglycerides.  We will recheck lipids today.  No need to repeat CMP, was checked end of October, liver enzymes within normal limits.      Relevant Orders   Lipid Profile     Genitourinary   Yeast infection involving the vagina and surrounding area    Minimal improvement with Diflucan and topical clomitrazole.  We will try combination steroid/nystatin cream.  If no improvement from this we will refer her to GYN/dermatology.      Relevant Medications   nystatin-triamcinolone ointment (MYCOLOG)     Other   Low back pain   Flank pain    May be local inflammation i.e. costochondritis or a pulled muscle.  Recommended rest, ice/heat, stretching, Tylenol if needed.  If not improved in 2 weeks please call office.      Other fatigue    We discussed what sleep apnea is and its potential relationship with feeling  tired during the day despite sleeping.  We discussed home sleep test versus sleep testing facilities.  Patient would prefer a home sleep test if her insurance will cover it.  I recommended this to rule out sleep apnea.      Relevant Orders   Home sleep test   Depression, major, single episode, mild ([place])    Was not taking Lexapro consistently. Will be more conscious to take daily and at a similar time each day.        Relevant Medications   escitalopram (LEXAPRO) 10 MG tablet   Other Visit Diagnoses     Encounter for physical examination    -  Primary   Morbid obesity ([place])       Colon cancer screening       Relevant Orders   Cologuard   Vitamin D deficiency       Relevant Orders   Vitamin D (25 hydroxy)   Encounter for blood typing       Relevant Orders   ABO/Rh   Grief reaction       Relevant Medications   escitalopram (LEXAPRO) 10 MG tablet        Return in about 3 months (around 09/19/2021) for DMII, anxiety.     I, [name], PA-C have reviewed all documentation for this visit. The documentation on  06/19/2021  for the exam, diagnosis, procedures, and orders are all accurate and complete.    [name], PA-C  [facility] [phone] (phone) [phone] (fax)  [place]

## 2021-06-19 NOTE — Assessment & Plan Note (Signed)
We discussed what sleep apnea is and its potential relationship with feeling tired during the day despite sleeping.  We discussed home sleep test versus sleep testing facilities.  Patient would prefer a home sleep test if her insurance will cover it.  I recommended this to rule out sleep apnea.

## 2021-06-19 NOTE — Assessment & Plan Note (Signed)
Chronic and stable on medication. Continue as prescribed.

## 2021-06-19 NOTE — Assessment & Plan Note (Signed)
TSH and T4 measured 10/22 right within normal limits.  Continue current dosage

## 2021-06-19 NOTE — Assessment & Plan Note (Signed)
Goal LDL under 70.  Currently taking 10 mg of simvastatin.  Previously 3/22 LDL over 100 and elevated triglycerides.  We will recheck lipids today.  No need to repeat CMP, was checked end of October, liver enzymes within normal limits.

## 2021-06-20 LAB — HEMOGLOBIN A1C
Est. average glucose Bld gHb Est-mCnc: 243 mg/dL
Hgb A1c MFr Bld: 10.1 % — ABNORMAL HIGH (ref 4.8–5.6)

## 2021-06-20 LAB — LIPID PANEL
Chol/HDL Ratio: 4.9 ratio — ABNORMAL HIGH (ref 0.0–4.4)
Cholesterol, Total: 222 mg/dL — ABNORMAL HIGH (ref 100–199)
HDL: 45 mg/dL (ref >39)
LDL Chol Calc (NIH): 115 mg/dL — ABNORMAL HIGH (ref 0–99)
Triglycerides: 360 mg/dL — ABNORMAL HIGH (ref 0–149)
VLDL Cholesterol Cal: 62 mg/dL — ABNORMAL HIGH (ref 5–40)

## 2021-06-20 LAB — VITAMIN D 25 HYDROXY (VIT D DEFICIENCY, FRACTURES): Vit D, 25-Hydroxy: 25.9 ng/mL — ABNORMAL LOW (ref 30.0–100.0)

## 2021-06-22 ENCOUNTER — Other Ambulatory Visit: Payer: Self-pay | Admitting: Physician Assistant

## 2021-06-22 DIAGNOSIS — E1165 Type 2 diabetes mellitus with hyperglycemia: Secondary | ICD-10-CM

## 2021-06-22 DIAGNOSIS — E1169 Type 2 diabetes mellitus with other specified complication: Secondary | ICD-10-CM

## 2021-06-22 DIAGNOSIS — E785 Hyperlipidemia, unspecified: Secondary | ICD-10-CM

## 2021-06-22 MED ORDER — SIMVASTATIN 20 MG PO TABS: 20.0000 mg | ORAL_TABLET | Freq: Every day | ORAL | 3 refills | Status: DC

## 2021-06-22 MED ORDER — GLIPIZIDE ER 2.5 MG PO TB24: 2.5000 mg | ORAL_TABLET | Freq: Every day | ORAL | 1 refills | Status: DC

## 2021-07-05 ENCOUNTER — Other Ambulatory Visit: Payer: Self-pay | Admitting: Physician Assistant

## 2021-07-05 DIAGNOSIS — E034 Atrophy of thyroid (acquired): Secondary | ICD-10-CM

## 2021-07-05 DIAGNOSIS — J301 Allergic rhinitis due to pollen: Secondary | ICD-10-CM

## 2021-07-05 DIAGNOSIS — I10 Essential (primary) hypertension: Secondary | ICD-10-CM

## 2021-07-05 DIAGNOSIS — E039 Hypothyroidism, unspecified: Secondary | ICD-10-CM

## 2021-07-09 ENCOUNTER — Telehealth: Payer: Self-pay | Admitting: Physician Assistant

## 2021-07-09 DIAGNOSIS — J301 Allergic rhinitis due to pollen: Secondary | ICD-10-CM

## 2021-07-09 DIAGNOSIS — I10 Essential (primary) hypertension: Secondary | ICD-10-CM

## 2021-07-09 DIAGNOSIS — E1165 Type 2 diabetes mellitus with hyperglycemia: Secondary | ICD-10-CM

## 2021-07-09 DIAGNOSIS — E034 Atrophy of thyroid (acquired): Secondary | ICD-10-CM

## 2021-07-09 NOTE — Telephone Encounter (Signed)
Patient called, left VM on cell phone to return the call to the office. No answer on home phone. Will need to know which medications need refills and which pharmacy.

## 2021-07-09 NOTE — Telephone Encounter (Signed)
Patient called in needs refills but not sure which ones they are. Please cal back

## 2021-07-10 MED ORDER — TRULICITY 3 MG/0.5ML ~~LOC~~ SOAJ: 3.0000 mg | SUBCUTANEOUS | 3 refills | Status: DC

## 2021-07-10 MED ORDER — MONTELUKAST SODIUM 10 MG PO TABS: 10.0000 mg | ORAL_TABLET | Freq: Every day | ORAL | 3 refills | Status: DC

## 2021-07-10 MED ORDER — QUINAPRIL-HYDROCHLOROTHIAZIDE 20-25 MG PO TABS: 1.0000 | ORAL_TABLET | Freq: Every day | ORAL | 3 refills | Status: DC

## 2021-07-10 MED ORDER — LEVOTHYROXINE SODIUM 50 MCG PO TABS: 50.0000 ug | ORAL_TABLET | Freq: Every day | ORAL | 3 refills | Status: DC

## 2021-07-10 MED ORDER — AMLODIPINE BESYLATE 5 MG PO TABS: 5.0000 mg | ORAL_TABLET | Freq: Every day | ORAL | 3 refills | Status: DC

## 2021-07-10 MED ORDER — GLIPIZIDE ER 2.5 MG PO TB24: 2.5000 mg | ORAL_TABLET | Freq: Every day | ORAL | 1 refills | Status: DC

## 2021-07-10 NOTE — Addendum Note (Signed)
Addended by: Marlene Lard on: 07/10/2021 03:34 PM   Modules accepted: Orders

## 2021-07-10 NOTE — Telephone Encounter (Signed)
Call to patient- patient states she is not sure what medication were needed- the is a list and she states the pharmacy([facility])should have faxed it to the office. Will forward message.

## 2021-08-11 ENCOUNTER — Other Ambulatory Visit: Payer: Self-pay | Admitting: Physician Assistant

## 2021-08-11 DIAGNOSIS — M1990 Unspecified osteoarthritis, unspecified site: Secondary | ICD-10-CM

## 2021-08-11 NOTE — Telephone Encounter (Signed)
Copied from CRM [id number]. Topic: Quick Communication - Rx Refill/Question >> Aug 11, 2021  1:11 PM Marylen Ponto wrote: Medication: meloxicam (MOBIC) 15 MG tablet  Has the patient contacted their pharmacy? No. Pt previously told to contact provider (Agent: If no, request that the patient contact the pharmacy for the refill. If patient does not wish to contact the pharmacy document the reason why and proceed with request.) (Agent: If yes, when and what did the pharmacy advise?)  Preferred Pharmacy (with phone number or street name): [id number] [place], [place] - [address] AT [facility] [address] [facility]  Phone: [phone] Fax: [phone]  Has the patient been seen for an appointment in the last year OR does the patient have an upcoming appointment? Yes.    Agent: Please be advised that RX refills may take up to 3 business days. We ask that you follow-up with your pharmacy.

## 2021-08-11 NOTE — Telephone Encounter (Signed)
Requested medication (s) are due for refill today -expired Rx  Requested medication (s) are on the active medication list -yes  Future visit scheduled -no  Last refill: 04/08/20 #90 3RF  Notes to clinic: Request RF: expired Rx, provider no longer at practice  Requested Prescriptions  Pending Prescriptions Disp Refills   meloxicam (MOBIC) 15 MG tablet 90 tablet 3    Sig: Take 1 tablet (15 mg total) by mouth daily.     Analgesics:  COX2 Inhibitors Failed - 08/11/2021  1:57 PM      Failed - HGB in normal range and within 360 days    Hemoglobin  Date Value Ref Range Status  05/22/2021 16.3 (H) 11.1 - 15.9 g/dL Final          Passed - Cr in normal range and within 360 days    Creatinine, Ser  Date Value Ref Range Status  05/22/2021 0.87 0.57 - 1.00 mg/dL Final          Passed - Patient is not pregnant      Passed - Valid encounter within last 12 months    Recent Outpatient Visits           1 month ago Encounter for physical examination   [facility], [place], PA-C   2 months ago Yeast infection involving the vagina and surrounding area   [facility], [place], PA-C   4 months ago Acute non-recurrent pansinusitis   [facility] [place], Marzella Schlein, MD   6 months ago Type 2 diabetes mellitus with other specified complication, without long-term current use of insulin [facility])   [facility] [place], Marzella Schlein, MD   10 months ago Essential (primary) hypertension   [facility] [place], [place], [place]                 Requested Prescriptions  Pending Prescriptions Disp Refills   meloxicam (MOBIC) 15 MG tablet 90 tablet 3    Sig: Take 1 tablet (15 mg total) by mouth daily.     Analgesics:  COX2 Inhibitors Failed - 08/11/2021  1:57 PM      Failed - HGB in normal range and within 360 days    Hemoglobin  Date Value Ref Range Status  05/22/2021 16.3 (H) 11.1 - 15.9 g/dL Final           Passed - Cr in normal range and within 360 days    Creatinine, Ser  Date Value Ref Range Status  05/22/2021 0.87 0.57 - 1.00 mg/dL Final          Passed - Patient is not pregnant      Passed - Valid encounter within last 12 months    Recent Outpatient Visits           1 month ago Encounter for physical examination   [facility], [place], PA-C   2 months ago Yeast infection involving the vagina and surrounding area   [facility], [place], PA-C   4 months ago Acute non-recurrent pansinusitis   [facility] [place], Marzella Schlein, MD   6 months ago Type 2 diabetes mellitus with other specified complication, without long-term current use of insulin [facility])   [facility] [place], Marzella Schlein, MD   10 months ago Essential (primary) hypertension   [facility], [place], [place]

## 2021-09-07 ENCOUNTER — Ambulatory Visit (INDEPENDENT_AMBULATORY_CARE_PROVIDER_SITE_OTHER): Payer: 59

## 2021-09-07 DIAGNOSIS — Z0000 Encounter for general adult medical examination without abnormal findings: Secondary | ICD-10-CM | POA: Diagnosis not present

## 2021-09-07 DIAGNOSIS — Z1231 Encounter for screening mammogram for malignant neoplasm of breast: Secondary | ICD-10-CM | POA: Diagnosis not present

## 2021-09-07 NOTE — Progress Notes (Signed)
Virtual Visit via Telephone Note  I connected with  Christine Clarke on 09/07/21 at  3:00 PM EST by telephone and verified that I am speaking with the correct person using two identifiers.  Location: Patient: home Provider: [facility] Persons participating in the virtual visit: [place]   I discussed the limitations, risks, security and privacy concerns of performing an evaluation and management service by telephone and the availability of in person appointments. The patient expressed understanding and agreed to proceed.  Interactive audio and video telecommunications were attempted between this nurse and patient, however failed, due to patient having technical difficulties OR patient did not have access to video capability.  We continued and completed visit with audio only.  Some vital signs may be absent or patient reported.   Dionisio David, LPN  Subjective:   Christine Clarke is a 71 y.o. female who presents for Medicare Annual (Subsequent) preventive examination.  Review of Systems           Objective:    There were no vitals filed for this visit. There is no height or weight on file to calculate BMI.  Advanced Directives 11/18/2015  Does Patient Have a Medical Advance Directive? No    Current Medications (verified) Outpatient Encounter Medications as of 09/07/2021  Medication Sig   amLODipine (NORVASC) 5 MG tablet Take 1 tablet (5 mg total) by mouth daily.   amoxicillin-clavulanate (AUGMENTIN) 875-125 MG tablet amoxicillin 875 mg-potassium clavulanate 125 mg tablet  TAKE 1 TABLET BY MOUTH TWICE DAILY FOR 7 DAYS   azithromycin (ZITHROMAX) 250 MG tablet azithromycin 250 mg tablet  TAKE 2 TABLETS BY MOUTH FOR 1 DAY THEN TAKE 1 TABLET BY MOUTH DAILY FOR 4 DAYS   Blood Glucose Monitoring Suppl (CONTOUR NEXT ONE) KIT 1 kit by Does not apply route daily. To check blood sugar daily   BOTOX 100 units SOLR injection    clotrimazole ([employer] CLOTRIMAZOLE) 1 % cream Apply 1  application topically 2 (two) times daily.   COVID-19 At Home Test (COVID-19 OTC ANTIGEN 1-PACK VI) BinaxNOW COVID-19 Ag Self Test kit  FOLLOW PACKAGE DIRECTIONS   Dulaglutide (TRULICITY) 3 MV/7.8IO SOPN Inject 3 mg as directed once a week.   escitalopram (LEXAPRO) 10 MG tablet Take 1 tablet (10 mg total) by mouth at bedtime.   fluconazole (DIFLUCAN) 150 MG tablet fluconazole 150 mg tablet  TAKE 1 TABLET BY MOUTH. IF NOT IMPROVED IN 3 DAYS TAKE SECOND DOSE   fluticasone (FLONASE) 50 MCG/ACT nasal spray USE 1 SPRAY INTO BOTH  NOSTRILS DAILY   glipiZIDE (GLUCOTROL XL) 2.5 MG 24 hr tablet Take 1 tablet (2.5 mg total) by mouth daily before breakfast.   glucose blood (CONTOUR NEXT TEST) test strip To check blood sugar daily   levothyroxine (SYNTHROID) 50 MCG tablet Take 1 tablet (50 mcg total) by mouth daily.   meloxicam (MOBIC) 15 MG tablet Take 1 tablet (15 mg total) by mouth daily.   meloxicam (MOBIC) 15 MG tablet Mobic 15 mg tablet  Take 1 tablet every day by oral route.   montelukast (SINGULAIR) 10 MG tablet Take 1 tablet (10 mg total) by mouth daily.   nystatin-triamcinolone ointment (MYCOLOG) Apply 1 application topically 2 (two) times daily.   quinapril-hydrochlorothiazide (ACCURETIC) 20-25 MG tablet Take 1 tablet by mouth daily.   simvastatin (ZOCOR) 20 MG tablet Take 1 tablet (20 mg total) by mouth at bedtime.   triamcinolone cream (KENALOG) 0.1 % Apply 1 application topically 2 (two) times daily.  TRULICITY 1.5 YW/7.3XT SOPN Inject 1.5 mg into the skin once a week.   No facility-administered encounter medications on file as of 09/07/2021.    Allergies (verified) Sulfa antibiotics and Metformin and related   History: Past Medical History:  Diagnosis Date   Allergy    Diabetes mellitus without complication ([place])    Hyperlipidemia    Hypertension    Past Surgical History:  Procedure Laterality Date   ABDOMINAL HYSTERECTOMY  1981   partial   BLADDER SURGERY  1981   BREAST  EXCISIONAL BIOPSY Left 1998   Family History  Problem Relation Age of Onset   Hypertension Mother    Breast cancer Neg Hx    Social History   Socioeconomic History   Marital status: Divorced    Spouse name: Not on file   Number of children: Not on file   Years of education: Not on file   Highest education level: Not on file  Occupational History   Not on file  Tobacco Use   Smoking status: Never   Smokeless tobacco: Never  Vaping Use   Vaping Use: Never used  Substance and Sexual Activity   Alcohol use: Yes    Comment: occasional   Drug use: No   Sexual activity: Not on file  Other Topics Concern   Not on file  Social History Narrative   Not on file   Social Determinants of Health   Financial Resource Strain: Not on file  Food Insecurity: Not on file  Transportation Needs: Not on file  Physical Activity: Not on file  Stress: Not on file  Social Connections: Not on file    Tobacco Counseling Counseling given: Not Answered   Clinical Intake:                 Diabetic?yes Nutrition Risk Assessment:  Has the patient had any N/V/D within the last 2 months?  No  Does the patient have any non-healing wounds?  No  Has the patient had any unintentional weight loss or weight gain?  No   Diabetes:  Is the patient diabetic?  Yes  If diabetic, was a CBG obtained today?  No  Did the patient bring in their glucometer from home?  No  How often do you monitor your CBG's? Once per week.   Financial Strains and Diabetes Management:  Are you having any financial strains with the device, your supplies or your medication? No .  Does the patient want to be seen by Chronic Care Management for management of their diabetes?  No  Would the patient like to be referred to a Nutritionist or for Diabetic Management?  No   Diabetic Exams:  Diabetic Eye Exam: Completed has appointment coming up this week. Overdue for diabetic eye exam. Pt has been advised about the  importance in completing this exam.   Diabetic Foot Exam: Completed 06/19/21. Pt has been advised about the importance in completing this exam.       Activities of Daily Living In your present state of health, do you have any difficulty performing the following activities: 06/19/2021 10/08/2020  Hearing? Tempie Donning  Vision? N Y  Difficulty concentrating or making decisions? N Y  Walking or climbing stairs? N N  Dressing or bathing? N N  Doing errands, shopping? N Y  Some recent data might be hidden    Patient Care Team: [name], PA-C as PCP - General (Physician Assistant)  Indicate any recent Medical Services you may have received from  other than [facility] providers in the past year (date may be approximate).     Assessment:   This is a routine wellness examination for Christine Clarke.  Hearing/Vision screen No results found.  Dietary issues and exercise activities discussed:     Goals Addressed   None    Depression Screen PHQ 2/9 Scores 06/19/2021 10/08/2020 05/21/2020 04/24/2019 03/08/2018 02/09/2017 12/09/2015  PHQ - 2 Score _0 0 0 0  PHQ- 9 Score _1 - -    Fall Risk Fall Risk  06/19/2021 10/08/2020 04/24/2019 03/08/2018 02/09/2017  Falls in the past year? 1 1 0 No No  Number falls in past yr: 0 1 0 - -  Injury with Fall? 0 0 0 - -  Risk for fall due to : - History of fall(s) - - -  Follow up - Falls evaluation completed - - -    FALL RISK PREVENTION PERTAINING TO THE HOME:  Any stairs in or around the home? Yes  If so, are there any without handrails? No  Home free of loose throw rugs in walkways, pet beds, electrical cords, etc? Yes  Adequate lighting in your home to reduce risk of falls? Yes   ASSISTIVE DEVICES UTILIZED TO PREVENT FALLS:  Life alert? No  Use of a cane, walker or w/c? No  Grab bars in the bathroom? No  Shower chair or bench in shower? No  Elevated toilet seat or a handicapped toilet? No   Cognitive Function:Normal cognitive status assessed by  direct observation by this Nurse Health Advisor. No abnormalities found.          Immunizations Immunization History  Administered Date(s) Administered   Fluad Quad(high Dose 65+) 04/24/2019, 05/21/2020, 05/22/2021   Pneumococcal Conjugate-13 06/20/2014   Pneumococcal Polysaccharide-23 11/18/2015   Tdap 01/28/2012   Zoster Recombinat (Shingrix) 04/24/2019, 06/26/2019   Zoster, Live 01/28/2012    TDAP status: Due, Education has been provided regarding the importance of this vaccine. Advised may receive this vaccine at local pharmacy or Health Dept. Aware to provide a copy of the vaccination record if obtained from local pharmacy or Health Dept. Verbalized acceptance and understanding.  Flu Vaccine status: Up to date  Pneumococcal vaccine status: Up to date  Covid-19 vaccine status: Completed vaccines  Qualifies for Shingles Vaccine? Yes   Zostavax completed Yes   Shingrix Completed?: Yes  Screening Tests Health Maintenance  Topic Date Due   COVID-19 Vaccine (1) Never done   COLONOSCOPY (Pts 45-58yr Insurance coverage will need to be confirmed)  09/28/2020   MAMMOGRAM  08/20/2021   OPHTHALMOLOGY EXAM  09/14/2021 (Originally 09/04/2020)   HEMOGLOBIN A1C  12/17/2021   TETANUS/TDAP  01/27/2022   FOOT EXAM  06/19/2022   Pneumonia Vaccine 71 Years old  Completed   INFLUENZA VACCINE  Completed   DEXA SCAN  Completed   Hepatitis C Screening  Completed   Zoster Vaccines- Shingrix  Completed   HPV VACCINES  Aged Out    Health Maintenance  Health Maintenance Due  Topic Date Due   COVID-19 Vaccine (1) Never done   COLONOSCOPY (Pts 45-420yrInsurance coverage will need to be confirmed)  09/28/2020   MAMMOGRAM  08/20/2021    Colorectal cancer screening: Type of screening: Colonoscopy. Completed has Cologuard test to complete. Repeat every 3 years  Mammogram status: Completed 08/20/20. Repeat every year  Bone Density status: Completed 04/12/18. Results reflect: Bone density  results: NORMAL. Repeat every 3 years.  Lung Cancer Screening: (Low  Dose CT Chest recommended if Age 80-80 years, 30 pack-year currently smoking OR have quit w/in 15years.) does qualify.    Additional Screening:  Hepatitis C Screening: does qualify; Completed no  Vision Screening: Recommended annual ophthalmology exams for early detection of glaucoma and other disorders of the eye. Is the patient up to date with their annual eye exam?  Yes  Who is the provider or what is the name of the office in which the patient attends annual eye exams? [facility] If pt is not established with a provider, would they like to be referred to a provider to establish care? No .   Dental Screening: Recommended annual dental exams for proper oral hygiene  Community Resource Referral / Chronic Care Management: CRR required this visit?  No   CCM required this visit?  No      Plan:     I have personally reviewed and noted the following in the patients chart:   Medical and social history Use of alcohol, tobacco or illicit drugs  Current medications and supplements including opioid prescriptions.  Functional ability and status Nutritional status Physical activity Advanced directives List of other physicians Hospitalizations, surgeries, and ER visits in previous 12 months Vitals Screenings to include cognitive, depression, and falls Referrals and appointments  In addition, I have reviewed and discussed with patient certain preventive protocols, quality metrics, and best practice recommendations. A written personalized care plan for preventive services as well as general preventive health recommendations were provided to patient.     Dionisio David, LPN   04/10/5789   Nurse Notes: none

## 2021-09-07 NOTE — Patient Instructions (Signed)
[name] , Thank you for taking time to come for your Medicare Wellness Visit. I appreciate your ongoing commitment to your health goals. Please review the following plan we discussed and let me know if I can assist you in the future.   Screening recommendations/referrals: Colonoscopy: has a Cologuard kit to do Mammogram: 08/20/20 Bone Density: 04/12/18 Recommended yearly ophthalmology/optometry visit for glaucoma screening and checkup Recommended yearly dental visit for hygiene and checkup  Vaccinations: Influenza vaccine: 05/22/21 Pneumococcal vaccine: 11/18/15 Tdap vaccine: 01/28/12 Shingles vaccine: Shingrix 04/24/19, 06/26/19   Zostavax  01/28/12   Covid-19:10/10/19, 07/18/20  Advanced directives: no  Conditions/risks identified: none  Next appointment: Follow up in one year for your annual wellness visit 09/08/22 @ 3:40 by phone   Preventive Care 65 Years and Older, Female Preventive care refers to lifestyle choices and visits with your health care provider that can promote health and wellness. What does preventive care include? A yearly physical exam. This is also called an annual well check. Dental exams once or twice a year. Routine eye exams. Ask your health care provider how often you should have your eyes checked. Personal lifestyle choices, including: Daily care of your teeth and gums. Regular physical activity. Eating a healthy diet. Avoiding tobacco and drug use. Limiting alcohol use. Practicing safe sex. Taking low-dose aspirin every day. Taking vitamin and mineral supplements as recommended by your health care provider. What happens during an annual well check? The services and screenings done by your health care provider during your annual well check will depend on your age, overall health, lifestyle risk factors, and family history of disease. Counseling  Your health care provider may ask you questions about your: Alcohol use. Tobacco use. Drug use. Emotional  well-being. Home and relationship well-being. Sexual activity. Eating habits. History of falls. Memory and ability to understand (cognition). Work and work Statistician. Reproductive health. Screening  You may have the following tests or measurements: Height, weight, and BMI. Blood pressure. Lipid and cholesterol levels. These may be checked every 5 years, or more frequently if you are over 37 years old. Skin check. Lung cancer screening. You may have this screening every year starting at age 48 if you have a 30-pack-year history of smoking and currently smoke or have quit within the past 15 years. Fecal occult blood test (FOBT) of the stool. You may have this test every year starting at age 60. Flexible sigmoidoscopy or colonoscopy. You may have a sigmoidoscopy every 5 years or a colonoscopy every 10 years starting at age 2. Hepatitis C blood test. Hepatitis B blood test. Sexually transmitted disease (STD) testing. Diabetes screening. This is done by checking your blood sugar (glucose) after you have not eaten for a while (fasting). You may have this done every 1-3 years. Bone density scan. This is done to screen for osteoporosis. You may have this done starting at age 52. Mammogram. This may be done every 1-2 years. Talk to your health care provider about how often you should have regular mammograms. Talk with your health care provider about your test results, treatment options, and if necessary, the need for more tests. Vaccines  Your health care provider may recommend certain vaccines, such as: Influenza vaccine. This is recommended every year. Tetanus, diphtheria, and acellular pertussis (Tdap, Td) vaccine. You may need a Td booster every 10 years. Zoster vaccine. You may need this after age 71. Pneumococcal 13-valent conjugate (PCV13) vaccine. One dose is recommended after age 72. Pneumococcal polysaccharide (PPSV23) vaccine. One dose  is recommended after age 66. Talk to your  health care provider about which screenings and vaccines you need and how often you need them. This information is not intended to replace advice given to you by your health care provider. Make sure you discuss any questions you have with your health care provider. Document Released: 08/15/2015 Document Revised: 04/07/2016 Document Reviewed: 05/20/2015 Elsevier Interactive Patient Education  2017 [employer] Prevention in the Home Falls can cause injuries. They can happen to people of all ages. There are many things you can do to make your home safe and to help prevent falls. What can I do on the outside of my home? Regularly fix the edges of walkways and driveways and fix any cracks. Remove anything that might make you trip as you walk through a door, such as a raised step or threshold. Trim any bushes or trees on the path to your home. Use bright outdoor lighting. Clear any walking paths of anything that might make someone trip, such as rocks or tools. Regularly check to see if handrails are loose or broken. Make sure that both sides of any steps have handrails. Any raised decks and porches should have guardrails on the edges. Have any leaves, snow, or ice cleared regularly. Use sand or salt on walking paths during winter. Clean up any spills in your garage right away. This includes oil or grease spills. What can I do in the bathroom? Use night lights. Install grab bars by the toilet and in the tub and shower. Do not use towel bars as grab bars. Use non-skid mats or decals in the tub or shower. If you need to sit down in the shower, use a plastic, non-slip stool. Keep the floor dry. Clean up any water that spills on the floor as soon as it happens. Remove soap buildup in the tub or shower regularly. Attach bath mats securely with double-sided non-slip rug tape. Do not have throw rugs and other things on the floor that can make you trip. What can I do in the bedroom? Use night  lights. Make sure that you have a light by your bed that is easy to reach. Do not use any sheets or blankets that are too big for your bed. They should not hang down onto the floor. Have a firm chair that has side arms. You can use this for support while you get dressed. Do not have throw rugs and other things on the floor that can make you trip. What can I do in the kitchen? Clean up any spills right away. Avoid walking on wet floors. Keep items that you use a lot in easy-to-reach places. If you need to reach something above you, use a strong step stool that has a grab bar. Keep electrical cords out of the way. Do not use floor polish or wax that makes floors slippery. If you must use wax, use non-skid floor wax. Do not have throw rugs and other things on the floor that can make you trip. What can I do with my stairs? Do not leave any items on the stairs. Make sure that there are handrails on both sides of the stairs and use them. Fix handrails that are broken or loose. Make sure that handrails are as long as the stairways. Check any carpeting to make sure that it is firmly attached to the stairs. Fix any carpet that is loose or worn. Avoid having throw rugs at the top or bottom of the stairs.  If you do have throw rugs, attach them to the floor with carpet tape. Make sure that you have a light switch at the top of the stairs and the bottom of the stairs. If you do not have them, ask someone to add them for you. What else can I do to help prevent falls? Wear shoes that: Do not have high heels. Have rubber bottoms. Are comfortable and fit you well. Are closed at the toe. Do not wear sandals. If you use a stepladder: Make sure that it is fully opened. Do not climb a closed stepladder. Make sure that both sides of the stepladder are locked into place. Ask someone to hold it for you, if possible. Clearly mark and make sure that you can see: Any grab bars or handrails. First and last  steps. Where the edge of each step is. Use tools that help you move around (mobility aids) if they are needed. These include: Canes. Walkers. Scooters. Crutches. Turn on the lights when you go into a dark area. Replace any light bulbs as soon as they burn out. Set up your furniture so you have a clear path. Avoid moving your furniture around. If any of your floors are uneven, fix them. If there are any pets around you, be aware of where they are. Review your medicines with your doctor. Some medicines can make you feel dizzy. This can increase your chance of falling. Ask your doctor what other things that you can do to help prevent falls. This information is not intended to replace advice given to you by your health care provider. Make sure you discuss any questions you have with your health care provider. Document Released: 05/15/2009 Document Revised: 12/25/2015 Document Reviewed: 08/23/2014 Elsevier Interactive Patient Education  2017 [employer].

## 2021-09-09 DIAGNOSIS — L821 Other seborrheic keratosis: Secondary | ICD-10-CM | POA: Diagnosis not present

## 2021-09-09 DIAGNOSIS — L814 Other melanin hyperpigmentation: Secondary | ICD-10-CM | POA: Diagnosis not present

## 2021-09-09 DIAGNOSIS — L720 Epidermal cyst: Secondary | ICD-10-CM | POA: Diagnosis not present

## 2021-09-09 DIAGNOSIS — X32XXXA Exposure to sunlight, initial encounter: Secondary | ICD-10-CM | POA: Diagnosis not present

## 2021-09-25 ENCOUNTER — Encounter: Payer: Self-pay | Admitting: Physician Assistant

## 2021-09-25 ENCOUNTER — Other Ambulatory Visit: Payer: Self-pay | Admitting: Physician Assistant

## 2021-09-25 DIAGNOSIS — B3731 Acute candidiasis of vulva and vagina: Secondary | ICD-10-CM

## 2021-09-25 MED ORDER — FLUCONAZOLE 150 MG PO TABS: ORAL_TABLET | ORAL | 0 refills | Status: DC

## 2021-09-29 ENCOUNTER — Ambulatory Visit (INDEPENDENT_AMBULATORY_CARE_PROVIDER_SITE_OTHER): Payer: Medicare HMO | Admitting: Physician Assistant

## 2021-09-29 ENCOUNTER — Encounter: Payer: Self-pay | Admitting: Physician Assistant

## 2021-09-29 ENCOUNTER — Other Ambulatory Visit: Payer: Self-pay

## 2021-09-29 VITALS — BP 136/80 | HR 91 | Ht 60.0 in | Wt 187.6 lb

## 2021-09-29 DIAGNOSIS — R1012 Left upper quadrant pain: Secondary | ICD-10-CM | POA: Diagnosis not present

## 2021-09-29 DIAGNOSIS — R400 Somnolence: Secondary | ICD-10-CM | POA: Diagnosis not present

## 2021-09-29 DIAGNOSIS — B3731 Acute candidiasis of vulva and vagina: Secondary | ICD-10-CM | POA: Diagnosis not present

## 2021-09-29 DIAGNOSIS — H60503 Unspecified acute noninfective otitis externa, bilateral: Secondary | ICD-10-CM

## 2021-09-29 DIAGNOSIS — H6983 Other specified disorders of Eustachian tube, bilateral: Secondary | ICD-10-CM

## 2021-09-29 MED ORDER — OMEPRAZOLE 20 MG PO CPDR: 20.0000 mg | DELAYED_RELEASE_CAPSULE | Freq: Every day | ORAL | 3 refills | Status: DC

## 2021-09-29 MED ORDER — CLOTRIMAZOLE 1 % EX CREA: 1.0000 "application " | TOPICAL_CREAM | Freq: Two times a day (BID) | CUTANEOUS | 0 refills | Status: DC

## 2021-09-29 MED ORDER — FLUTICASONE PROPIONATE 50 MCG/ACT NA SUSP: 2.0000 | Freq: Every day | NASAL | 6 refills | Status: DC

## 2021-09-29 MED ORDER — NEOMYCIN-POLYMYXIN-HC 3.5-10000-1 OT SOLN: 3.0000 [drp] | Freq: Two times a day (BID) | OTIC | 0 refills | Status: DC

## 2021-09-29 NOTE — Progress Notes (Signed)
I,[name],acting as a Education administrator for [employer], PA-C.,have documented all relevant documentation on the behalf of Christine Kirschner, PA-C,as directed by  Christine Kirschner, PA-C while in the presence of Christine Kirschner, PA-C.  Acute Office Visit  Subjective:    Patient ID: Christine Clarke, female    DOB: 07-Apr-1951, 71 y.o.   MRN: [id number]  Cc. Multiple complaints  Bilateral ear pain --she tried to clean out her ears herself, and since then has had itching and pain.  --She also has bad seasonal allergies and feels more nasal congestion. Denies fevers, chills, body aches.  Daytime sleepiness -Pt states she did not get the home sleep test information -Continues to fall asleep during the day and not sleep well at night --Epworth Sleep Scale 10--Abnormal sleepiness  Abdominal pain --left side of abdomen, from her lower left abdomen to her upper left abdomen. Unable to describe nature of pain, when it occurs. Describes as uncomfortable. Has not tried anything to relieve pain. Not positional in nature, has not paid attention to if it is related to food. Denies changes in bowel movements, denies urinary symptoms.   Vaginal / Pubic rash --Slightly improves with diflucan/clotrimazole but not completely. Denies worsening   Past Medical History:  Diagnosis Date   Allergy    Diabetes mellitus without complication ([place])    Hyperlipidemia    Hypertension     Past Surgical History:  Procedure Laterality Date   ABDOMINAL HYSTERECTOMY  1981   partial   BLADDER SURGERY  1981   BREAST EXCISIONAL BIOPSY Left 1998    Family History  Problem Relation Age of Onset   Hypertension Mother    Breast cancer Neg Hx     Social History   Socioeconomic History   Marital status: Divorced    Spouse name: Not on file   Number of children: Not on file   Years of education: Not on file   Highest education level: Not on file  Occupational History   Not on file  Tobacco Use   Smoking status:  Never   Smokeless tobacco: Never  Vaping Use   Vaping Use: Never used  Substance and Sexual Activity   Alcohol use: Yes    Comment: occasional   Drug use: No   Sexual activity: Not on file  Other Topics Concern   Not on file  Social History Narrative   Not on file   Social Determinants of Health   Financial Resource Strain: Low Risk    Difficulty of Paying Living Expenses: Not hard at all  Food Insecurity: No Food Insecurity   Worried About Charity fundraiser in the Last Year: Never true   Chicken in the Last Year: Never true  Transportation Needs: No Transportation Needs   Lack of Transportation (Medical): No   Lack of Transportation (Non-Medical): No  Physical Activity: Inactive   Days of Exercise per Week: 0 days   Minutes of Exercise per Session: 0 min  Stress: No Stress Concern Present   Feeling of Stress : Only a little  Social Connections: Socially Isolated   Frequency of Communication with Friends and Family: More than three times a week   Frequency of Social Gatherings with Friends and Family: Once a week   Attends Religious Services: Never   Marine scientist or Organizations: No   Attends Archivist Meetings: Never   Marital Status: Divorced  Human resources officer Violence: Not At Risk   Fear of Current  or Ex-Partner: No   Emotionally Abused: No   Physically Abused: No   Sexually Abused: No    Outpatient Medications Prior to Visit  Medication Sig Dispense Refill   amLODipine (NORVASC) 5 MG tablet Take 1 tablet (5 mg total) by mouth daily. 90 tablet 3   amoxicillin-clavulanate (AUGMENTIN) 875-125 MG tablet amoxicillin 875 mg-potassium clavulanate 125 mg tablet  TAKE 1 TABLET BY MOUTH TWICE DAILY FOR 7 DAYS (Patient not taking: Reported on 09/07/2021)     azithromycin (ZITHROMAX) 250 MG tablet azithromycin 250 mg tablet  TAKE 2 TABLETS BY MOUTH FOR 1 DAY THEN TAKE 1 TABLET BY MOUTH DAILY FOR 4 DAYS (Patient not taking: Reported on 09/07/2021)      Blood Glucose Monitoring Suppl (CONTOUR NEXT ONE) KIT 1 kit by Does not apply route daily. To check blood sugar daily 1 kit 0   BOTOX 100 units SOLR injection      clotrimazole (CVS CLOTRIMAZOLE) 1 % cream Apply 1 application topically 2 (two) times daily. 30 g 0   COVID-19 At Home Test (COVID-19 OTC ANTIGEN 1-PACK VI) BinaxNOW COVID-19 Ag Self Test kit  FOLLOW PACKAGE DIRECTIONS     Dulaglutide (TRULICITY) 3 HL/4.5GY SOPN Inject 3 mg as directed once a week. 6 mL 3   escitalopram (LEXAPRO) 10 MG tablet Take 1 tablet (10 mg total) by mouth at bedtime. 90 tablet 3   fluconazole (DIFLUCAN) 150 MG tablet fluconazole 150 mg tablet  TAKE 1 TABLET BY MOUTH. IF NOT IMPROVED IN 3 DAYS TAKE SECOND DOSE 2 tablet 0   fluticasone (FLONASE) 50 MCG/ACT nasal spray USE 1 SPRAY INTO BOTH  NOSTRILS DAILY 48 g 2   glipiZIDE (GLUCOTROL XL) 2.5 MG 24 hr tablet Take 1 tablet (2.5 mg total) by mouth daily before breakfast. 90 tablet 1   glucose blood (CONTOUR NEXT TEST) test strip To check blood sugar daily 100 each 12   levothyroxine (SYNTHROID) 50 MCG tablet Take 1 tablet (50 mcg total) by mouth daily. 90 tablet 3   meloxicam (MOBIC) 15 MG tablet Take 1 tablet (15 mg total) by mouth daily. 90 tablet 3   meloxicam (MOBIC) 15 MG tablet Mobic 15 mg tablet  Take 1 tablet every day by oral route.     montelukast (SINGULAIR) 10 MG tablet Take 1 tablet (10 mg total) by mouth daily. (Patient not taking: Reported on 09/07/2021) 90 tablet 3   nystatin-triamcinolone ointment (MYCOLOG) Apply 1 application topically 2 (two) times daily. 30 g 1   quinapril-hydrochlorothiazide (ACCURETIC) 20-25 MG tablet Take 1 tablet by mouth daily. 90 tablet 3   simvastatin (ZOCOR) 20 MG tablet Take 1 tablet (20 mg total) by mouth at bedtime. 90 tablet 3   triamcinolone cream (KENALOG) 0.1 % Apply 1 application topically 2 (two) times daily. 30 g 0   TRULICITY 1.5 BW/3.8LH SOPN Inject 1.5 mg into the skin once a week. (Patient not taking:  Reported on 09/07/2021)     No facility-administered medications prior to visit.    Allergies  Allergen Reactions   Sulfa Antibiotics Rash    Erythema multiforme   Metformin And Related Diarrhea    Review of Systems  Constitutional:  Positive for fatigue. Negative for fever.  Cardiovascular:  Negative for chest pain and leg swelling.  Gastrointestinal:  Positive for abdominal pain. Negative for blood in stool, constipation, diarrhea, nausea and vomiting.      Objective:    Physical Exam Constitutional:      General: She is awake.  Appearance: She is well-developed.  HENT:     Head: Normocephalic.  Eyes:     Conjunctiva/sclera: Conjunctivae normal.  Cardiovascular:     Rate and Rhythm: Normal rate and regular rhythm.     Heart sounds: Normal heart sounds.  Pulmonary:     Effort: Pulmonary effort is normal.     Breath sounds: Normal breath sounds.  Abdominal:     General: Abdomen is flat. There is no distension.     Palpations: Abdomen is soft.     Tenderness: There is no abdominal tenderness.  Skin:    General: Skin is warm.  Neurological:     Mental Status: She is alert and oriented to person, place, and time.  Psychiatric:        Attention and Perception: Attention normal.        Mood and Affect: Mood normal.        Speech: Speech normal.        Behavior: Behavior is cooperative.    LMP  (LMP Unknown)  Wt Readings from Last 3 Encounters:  06/19/21 185 lb 14.4 oz (84.3 kg)  05/22/21 187 lb 4.8 oz (85 kg)  03/16/21 184 lb (83.5 kg)    Health Maintenance Due  Topic Date Due   OPHTHALMOLOGY EXAM  09/04/2020   COVID-19 Vaccine (3 - Booster for Pfizer series) 09/12/2020   COLONOSCOPY (Pts 45-40yr Insurance coverage will need to be confirmed)  09/28/2020   MAMMOGRAM  08/20/2021    There are no preventive care reminders to display for this patient.   Lab Results  Component Value Date   TSH 3.350 05/22/2021   Lab Results  Component Value Date   WBC  6.3 05/22/2021   HGB 16.3 (H) 05/22/2021   HCT 46.4 05/22/2021   MCV 91 05/22/2021   PLT 174 05/22/2021   Lab Results  Component Value Date   NA 139 05/22/2021   K 3.7 05/22/2021   CO2 26 05/22/2021   GLUCOSE 312 (H) 05/22/2021   BUN 17 05/22/2021   CREATININE 0.87 05/22/2021   BILITOT 0.5 05/22/2021   ALKPHOS 62 05/22/2021   AST 22 05/22/2021   ALT 36 (H) 05/22/2021   PROT 6.2 05/22/2021   ALBUMIN 4.2 05/22/2021   CALCIUM 9.2 05/22/2021   EGFR 71 05/22/2021   Lab Results  Component Value Date   CHOL 222 (H) 06/19/2021   Lab Results  Component Value Date   HDL 45 06/19/2021   Lab Results  Component Value Date   LDLCALC 115 (H) 06/19/2021   Lab Results  Component Value Date   TRIG 360 (H) 06/19/2021   Lab Results  Component Value Date   CHOLHDL 4.9 (H) 06/19/2021   Lab Results  Component Value Date   HGBA1C 10.1 (H) 06/19/2021       Assessment & Plan:   Problem List Items Addressed This Visit       Nervous and Auditory   Eustachian tube dysfunction   Relevant Medications   fluticasone (FLONASE) 50 MCG/ACT nasal spray   Acute otitis externa of both ears - Primary    Rx cortisporin drops. Secondary to trauma from cleaning ears out       Relevant Medications   neomycin-polymyxin-hydrocortisone (CORTISPORIN) OTIC solution     Genitourinary   Yeast infection involving the vagina and surrounding area    Yeast vs lichen planus ? Advised pt if not improved w/ current round of diflucan will refer to derm      Relevant  Medications   clotrimazole (CVS CLOTRIMAZOLE) 1 % cream     Other   Left upper quadrant abdominal pain    Difficult to determine etiology of pain from pt history and exam. Will do trial of omeprazole 20 mg po AM empty stomach to see if this helps with symptoms.        Relevant Medications   omeprazole (PRILOSEC) 20 MG capsule   Daytime sleepiness    Pt never received home sleep test At this pt will refer for an inperson sleep  study r/o sleep apnea      Relevant Orders   PSG Sleep Study    I, Christine Kirschner, PA-C have reviewed all documentation for this visit. The documentation on  09/29/2021 for the exam, diagnosis, procedures, and orders are all accurate and complete.  Christine Kirschner, PA-C [facility] [address] [place], [place], [address] Office: [phone] Fax: [phone]

## 2021-09-29 NOTE — Assessment & Plan Note (Signed)
Rx cortisporin drops. Secondary to trauma from cleaning ears out

## 2021-09-29 NOTE — Assessment & Plan Note (Signed)
Pt never received home sleep test At this pt will refer for an inperson sleep study r/o sleep apnea

## 2021-09-29 NOTE — Assessment & Plan Note (Signed)
Yeast vs lichen planus ? Advised pt if not improved w/ current round of diflucan will refer to derm

## 2021-09-29 NOTE — Assessment & Plan Note (Signed)
Difficult to determine etiology of pain from pt history and exam. Will do trial of omeprazole 20 mg po AM empty stomach to see if this helps with symptoms.

## 2021-10-01 DIAGNOSIS — M25561 Pain in right knee: Secondary | ICD-10-CM | POA: Diagnosis not present

## 2021-10-01 DIAGNOSIS — M1711 Unilateral primary osteoarthritis, right knee: Secondary | ICD-10-CM | POA: Diagnosis not present

## 2021-10-01 DIAGNOSIS — G5131 Clonic hemifacial spasm, right: Secondary | ICD-10-CM | POA: Diagnosis not present

## 2021-10-01 LAB — HM DIABETES EYE EXAM

## 2021-10-05 DIAGNOSIS — S42295A Other nondisplaced fracture of upper end of left humerus, initial encounter for closed fracture: Secondary | ICD-10-CM | POA: Diagnosis not present

## 2021-10-13 DIAGNOSIS — S42295D Other nondisplaced fracture of upper end of left humerus, subsequent encounter for fracture with routine healing: Secondary | ICD-10-CM | POA: Diagnosis not present

## 2021-10-16 ENCOUNTER — Ambulatory Visit: Payer: Self-pay

## 2021-10-16 NOTE — Telephone Encounter (Signed)
?  Chief Complaint: vaginal itching ?Symptoms: vaginal itching ?Frequency:  ?Pertinent Negatives: Patient denies discharge ?Disposition: [] ED /[] Urgent Care (no appt availability in office) / [] Appointment(In office/virtual)/ []  [place] [facility]/ [] Home Care/ [] Refused Recommended Disposition /[]  Mobile Bus/ []  Follow-up with PCP ?Additional Notes: Pt would like a refill on the Diflucan and clotrimazole.Pt is unablr to get into the office due to a shoulder injury.  ? ?Summary: vaginal itch  ? Pt called in stating she has an a itch on her vaginal area, and had received something before for it, that helped. There were no sooner available appts, pt requested a call back. Please advise.   ?  ? ?Answer Assessment - Initial Assessment Questions ?1. DRUG NAME: "What medicine do you need to have refilled?" ?    Diflucan Clotrimazole ?2. REFILLS REMAINING: "How many refills are remaining?" (Note: The label on the medicine or pill bottle will show how many refills are remaining. If there are no refills remaining, then a renewal may be needed.) ?    0 ?3. EXPIRATION DATE: "What is the expiration date?" (Note: The label states when the prescription will expire, and thus can no longer be refilled.) ?    0 ?4. PRESCRIBING HCP: "Who prescribed it?" Reason: If prescribed by specialist, call should be referred to that group. ?    ?5. SYMPTOMS: "Do you have any symptoms?" ?    Yes - itching ?6. PREGNANCY: "Is there any chance that you are pregnant?" "When was your last menstrual period?" ?    na ? ?Protocols used: Medication Refill and Renewal Call-A-AH ? ?

## 2021-10-19 ENCOUNTER — Other Ambulatory Visit: Payer: Self-pay | Admitting: Physician Assistant

## 2021-10-19 DIAGNOSIS — B3731 Acute candidiasis of vulva and vagina: Secondary | ICD-10-CM

## 2021-10-20 ENCOUNTER — Other Ambulatory Visit: Payer: Self-pay

## 2021-10-20 DIAGNOSIS — B3731 Acute candidiasis of vulva and vagina: Secondary | ICD-10-CM

## 2021-10-20 MED ORDER — CLOTRIMAZOLE 1 % EX CREA: 1.0000 "application " | TOPICAL_CREAM | Freq: Two times a day (BID) | CUTANEOUS | 0 refills | Status: DC

## 2021-10-22 ENCOUNTER — Telehealth: Payer: Self-pay

## 2021-10-22 NOTE — Telephone Encounter (Signed)
Copied from [place] [phone] [phone]. Topic: General - Other ?>> Oct 22, 2021  3:03 PM Bayard Beaver wrote: ?Reason for CRM: pt called in about referral to Dermatologist. I dont see anything in the system about this. Please call back ?

## 2021-10-26 DIAGNOSIS — G5131 Clonic hemifacial spasm, right: Secondary | ICD-10-CM | POA: Diagnosis not present

## 2021-10-26 NOTE — Telephone Encounter (Signed)
Called pt to advise that referral has been sent to [facility] because they are usually faster getting patients in for an appointment but her vm is full. If she calls back she can contact them at [phone],Thanks ?

## 2021-10-26 NOTE — Telephone Encounter (Signed)
Pt returned call and said that she has appt tomorrow with the doctor that you already referred her to. Hilma Favors at [facility]. She is questioning that they have sent her notification that she will owe $158 for procedure. She wants to know how they know what they will be doing when they have not seen her yet. ?

## 2021-10-27 ENCOUNTER — Other Ambulatory Visit: Payer: Self-pay

## 2021-10-27 ENCOUNTER — Ambulatory Visit: Payer: Medicare HMO | Admitting: Dermatology

## 2021-10-27 DIAGNOSIS — R21 Rash and other nonspecific skin eruption: Secondary | ICD-10-CM

## 2021-10-27 DIAGNOSIS — L750 Bromhidrosis: Secondary | ICD-10-CM

## 2021-10-27 MED ORDER — FLUCONAZOLE 200 MG PO TABS: 200.0000 mg | ORAL_TABLET | Freq: Every day | ORAL | 0 refills | Status: DC

## 2021-10-27 MED ORDER — CLINDAMYCIN PHOSPHATE 1 % EX SOLN: Freq: Two times a day (BID) | CUTANEOUS | 1 refills | Status: DC

## 2021-10-27 MED ORDER — KETOCONAZOLE 2 % EX CREA: 1.0000 "application " | TOPICAL_CREAM | Freq: Two times a day (BID) | CUTANEOUS | 0 refills | Status: AC

## 2021-10-27 NOTE — Patient Instructions (Addendum)
Stop triamcinolone cream  ? ? ? ?Gentle Skin Care Guide ? ?1. Bathe no more than once a day. ? ?2. Avoid bathing in hot water ? ?3. Use a mild soap like Dove, Vanicream, Cetaphil, CeraVe. Can use Lever 2000 or Cetaphil antibacterial soap ? ?4. Use soap only where you need it. On most days, use it under your arms, between your legs, and on your feet. Let the water rinse other areas unless visibly dirty. ? ?5. When you get out of the bath/shower, use a towel to gently blot your skin dry, don't rub it. ? ?6. While your skin is still a little damp, apply a moisturizing cream such as Vanicream, CeraVe, Cetaphil, Eucerin, Sarna lotion or plain Vaseline Jelly. For hands apply Neutrogena [place] Hand Cream or Excipial Hand Cream. ? ?7. Reapply moisturizer any time you start to itch or feel dry. ? ?8. Sometimes using free and clear laundry detergents can be helpful. Fabric softener sheets should be avoided. Downy Free & Gentle liquid, or any liquid fabric softener that is free of dyes and perfumes, it acceptable to use ? ?9. If your doctor has given you prescription creams you may apply moisturizers over them  ? ? ?If You Need Anything After Your Visit ? ?If you have any questions or concerns for your doctor, please call our main line at [phone] and press option 4 to reach your doctor's medical assistant. If no one answers, please leave a voicemail as directed and we will return your call as soon as possible. Messages left after 4 pm will be answered the following business day.  ? ?You may also send [place] a message via MyChart. We typically respond to MyChart messages within 1-2 business days. ? ?For prescription refills, please ask your pharmacy to contact our office. Our fax number is [phone]. ? ?If you have an urgent issue when the clinic is closed that cannot wait until the next business day, you can page your doctor at the number below.   ? ?Please note that while we do our best to be available for urgent  issues outside of office hours, we are not available 24/7.  ? ?If you have an urgent issue and are unable to reach [place], you may choose to seek medical care at your doctor's office, retail clinic, urgent care center, or emergency room. ? ?If you have a medical emergency, please immediately call 911 or go to the emergency department. ? ?Pager Numbers ? ?- Dr. Gwen Pounds: [phone] ? ?- Dr. Neale Burly: [phone] ? ?- Dr. Roseanne Reno: [phone] ? ?In the event of inclement weather, please call our main line at [phone] for an update on the status of any delays or closures. ? ?Dermatology Medication Tips: ?Please keep the boxes that topical medications come in in order to help keep track of the instructions about where and how to use these. Pharmacies typically print the medication instructions only on the boxes and not directly on the medication tubes.  ? ?If your medication is too expensive, please contact our office at [phone] option 4 or send [place] a message through MyChart.  ? ?We are unable to tell what your co-pay for medications will be in advance as this is different depending on your insurance coverage. However, we may be able to find a substitute medication at lower cost or fill out paperwork to get insurance to cover a needed medication.  ? ?If a prior authorization is required to get your medication covered by your insurance company, please allow  [place] 1-2 business days to complete this process. ? ?Drug prices often vary depending on where the prescription is filled and some pharmacies may offer cheaper prices. ? ?The website www.goodrx.com contains coupons for medications through different pharmacies. The prices here do not account for what the cost may be with help from insurance (it may be cheaper with your insurance), but the website can give you the price if you did not use any insurance.  ?- You can print the associated coupon and take it with your prescription to the pharmacy.  ?- You may also stop by  our office during regular business hours and pick up a GoodRx coupon card.  ?- If you need your prescription sent electronically to a different pharmacy, notify our office through [facility] or by phone at [phone] option 4. ? ? ? ? ?Si Usted Necesita Algo Despu?s de Su Visita ? ?Tambi?n puede enviarnos un mensaje a trav?s de MyChart. Por lo general respondemos a los mensajes de MyChart en el transcurso de 1 a 2 d?as h?biles. ? ?Para renovar recetas, por favor pida a su farmacia que se ponga en contacto con nuestra oficina. Nuestro n?mero de fax es el [phone]. ? ?Si tiene un asunto urgente cuando la cl?nica est? cerrada y que no puede esperar hasta el siguiente d?a h?bil, puede llamar/localizar a su doctor(a) al n?mero que aparece a continuaci?n.  ? ?Por favor, tenga en cuenta que aunque hacemos todo lo posible para estar disponibles para asuntos urgentes fuera del horario de oficina, no estamos disponibles las 24 horas del d?a, los 7 d?as de la semana.  ? ?Si tiene un problema urgente y no puede comunicarse con nosotros, puede optar por buscar atenci?n m?dica  en el consultorio de su doctor(a), en una cl?nica privada, en un centro de atenci?n urgente o en una sala de emergencias. ? ?Si tiene Radio broadcast assistant m?dica, por favor llame inmediatamente al 911 o vaya a la sala de emergencias. ? ?N?meros de b?per ? ?- Dr. Gwen Pounds: [phone] ? ?- Dra. Moye: [phone] ? ?- Dra. Roseanne Reno: [phone] ? ?En caso de inclemencias del tiempo, por favor llame a nuestra l?nea principal al [phone] para una actualizaci?n sobre el estado de cualquier retraso o cierre. ? ?Consejos para la medicaci?n en dermatolog?a: ?Por favor, guarde las cajas en las que vienen los medicamentos de uso t?pico para ayudarle a seguir las instrucciones sobre d?nde y c?mo usarlos. Las farmacias generalmente imprimen las instrucciones del medicamento s?lo en las cajas y no directamente en los tubos del [place].  ? ?Si su  medicamento es muy caro, por favor, p?ngase en contacto con Rolm Gala llamando al [phone] y presione la opci?n 4 o env?enos un mensaje a trav?s de MyChart.  ? ?No podemos decirle cu?l ser? su copago por los medicamentos por adelantado ya que esto es diferente dependiendo de la cobertura de su seguro. Sin embargo, es posible que podamos encontrar un medicamento sustituto a Audiological scientist un formulario para que el seguro cubra el medicamento que se considera necesario.  ? ?Si se requiere Neomia Dear autorizaci?n previa para que su compa??a de seguros [place] su medicamento, por favor perm?tanos de 1 a 2 d?as h?biles para completar este proceso. ? ?Los precios de los medicamentos var?an con frecuencia dependiendo del Environmental consultant de d?nde se surte la receta y alguna farmacias pueden ofrecer precios m?s baratos. ? ?El sitio web www.goodrx.com tiene cupones para medicamentos de Health and safety inspector. Los precios aqu? no tienen en cuenta lo que podr?a  costar con la ayuda del seguro (puede ser m?s barato con su seguro), pero el sitio web puede darle el precio si no utiliz? ning?n seguro.  ?- Puede imprimir el cup?n correspondiente y llevarlo con su receta a la farmacia.  ?- Tambi?n puede pasar por nuestra oficina durante el horario de atenci?n regular y recoger una tarjeta de cupones de GoodRx.  ?- Si necesita que su receta se env?e electr?nicamente a Chiropodist, informe a nuestra oficina a trav?s de MyChart de [place] o por tel?fono llamando al [phone] y presione la opci?n 4. ? ?

## 2021-10-27 NOTE — Progress Notes (Signed)
? ?  New Patient Visit ? ?Subjective  ?Christine Clarke is a 71 y.o. female who presents for the following: New Patient (Initial Visit) (Patient here today concerning a itchy area at vaginal area that started in February. Patient was given a prescription for fluconazole 150 mg (2 day treatment) and clotrimazole cream to use at area daily. Patient reports she finished pills and is still very itchy. Patient also reports that she is experiencing some smelly areas at left arm. Patient recently injured left arm in a fall and is unable to move at this time. ). Patient thinks she has been using triamcinolone cream to vaginal area. ? ? ?The following portions of the chart were reviewed this encounter and updated as appropriate:  ?  ?  ? ?Review of Systems:  No other skin or systemic complaints except as noted in HPI or Assessment and Plan. ? ?Objective  ?Well appearing patient in no apparent distress; mood and affect are within normal limits. ? ?A focused examination was performed including vaginal area/groin and left arm . Relevant physical exam findings are noted in the Assessment and Plan. ? ?left arm / axilla ?Unable to examine today due to injury to left arm. She is unable to lift arm for exam.  ? ?Patient complains of bad odor under left arm and would like treatment. ? ?vaginal area / groin ?Erythema of labia minora with trace nonadherent soft white discharge  ?No ulcerations present at exam, no Wickham's striae  ?Oral mucosa is clear ?Patient complains of itch at vaginal area ? ? ? ? 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 ?Recommend Clindamycin lotion qd/bid- apply topically to left axilla as needed for odor control.  ? ?Continue OTC antiperspirant/deodorant qd ? ?clindamycin (CLEOCIN T) 1 % external solution - left arm / axilla ?Apply topically 2 (two) times daily. As needed apply to affected area of left arm ? ?Rash and other nonspecific skin eruption ?vaginal area / groin ? ?Chronic and persistent  condition. Condition is bothersome/symptomatic for patient. Currently flared. ?  ?Probable candida,  lichen planus less likely ? ?Start fluconazole 200 mg tablet - take 1 tablet by mouth daily for 7 days.  ?Start Ketoconazole cream 2 % - apply topically twice daily x 3 weeks to labia for yeast ? ?Will recheck in 3 weeks  ?If not responding to treatment will consider treating with topical steroid and/or possible biopsy ? ?Side effects of fluconazole (diflucan) include nausea, diarrhea, headache, dizziness, taste changes, rare risk of irritation of the liver, allergy, or decreased blood counts (which could show up as infection or tiredness).   ? ?fluconazole (DIFLUCAN) 200 MG tablet - vaginal area / groin ?Take 1 tablet (200 mg total) by mouth daily. ? ?ketoconazole (NIZORAL) 2 % cream - vaginal area / groin ?Apply 1 application. topically 2 (two) times daily. For outside of vaginal area for yeast. Use for 2 weeks ? ? ?Return for 3 week follow up on vaginal itching. ? ?I, Asher Muir, CMA, am acting as scribe for Willeen Niece, MD. ? ?Documentation: I have reviewed the above documentation for accuracy and completeness, and I agree with the above. ? ?Willeen Niece MD  ? ?

## 2021-11-03 DIAGNOSIS — S42295A Other nondisplaced fracture of upper end of left humerus, initial encounter for closed fracture: Secondary | ICD-10-CM | POA: Diagnosis not present

## 2021-11-03 DIAGNOSIS — H903 Sensorineural hearing loss, bilateral: Secondary | ICD-10-CM | POA: Diagnosis not present

## 2021-11-03 DIAGNOSIS — H6123 Impacted cerumen, bilateral: Secondary | ICD-10-CM | POA: Diagnosis not present

## 2021-11-03 DIAGNOSIS — H6063 Unspecified chronic otitis externa, bilateral: Secondary | ICD-10-CM | POA: Diagnosis not present

## 2021-11-17 ENCOUNTER — Ambulatory Visit: Payer: Medicare HMO | Admitting: Dermatology

## 2021-11-26 DIAGNOSIS — M25512 Pain in left shoulder: Secondary | ICD-10-CM | POA: Diagnosis not present

## 2021-12-02 DIAGNOSIS — M25512 Pain in left shoulder: Secondary | ICD-10-CM | POA: Diagnosis not present

## 2021-12-04 DIAGNOSIS — S42295A Other nondisplaced fracture of upper end of left humerus, initial encounter for closed fracture: Secondary | ICD-10-CM | POA: Diagnosis not present

## 2021-12-10 DIAGNOSIS — M25512 Pain in left shoulder: Secondary | ICD-10-CM | POA: Diagnosis not present

## 2021-12-17 DIAGNOSIS — M25512 Pain in left shoulder: Secondary | ICD-10-CM | POA: Diagnosis not present

## 2021-12-22 ENCOUNTER — Other Ambulatory Visit: Payer: Self-pay | Admitting: Physician Assistant

## 2021-12-22 ENCOUNTER — Ambulatory Visit: Payer: Self-pay | Admitting: *Deleted

## 2021-12-22 ENCOUNTER — Ambulatory Visit: Payer: Self-pay

## 2021-12-22 ENCOUNTER — Telehealth: Payer: Self-pay | Admitting: Physician Assistant

## 2021-12-22 DIAGNOSIS — R1012 Left upper quadrant pain: Secondary | ICD-10-CM

## 2021-12-22 DIAGNOSIS — E1165 Type 2 diabetes mellitus with hyperglycemia: Secondary | ICD-10-CM

## 2021-12-22 NOTE — Telephone Encounter (Addendum)
Patient called to discuss the concerns she called about. She says she didn't say the medications are too strong. She says she's talking about her diabetes medications and wants to know which one's she should be taking. I advised per her current list Trulicity 3 mg injection weekly and glipizide 2.5 mg daily. She asked for her past diabetes medications, advised Farxiga, Metformin, Jardiance. She asks should she be taking Jardiance. Advised per last OV note 01/20/2021 by Dr. Brita Romp, stop the [place]. She says to ask Ria Comment if she should stop it or take it along with what she currently takes, because different doctors have different opinions. I advised I will send this to Ria Comment and someone will call back with her recommendation. Patient then says she wants something prescribed for mosquito bites that she has, some type of cream and something for itching. Patient triaged for this in another encounter.

## 2021-12-22 NOTE — Telephone Encounter (Signed)
Summary: Pt request refill but there is no listing   Pt stated she contacted her pharmacy for a refill on the needles but she was told to contact the provider office to request refill. No listing for needles on medication list. Pt request call back to advise.      Chief Complaint: Needs Lancets Symptoms: NA Frequency: NA Pertinent Negatives: Patient denies NA Disposition: [] ED /[] Urgent Care (no appt availability in office) / [] Appointment(In office/virtual)/ []  [place] [facility]/ [] Home Care/ [] Refused Recommended Disposition /[] [place] [facility]/ [x]  Follow-up with PCP Additional Notes: Pt called pharmacy for refill of Lancets, not on current med profile. Noted last refills of 2017. PLease review.  After hours call  Reason for Disposition  [1] Caller has NON-URGENT medicine question about med that PCP prescribed AND [2] triager unable to answer question  Answer Assessment - Initial Assessment Questions 1. DRUG NAME: "What medicine do you need to have refilled?"     One Touch Lancets. 2. REFILLS REMAINING: "How many refills are remaining?" (Note: The label on the medicine or pill bottle will show how many refills are remaining. If there are no refills remaining, then a renewal may be needed.)  Protocols used: Medication Refill and Renewal Call-A-AH

## 2021-12-22 NOTE — Telephone Encounter (Signed)
Chief Complaint: Mosquito bites Symptoms: itching, a few red ones, about 10 bites located from the posterior waste down the thigh behind knees, a few are swollen Frequency: Onset Saturday night while sleeping Pertinent Negatives: Patient denies pain, other symptoms Disposition: [] ED /[] Urgent Care (no appt availability in office) / [x] Appointment(In office/virtual)/ []  [place] [facility]/ [] Home Care/ [x] Refused Recommended Disposition /[] [place] [facility]/ []  Follow-up with PCP Additional Notes: Advised she will need an OV for evaluation, she says she did not want an OV for this, just have to send in some cream or spray for itching, something to help the bites. Advised I will send this to and someone will call back with her recommendation.   Summary: insect bite / rx req   The patient has multiple mosquito bites that are causing them discomfort   The patient believes they were bitten on Saturday night 12/19/21   The patient is experiencing itching and discomfort and would like to be prescribed something for their discomfort   Please contact further          Reason for Disposition  [1] SEVERE local itching (i.e., interferes with work, school, sleep) AND [2] not improved after 24 hours of hydrocortisone cream  Answer Assessment - Initial Assessment Questions 1. TYPE of INSECT: "What type of insect was it?"      Mosquito 2. ONSET: "When did you get bitten?"      Saturday night when sleeping 3. LOCATION: "Where is the insect bite located?"      10 bites between knees, back of the legs and waist 4. REDNESS: "Is the area red or pink?" If Yes, ask: "What size is area of redness?" (inches or cm). "When did the redness start?"      Couple of red, unable to see them, size varies 5. PAIN: "Is there any pain?" If Yes, ask: "How bad is it?"  (Scale 1-10; or mild, moderate, severe)     No 6. ITCHING: "Does it itch?" If Yes, ask: "How bad is the itch?"    - MILD:  doesn't interfere with normal activities   - MODERATE-SEVERE: interferes with work, school, sleep, or other activities      Yes, moderate-severe (interferes with sleep or activity) 7. SWELLING: "How big is the swelling?" (inches, cm, or compare to coins)     A couple are 8. OTHER SYMPTOMS: "Do you have any other symptoms?"  (e.g., difficulty breathing, hives)     No 9. PREGNANCY: "Is there any chance you are pregnant?" "When was your last menstrual period?"     N/A  Protocols used: Insect Bite-A-AH

## 2021-12-22 NOTE — Telephone Encounter (Signed)
The patient would like to speak with a member of staff when possible  The patient shares that they would like to confirm that they are taking their diabetic medications correctly   The patient is concerned that they may not be taking all of the medications prescribed to them   The patient is also concerned that the medications they are taking may be too strong   Please contact further when possible

## 2021-12-23 MED ORDER — LANCETS MISC: 6 refills | Status: AC

## 2022-01-19 ENCOUNTER — Telehealth: Payer: Self-pay

## 2022-01-19 NOTE — Telephone Encounter (Unsigned)
Copied from CRM [id number]. Topic: General - Call Back - No Documentation >> Jan 19, 2022  4:14 PM Christine Clarke E wrote: Reason for CRM: Pt wants a call back to discuss alternative options for trulicity, she wants to speak to the clinic. Feels like she needs to be taking something in the meantime before she is seen again.  Best contact: [phone]

## 2022-01-21 ENCOUNTER — Other Ambulatory Visit: Payer: Self-pay | Admitting: Physician Assistant

## 2022-01-21 DIAGNOSIS — E1169 Type 2 diabetes mellitus with other specified complication: Secondary | ICD-10-CM

## 2022-01-21 DIAGNOSIS — D582 Other hemoglobinopathies: Secondary | ICD-10-CM

## 2022-01-21 DIAGNOSIS — E11620 Type 2 diabetes mellitus with diabetic dermatitis: Secondary | ICD-10-CM

## 2022-02-08 ENCOUNTER — Encounter: Payer: Self-pay | Admitting: Physician Assistant

## 2022-02-09 ENCOUNTER — Other Ambulatory Visit: Payer: Self-pay | Admitting: Physician Assistant

## 2022-02-09 DIAGNOSIS — R400 Somnolence: Secondary | ICD-10-CM

## 2022-02-12 DIAGNOSIS — E11620 Type 2 diabetes mellitus with diabetic dermatitis: Secondary | ICD-10-CM | POA: Diagnosis not present

## 2022-02-12 DIAGNOSIS — E785 Hyperlipidemia, unspecified: Secondary | ICD-10-CM | POA: Diagnosis not present

## 2022-02-12 DIAGNOSIS — E1169 Type 2 diabetes mellitus with other specified complication: Secondary | ICD-10-CM | POA: Diagnosis not present

## 2022-02-12 DIAGNOSIS — D582 Other hemoglobinopathies: Secondary | ICD-10-CM | POA: Diagnosis not present

## 2022-02-15 DIAGNOSIS — S42295A Other nondisplaced fracture of upper end of left humerus, initial encounter for closed fracture: Secondary | ICD-10-CM | POA: Diagnosis not present

## 2022-02-15 LAB — COMPREHENSIVE METABOLIC PANEL
ALT: 29 IU/L (ref 0–32)
AST: 27 IU/L (ref 0–40)
Albumin/Globulin Ratio: 1.8 (ref 1.2–2.2)
Albumin: 4.1 g/dL (ref 3.8–4.8)
Alkaline Phosphatase: 84 IU/L (ref 44–121)
BUN/Creatinine Ratio: 15 (ref 12–28)
BUN: 11 mg/dL (ref 8–27)
Bilirubin Total: 0.5 mg/dL (ref 0.0–1.2)
CO2: 21 mmol/L (ref 20–29)
Calcium: 9.2 mg/dL (ref 8.7–10.3)
Chloride: 99 mmol/L (ref 96–106)
Creatinine, Ser: 0.75 mg/dL (ref 0.57–1.00)
Globulin, Total: 2.3 g/dL (ref 1.5–4.5)
Glucose: 348 mg/dL — ABNORMAL HIGH (ref 70–99)
Potassium: 3.9 mmol/L (ref 3.5–5.2)
Sodium: 136 mmol/L (ref 134–144)
Total Protein: 6.4 g/dL (ref 6.0–8.5)
eGFR: 84 mL/min/{1.73_m2} (ref >59)

## 2022-02-15 LAB — HEMOGLOBIN A1C
Est. average glucose Bld gHb Est-mCnc: 292 mg/dL
Hgb A1c MFr Bld: 11.8 % — ABNORMAL HIGH (ref 4.8–5.6)

## 2022-02-15 LAB — LIPID PANEL
Chol/HDL Ratio: 4.5 ratio — ABNORMAL HIGH (ref 0.0–4.4)
Cholesterol, Total: 185 mg/dL (ref 100–199)
HDL: 41 mg/dL (ref >39)
LDL Chol Calc (NIH): 100 mg/dL — ABNORMAL HIGH (ref 0–99)
Triglycerides: 262 mg/dL — ABNORMAL HIGH (ref 0–149)
VLDL Cholesterol Cal: 44 mg/dL — ABNORMAL HIGH (ref 5–40)

## 2022-02-15 LAB — CBC WITH DIFFERENTIAL/PLATELET
Basophils Absolute: 0 10*3/uL (ref 0.0–0.2)
Basos: 1 %
EOS (ABSOLUTE): 0.1 10*3/uL (ref 0.0–0.4)
Eos: 2 %
Hematocrit: 46 % (ref 34.0–46.6)
Hemoglobin: 16 g/dL — ABNORMAL HIGH (ref 11.1–15.9)
Immature Grans (Abs): 0 10*3/uL (ref 0.0–0.1)
Immature Granulocytes: 1 %
Lymphocytes Absolute: 1.7 10*3/uL (ref 0.7–3.1)
Lymphs: 28 %
MCH: 31.9 pg (ref 26.6–33.0)
MCHC: 34.8 g/dL (ref 31.5–35.7)
MCV: 92 fL (ref 79–97)
Monocytes Absolute: 0.5 10*3/uL (ref 0.1–0.9)
Monocytes: 8 %
Neutrophils Absolute: 3.7 10*3/uL (ref 1.4–7.0)
Neutrophils: 60 %
Platelets: 180 10*3/uL (ref 150–450)
RBC: 5.02 x10E6/uL (ref 3.77–5.28)
RDW: 12.8 % (ref 11.7–15.4)
WBC: 6 10*3/uL (ref 3.4–10.8)

## 2022-02-17 ENCOUNTER — Ambulatory Visit (INDEPENDENT_AMBULATORY_CARE_PROVIDER_SITE_OTHER): Payer: Medicare HMO | Admitting: Physician Assistant

## 2022-02-17 ENCOUNTER — Encounter: Payer: Self-pay | Admitting: Physician Assistant

## 2022-02-17 VITALS — BP 150/85 | HR 93 | Ht 60.0 in | Wt 184.2 lb

## 2022-02-17 DIAGNOSIS — E1165 Type 2 diabetes mellitus with hyperglycemia: Secondary | ICD-10-CM | POA: Diagnosis not present

## 2022-02-17 DIAGNOSIS — I152 Hypertension secondary to endocrine disorders: Secondary | ICD-10-CM

## 2022-02-17 DIAGNOSIS — R1902 Left upper quadrant abdominal swelling, mass and lump: Secondary | ICD-10-CM | POA: Diagnosis not present

## 2022-02-17 DIAGNOSIS — E785 Hyperlipidemia, unspecified: Secondary | ICD-10-CM | POA: Diagnosis not present

## 2022-02-17 DIAGNOSIS — E1169 Type 2 diabetes mellitus with other specified complication: Secondary | ICD-10-CM

## 2022-02-17 DIAGNOSIS — E1159 Type 2 diabetes mellitus with other circulatory complications: Secondary | ICD-10-CM | POA: Diagnosis not present

## 2022-02-17 MED ORDER — SIMVASTATIN 40 MG PO TABS: 40.0000 mg | ORAL_TABLET | Freq: Every day | ORAL | 3 refills | Status: DC

## 2022-02-17 MED ORDER — EMPAGLIFLOZIN 10 MG PO TABS: 10.0000 mg | ORAL_TABLET | Freq: Every day | ORAL | 1 refills | Status: DC

## 2022-02-17 NOTE — Progress Notes (Signed)
I,[name],acting as a Education administrator for [employer], PA-C.,have documented all relevant documentation on the behalf of [name], PA-C,as directed by  [name], PA-C while in the presence of [name], PA-C.   Established patient visit   Patient: Christine Clarke   DOB: 10-29-1950   71 y.o. Female  MRN: [id number] Visit Date: 02/17/2022  Today's healthcare provider: [name], PA-C   Cc. DM II f/u , HLD f/u  Subjective    HPI  Pt is concerned about a lump in her left upper abdomen. She states it has been there for years denies change in size, pain, constipation, change in bm.  Diabetes Mellitus Type II, Follow-up  Lab Results  Component Value Date   HGBA1C 11.8 (H) 02/12/2022   HGBA1C 10.1 (H) 06/19/2021   HGBA1C 9.4 (A) 01/20/2021   Wt Readings from Last 3 Encounters:  02/17/22 184 lb 3.2 oz (83.6 kg)  09/29/21 187 lb 9.6 oz (85.1 kg)  06/19/21 185 lb 14.4 oz (84.3 kg)   Last seen for diabetes 8 months ago. (06/19/21) Management since then includes d/c farxiga d/t recurrent yeast infections. Keep Trulicity at current dose, add back Glipizide 2.5 mg to take in AM before breakfast. Previously was d/c bc she was controlled on one agent..  Pt has stopped taking her Trulicity secondary to cost.  She reports excellent compliance with treatment. She is not having side effects.  Symptoms: Yes fatigue No foot ulcerations  No appetite changes No nausea  No paresthesia of the feet  No polydipsia  Yes polyuria No visual disturbances   No vomiting     Home blood sugar records: fasting range: 300  Episodes of hypoglycemia? No    Current insulin regiment: none Most Recent Eye Exam: UTD Current exercise: none Current diet habits: well balanced  Pertinent Labs: Lab Results  Component Value Date   CHOL 185 02/12/2022   HDL 41 02/12/2022   LDLCALC 100 (H) 02/12/2022   TRIG 262 (H) 02/12/2022   CHOLHDL 4.5 (H) 02/12/2022   Lab Results   Component Value Date   NA 136 02/12/2022   K 3.9 02/12/2022   CREATININE 0.75 02/12/2022   EGFR 84 02/12/2022   MICROALBUR 50 03/08/2018     --------------------------------------------------------------------------------------------------- Lipid/Cholesterol, Follow-up  Last lipid panel Other pertinent labs  Lab Results  Component Value Date   CHOL 185 02/12/2022   HDL 41 02/12/2022   LDLCALC 100 (H) 02/12/2022   TRIG 262 (H) 02/12/2022   CHOLHDL 4.5 (H) 02/12/2022   Lab Results  Component Value Date   ALT 29 02/12/2022   AST 27 02/12/2022   PLT 180 02/12/2022   TSH 3.350 05/22/2021     She was last seen for this 8 months ago.  Management since that visit includes increase simvastatin to 20 mg .  She reports excellent compliance with treatment. She is not having side effects.   Symptoms: No chest pain No chest pressure/discomfort  No dyspnea No lower extremity edema  No numbness or tingling of extremity No orthopnea  No palpitations No paroxysmal nocturnal dyspnea  No speech difficulty No syncope   Current diet: well balanced Current exercise: none  The 10-year ASCVD risk score (Arnett DK, et al., 2019) is: 39.7%  ---------------------------------------------------------------------------------------------------   Medications: Outpatient Medications Prior to Visit  Medication Sig   amLODipine (NORVASC) 5 MG tablet Take 1 tablet (5 mg total) by mouth daily.   Blood Glucose Monitoring Suppl (CONTOUR NEXT ONE) KIT 1  kit by Does not apply route daily. To check blood sugar daily   BOTOX 100 units SOLR injection    clindamycin (CLEOCIN T) 1 % external solution Apply topically 2 (two) times daily. As needed apply to affected area of left arm   clotrimazole ([employer] CLOTRIMAZOLE) 1 % cream Apply 1 application. topically 2 (two) times daily.   COVID-19 At Home Test (COVID-19 OTC ANTIGEN 1-PACK VI) BinaxNOW COVID-19 Ag Self Test kit  FOLLOW PACKAGE DIRECTIONS    escitalopram (LEXAPRO) 10 MG tablet Take 1 tablet (10 mg total) by mouth at bedtime.   fluticasone (FLONASE) 50 MCG/ACT nasal spray Place 2 sprays into both nostrils daily.   glucose blood (CONTOUR NEXT TEST) test strip To check blood sugar daily   Lancets MISC To check blood sugar daily   levothyroxine (SYNTHROID) 50 MCG tablet Take 1 tablet (50 mcg total) by mouth daily.   meloxicam (MOBIC) 15 MG tablet Take 1 tablet (15 mg total) by mouth daily.   montelukast (SINGULAIR) 10 MG tablet Take 1 tablet (10 mg total) by mouth daily.   neomycin-polymyxin-hydrocortisone (CORTISPORIN) OTIC solution Place 3 drops into both ears in the morning and at bedtime.   omeprazole (PRILOSEC) 20 MG capsule TAKE 1 CAPSULE(20 MG) BY MOUTH DAILY IN THE MORNING ON AN EMPTY STOMACH   quinapril-hydrochlorothiazide (ACCURETIC) 20-25 MG tablet Take 1 tablet by mouth daily.   triamcinolone cream (KENALOG) 0.1 % Apply 1 application topically 2 (two) times daily.   [DISCONTINUED] fluconazole (DIFLUCAN) 150 MG tablet fluconazole 150 mg tablet  TAKE 1 TABLET BY MOUTH. IF NOT IMPROVED IN 3 DAYS TAKE SECOND DOSE   [DISCONTINUED] fluconazole (DIFLUCAN) 200 MG tablet Take 1 tablet (200 mg total) by mouth daily.   [DISCONTINUED] simvastatin (ZOCOR) 20 MG tablet Take 1 tablet (20 mg total) by mouth at bedtime.   Dulaglutide (TRULICITY) 3 ID/5.6YS SOPN Inject 3 mg as directed once a week. (Patient not taking: Reported on 02/17/2022)   glipiZIDE (GLUCOTROL XL) 2.5 MG 24 hr tablet Take 1 tablet (2.5 mg total) by mouth daily before breakfast.   No facility-administered medications prior to visit.    Review of Systems  Constitutional:  Negative for fatigue and fever.  Respiratory:  Negative for cough and shortness of breath.   Cardiovascular:  Negative for chest pain and leg swelling.  Gastrointestinal:  Negative for abdominal pain.  Neurological:  Negative for dizziness and headaches.       Objective    BP (!) 150/85 (BP  Location: Right Arm, Patient Position: Sitting, Cuff Size: Large)   Pulse 93   Ht 5' (1.524 m)   Wt 184 lb 3.2 oz (83.6 kg)   LMP  (LMP Unknown)   SpO2 98%   BMI 35.97 kg/m  Blood pressure (!) 150/85, pulse 93, height 5' (1.524 m), weight 184 lb 3.2 oz (83.6 kg), SpO2 98 %.   Physical Exam Constitutional:      General: She is awake.     Appearance: She is well-developed.  HENT:     Head: Normocephalic.  Eyes:     Conjunctiva/sclera: Conjunctivae normal.  Cardiovascular:     Rate and Rhythm: Normal rate and regular rhythm.     Heart sounds: Normal heart sounds.  Pulmonary:     Effort: Pulmonary effort is normal.     Breath sounds: Normal breath sounds.  Abdominal:     General: Abdomen is flat.     Palpations: Abdomen is soft. There is no mass.  Hernia: No hernia is present.  Skin:    General: Skin is warm.  Neurological:     Mental Status: She is alert and oriented to person, place, and time.  Psychiatric:        Attention and Perception: Attention normal.        Mood and Affect: Mood normal.        Speech: Speech normal.        Behavior: Behavior is cooperative.     No results found for any visits on 02/17/22.  Assessment & Plan     Problem List Items Addressed This Visit       Cardiovascular and Mediastinum   Hypertension associated with diabetes ([place])    Manages with amlodipine 5 mg and quinapril-hctz 20/25 mg,  Elevated in office today; pt does not check at home suspicion she is not consistent w/ meds If elevated next visit will make changes Reviewed cmp       Relevant Medications   empagliflozin (JARDIANCE) 10 MG TABS tablet   simvastatin (ZOCOR) 40 MG tablet     Endocrine   T2DM (type 2 diabetes mellitus) ([place]) - Primary    Uncontrolled. Previously managed with glipizide 2.5 mg, trulicity 3 mg. -- pt took three months of trulicity but next refill would have cost $500. Difficult to communicate goals w/ med management w/ patient Decision to add  jardiance 10 mg, as I don't want to add a DPP4, ideally I would like her back on a GLP 1 long term. a1c up to 11.8% From 10.1%  On statin LDL goal < 70, not at goal see HLD note Foot exam utd uacr today Referring to ccm for pharmacy assistance.  Pt requests endo referral; may not be a bad idea as she is likely to need insulin  F/u 3 mo      Relevant Medications   empagliflozin (JARDIANCE) 10 MG TABS tablet   simvastatin (ZOCOR) 40 MG tablet   Other Relevant Orders   Urine Microalbumin w/creat. ratio   Ambulatory referral to Endocrinology   AMB Referral to [facility] Coordinaton   Hyperlipidemia associated with type 2 diabetes mellitus (HCC)    LDL goal < 70 increasing simvastatin 20 mg to 40 mg.       Relevant Medications   empagliflozin (JARDIANCE) 10 MG TABS tablet   simvastatin (ZOCOR) 40 MG tablet     Other   Left upper quadrant abdominal mass    On exam there really is no mass in the area she is palpating I only palpate adipose tissue Per pt will order [place] of abdomen, potentially a hernia I am not feeling d/t overlying fat      Relevant Orders   US Abdomen Limited     Return in about 3 months (around 05/20/2022) for hypertension, DMII.      I, [name], PA-C have reviewed all documentation for this visit. The documentation on  02/17/2022 for the exam, diagnosis, procedures, and orders are all accurate and complete.  [name], PA-C [facility] [address] [place], [place], [address] Office: [phone] Fax: [place]

## 2022-02-17 NOTE — Assessment & Plan Note (Signed)
On exam there really is no mass in the area she is palpating I only palpate adipose tissue Per pt will order [place] of abdomen, potentially a hernia I am not feeling d/t overlying fat

## 2022-02-17 NOTE — Assessment & Plan Note (Signed)
LDL goal < 70 increasing simvastatin 20 mg to 40 mg.

## 2022-02-17 NOTE — Assessment & Plan Note (Addendum)
Manages with amlodipine 5 mg and quinapril-hctz 20/25 mg,  Elevated in office today; pt does not check at home suspicion she is not consistent w/ meds If elevated next visit will make changes Reviewed cmp

## 2022-02-17 NOTE — Assessment & Plan Note (Addendum)
Uncontrolled. Previously managed with glipizide 2.5 mg, trulicity 3 mg. -- pt took three months of trulicity but next refill would have cost $500. Difficult to communicate goals w/ med management w/ patient Decision to add jardiance 10 mg, as I don't want to add a DPP4, ideally I would like her back on a GLP 1 long term. a1c up to 11.8% From 10.1%  On statin LDL goal < 70, not at goal see HLD note Foot exam utd uacr today Referring to ccm for pharmacy assistance.  Pt requests endo referral; may not be a bad idea as she is likely to need insulin  F/u 3 mo

## 2022-02-18 LAB — MICROALBUMIN / CREATININE URINE RATIO
Creatinine, Urine: 36.6 mg/dL
Microalb/Creat Ratio: 9 mg/g creat (ref 0–29)
Microalbumin, Urine: 3.4 ug/mL

## 2022-02-22 ENCOUNTER — Telehealth: Payer: Self-pay | Admitting: Pharmacist

## 2022-02-22 NOTE — Progress Notes (Addendum)
[facility])                                            [facility] Quality Pharmacy Team    02/22/2022  Christine Clarke [birth year] [id number]  Trulicity is not accepting applications for patient assistance at this time. Discussed a change in therapy with [name] and she is ok with the change in therapy. Will reach out to PCP to discuss a change in therapy to Ozempic.   Reynold Bowen, PharmD [facility]  [facility] Clinical Pharmacist Office: [phone]

## 2022-02-22 NOTE — Progress Notes (Signed)
[facility])  [place] Team    02/22/2022  Christine Clarke [birth year] [id number]  Reason for referral: Medication Assistance with Trulicity and Jardiance   Referral source:  [name], PA-C Current insurance: [employer]  PMHx includes but not limited to:  DMT2  Outreach:  Successful telephone call with Christine Clarke .  HIPAA identifiers verified.    Objective: The 10-year ASCVD risk score (Arnett DK, et al., 2019) is: 39.7%   Values used to calculate the score:     Age: 71 years     Sex: Female     Is Non-Hispanic African American: No     Diabetic: Yes     Tobacco smoker: No     Systolic Blood Pressure: 257 mmHg     Is BP treated: Yes     HDL Cholesterol: 41 mg/dL     Total Cholesterol: 185 mg/dL  Lab Results  Component Value Date   CREATININE 0.75 02/12/2022   CREATININE 0.87 05/22/2021   CREATININE 0.78 10/08/2020    Lab Results  Component Value Date   HGBA1C 11.8 (H) 02/12/2022    Lipid Panel     Component Value Date/Time   CHOL 185 02/12/2022 1408   TRIG 262 (H) 02/12/2022 1408   HDL 41 02/12/2022 1408   CHOLHDL 4.5 (H) 02/12/2022 1408   LDLCALC 100 (H) 02/12/2022 1408    BP Readings from Last 3 Encounters:  02/17/22 (!) 150/85  09/29/21 136/80  06/19/21 132/76    Allergies  Allergen Reactions   Sulfa Antibiotics Rash    Erythema multiforme   Metformin And Related Diarrhea    Assessment: Spoke with patient via telephone and screened for medication assistance for Jardiance and Trulicity. Based on reported income, patient meets the requirement for both programs.   Medication Assistance Findings:  Medication assistance needs identified: Trulicity and Jardiance   Extra Help:  Not eligible for Extra Help Low Income Subsidy based on reported income and assets  Patient Assistance Programs: Trulicity  made by [employer] requirement met: Yes Out-of-pocket prescription expenditure met:   Not Applicable Patient has  met application requirements to apply for this program.    Jardiance made by [employer] requirement met: Yes Out-of-pocket prescription expenditure met:   Not Applicable Patient has met application requirements to apply for this program.    Additional medication assistance options reviewed with patient as warranted:  Insurance OTC catalogue  Plan: I will route patient assistance letter to [place] technician who will coordinate patient assistance program application process for medications listed above.  [facility] pharmacy technician will assist with obtaining all required documents from both patient and provider(s) and submit application(s) once completed.

## 2022-02-23 ENCOUNTER — Telehealth: Payer: Self-pay

## 2022-02-23 ENCOUNTER — Telehealth: Payer: Self-pay | Admitting: Pharmacy Technician

## 2022-02-23 DIAGNOSIS — Z596 Low income: Secondary | ICD-10-CM

## 2022-02-23 NOTE — Progress Notes (Signed)
[facility])                                            [facility] Quality Pharmacy Team    02/23/2022  Christine Clarke 03-06-1951 [id number]                                      Medication Assistance Referral  Referral From: [facility] RPh Tiffany B.  Medication/Company: [employer] / [employer] Patient application portion:  Mailed Provider application portion: Faxed  to Christine Clarke, [facility] Provider address/fax verified via: Office website  Medication/Company: Franki Monte / [employer] Patient application portion:  Mining engineer portion: Faxed  to Christine Clarke, [facility] Provider address/fax verified via: [employer]. Christine Clarke, CPhT [facility] [employer]  [phone]

## 2022-02-23 NOTE — Telephone Encounter (Signed)
Copied from CRM [id number]. Topic: General - Inquiry >> Feb 23, 2022 11:39 AM Tiffany B wrote: Faxing over provider portion for medication assistance ( 2 forms / 2 different medications ). Caller states this is her 3rd attempt faxing [phone]. Please note when received

## 2022-02-23 NOTE — Telephone Encounter (Signed)
-----   Message from Alfredia Ferguson, PA-C sent at 02/23/2022  2:27 PM EDT ----- Give her a call to pick up a sample box of ozempic

## 2022-02-25 ENCOUNTER — Other Ambulatory Visit: Payer: Medicare HMO

## 2022-03-03 ENCOUNTER — Telehealth: Payer: Self-pay

## 2022-03-03 NOTE — Telephone Encounter (Signed)
Copied from CRM [id number]. Topic: General - Other >> Mar 03, 2022 10:08 AM Macon Large wrote: Reason for CRM: Pt stated she has been out of town but has not forgotten that a prescription was left at the front desk for her. Pt stated she will be coming in to the office on Monday to pick up that prescription.

## 2022-03-08 ENCOUNTER — Ambulatory Visit
Admission: RE | Admit: 2022-03-08 | Discharge: 2022-03-08 | Disposition: A | Discharge status: Home / Self Care | Payer: Medicare HMO | Source: Ambulatory Visit | Attending: Physician Assistant | Admitting: Physician Assistant

## 2022-03-08 DIAGNOSIS — R1902 Left upper quadrant abdominal swelling, mass and lump: Secondary | ICD-10-CM | POA: Insufficient documentation

## 2022-03-08 DIAGNOSIS — R109 Unspecified abdominal pain: Secondary | ICD-10-CM | POA: Diagnosis not present

## 2022-03-30 ENCOUNTER — Ambulatory Visit: Payer: Self-pay

## 2022-03-30 NOTE — Patient Outreach (Signed)
  Care Coordination   03/30/2022 Name: Christine Clarke MRN: [id number] DOB: Feb 20, 1951   Care Coordination Outreach Attempts:  An unsuccessful telephone outreach was attempted today to offer the patient information about available care coordination services as a benefit of their health plan.   Follow Up Plan:  Additional outreach attempts will be made to offer the patient care coordination information and services.   Encounter Outcome:  No Answer  Care Coordination Interventions Activated:  No   Care Coordination Interventions:  No, not indicated    Alto Denver RN, MSN, CCM Community Care Coordinator [facility]  [facility] Mobile: [phone]

## 2022-04-14 ENCOUNTER — Telehealth: Payer: Self-pay | Admitting: Physician Assistant

## 2022-04-14 ENCOUNTER — Ambulatory Visit
Admission: RE | Admit: 2022-04-14 | Discharge: 2022-04-14 | Disposition: A | Discharge status: Home / Self Care | Payer: Medicare HMO | Source: Ambulatory Visit | Attending: Family Medicine | Admitting: Family Medicine

## 2022-04-14 DIAGNOSIS — Z1231 Encounter for screening mammogram for malignant neoplasm of breast: Secondary | ICD-10-CM | POA: Insufficient documentation

## 2022-04-14 DIAGNOSIS — I10 Essential (primary) hypertension: Secondary | ICD-10-CM

## 2022-04-14 MED ORDER — QUINAPRIL-HYDROCHLOROTHIAZIDE 20-25 MG PO TABS: 1.0000 | ORAL_TABLET | Freq: Every day | ORAL | 1 refills | Status: DC

## 2022-04-14 NOTE — Telephone Encounter (Signed)
[facility] faxed refill request for the following medications:  quinapril-hydrochlorothiazide (ACCURETIC) 20-25 MG tablet   Please advise.

## 2022-04-29 ENCOUNTER — Ambulatory Visit: Payer: Self-pay

## 2022-04-29 NOTE — Telephone Encounter (Signed)
  Chief Complaint: sinus infection Symptoms:  Sore throat, fatigue, teeth pain, cough, sinus pressure  Frequency: since Monday Pertinent Negatives: Patient denies fever, SOB Disposition: [] ED /[] Urgent Care (no appt availability in office) / [] Appointment(In office/virtual)/ []  [place] [facility]/ [] Home Care/ [x] Refused Recommended Disposition /[] [place] [facility]/ [x]  Follow-up with PCP Additional Notes: pt states she is only taking Zyrtec for sx. Advised pt she would need to do appt either in person or VV. Offered VV so pt didn't have to come in but she refused and states she doesn't know what a VV would do any good since she already spoke with me about sx. I explained to her that I wasn't able to send in abx and if she didn't want to do appt then I could send message back to providers and see if they could prescribe abx w/o visit. Pt preferred I do that.   Summary: Possible sinus infection/sore throat   The patient called in stating she believes she has a sinus infection. She does have allergies and she does take zyrtec. She states she is tender in her nose and she says she has a sore throat. She has drainage in her throat which makes it sore.  She states she feels bad and just doesn't want to come in for an appointment. She was offered a virtual appointment tomorrow which she declined stating she just wants to talk to a nurse. Please assist patient further.      Reason for Disposition  [1] Sinus congestion (pressure, fullness) AND [2] present > 10 days  Answer Assessment - Initial Assessment Questions 1. LOCATION: "Where does it hurt?"      Face, and nose  2. ONSET: "When did the sinus pain start?"  (e.g., hours, days)      Monday  3. SEVERITY: "How bad is the pain?"   (Scale 1-10; mild, moderate or severe)   - MILD (1-3): doesn't interfere with normal activities    - MODERATE (4-7): interferes with normal activities (e.g., work or school) or awakens from sleep   - SEVERE  (8-10): excruciating pain and patient unable to do any normal activities        Moderate  7. FEVER: "Do you have a fever?" If Yes, ask: "What is it, how was it measured, and when did it start?"      no 8. OTHER SYMPTOMS: "Do you have any other symptoms?" (e.g., sore throat, cough, earache, difficulty breathing)     Sore throat, fatigue, teeth pain, cough  Protocols used: Sinus Pain or Congestion-A-AH

## 2022-04-30 ENCOUNTER — Other Ambulatory Visit: Payer: Self-pay | Admitting: Physician Assistant

## 2022-04-30 DIAGNOSIS — E034 Atrophy of thyroid (acquired): Secondary | ICD-10-CM

## 2022-04-30 DIAGNOSIS — I10 Essential (primary) hypertension: Secondary | ICD-10-CM

## 2022-04-30 NOTE — Telephone Encounter (Signed)
LVMTCB. CRM created. Ok for PEC to advise 

## 2022-05-03 NOTE — Telephone Encounter (Signed)
Requested Prescriptions  Pending Prescriptions Disp Refills  . levothyroxine (SYNTHROID) 50 MCG tablet [Pharmacy Med Name: LEVOTHYROXINE 0.05MG  (50MCG) TAB] 90 tablet 0    Sig: TAKE 1 TABLET(50 MCG) BY MOUTH DAILY     Endocrinology:  Hypothyroid Agents Passed - 04/30/2022  7:30 PM      Passed - TSH in normal range and within 360 days    TSH  Date Value Ref Range Status  05/22/2021 3.350 0.450 - 4.500 uIU/mL Final         Passed - Valid encounter within last 12 months    Recent Outpatient Visits          2 months ago Type 2 diabetes mellitus with hyperglycemia, without long-term current use of insulin ([place])   [facility] Thedore Mins, [place], PA-C   7 months ago Acute otitis externa of both ears, unspecified type   [facility] Thedore Mins, [place], PA-C   10 months ago Encounter for physical examination   [employer], [place], PA-C   11 months ago Yeast infection involving the vagina and surrounding area   [employer], [place], PA-C   1 year ago Acute non-recurrent pansinusitis   [facility] [place], Dionne Bucy, MD      Future Appointments            In 2 weeks Drubel, Ria Comment, PA-C [facility], [facility]           . amLODipine ([place]) 5 MG tablet [Pharmacy Med Name: AMLODIPINE BESYLATE 5MG  TABLETS] 90 tablet 0    Sig: TAKE 1 TABLET(5 MG) BY MOUTH DAILY     Cardiovascular: Calcium Channel Blockers 2 Failed - 04/30/2022  7:30 PM      Failed - Last BP in normal range    BP Readings from Last 1 Encounters:  02/17/22 (!) 150/85         Passed - Last Heart Rate in normal range    Pulse Readings from Last 1 Encounters:  02/17/22 93         Passed - Valid encounter within last 6 months    Recent Outpatient Visits          2 months ago Type 2 diabetes mellitus with hyperglycemia, without long-term current use of insulin ([place])   [place], [place], PA-C   7  months ago Acute otitis externa of both ears, unspecified type   [facility] Thedore Mins, [place], PA-C   10 months ago Encounter for physical examination   [employer], [place], PA-C   11 months ago Yeast infection involving the vagina and surrounding area   [facility] Thedore Mins, [employer], PA-C   1 year ago Acute non-recurrent pansinusitis   [facility] [place], Dionne Bucy, MD      Future Appointments            In 2 weeks Thedore Mins, Ria Comment, PA-C [facility], PEC

## 2022-05-03 NOTE — Telephone Encounter (Signed)
Patient advised. As below. She reports she is doing okay for now.

## 2022-05-19 ENCOUNTER — Encounter: Payer: Self-pay | Admitting: Physician Assistant

## 2022-05-19 ENCOUNTER — Ambulatory Visit (INDEPENDENT_AMBULATORY_CARE_PROVIDER_SITE_OTHER): Payer: Medicare HMO | Admitting: Physician Assistant

## 2022-05-19 VITALS — BP 133/95 | HR 94 | Wt 176.5 lb

## 2022-05-19 DIAGNOSIS — I152 Hypertension secondary to endocrine disorders: Secondary | ICD-10-CM | POA: Diagnosis not present

## 2022-05-19 DIAGNOSIS — E559 Vitamin D deficiency, unspecified: Secondary | ICD-10-CM | POA: Diagnosis not present

## 2022-05-19 DIAGNOSIS — E039 Hypothyroidism, unspecified: Secondary | ICD-10-CM | POA: Diagnosis not present

## 2022-05-19 DIAGNOSIS — Z23 Encounter for immunization: Secondary | ICD-10-CM

## 2022-05-19 DIAGNOSIS — E785 Hyperlipidemia, unspecified: Secondary | ICD-10-CM | POA: Diagnosis not present

## 2022-05-19 DIAGNOSIS — E1159 Type 2 diabetes mellitus with other circulatory complications: Secondary | ICD-10-CM

## 2022-05-19 DIAGNOSIS — E1169 Type 2 diabetes mellitus with other specified complication: Secondary | ICD-10-CM | POA: Diagnosis not present

## 2022-05-19 DIAGNOSIS — E1165 Type 2 diabetes mellitus with hyperglycemia: Secondary | ICD-10-CM

## 2022-05-19 LAB — POCT GLYCOSYLATED HEMOGLOBIN (HGB A1C): Hemoglobin A1C: 12.7 % — AB (ref 4.0–5.6)

## 2022-05-19 NOTE — Assessment & Plan Note (Addendum)
Uncontrolled. a1c today 12.7% compared to 7/23 11.8% Managing with glipizide 2.5, pt reports she only started jardiance 10 mg a week ago 2/2 cost. A referral was placed 3 mo ago to pharmacy, but pt never completed paperwork for jardiance/ozempic assistance.  Will reach out to pharmacy again for assistance. I discussed several options going forward for pt as her current regimen is not controlling her diabetes. Pt has an appt with endocrinology next month, she would prefer to wait and hear their opinion. Foot exam, uacr, optho utd On statin  On acei  F/u 3 mo

## 2022-05-19 NOTE — Assessment & Plan Note (Signed)
Historically will check for improvement

## 2022-05-19 NOTE — Assessment & Plan Note (Signed)
Will check tsh/t4 and refill levothyroxine if stable

## 2022-05-19 NOTE — Assessment & Plan Note (Addendum)
Pt states she doesn't know if she is taking quinapril-hctz. I can see she filled this last month, but she has a typed med list from home, and the med is not on it. Pressure elevated today, but as we are unsure what we are compliant with, advised she double check her meds at home and f/u in 4 weeks

## 2022-05-19 NOTE — Assessment & Plan Note (Signed)
LDL goal < 70 needs fasting lipids checked She had increased simvastatin 20 mg to now 40 mg

## 2022-05-19 NOTE — Patient Instructions (Signed)
Upcoming appointment with Endocrinologist: [facility] at [facility] 06/10/2022 01:00 PM Alanson Aly

## 2022-05-19 NOTE — Progress Notes (Signed)
I,[name],acting as a Education administrator for [employer], PA-C.,have documented all relevant documentation on the behalf of Christine Kirschner, PA-C,as directed by  Christine Kirschner, PA-C while in the presence of Christine Kirschner, PA-C.   Established patient visit   Patient: Christine Clarke   DOB: Jan 06, 1951   71 y.o. Female  MRN: [id number] Visit Date: 05/19/2022  Today's healthcare provider: [name], PA-C   Cc. DM and HTN f/u  Subjective    HPI  Hypertension, follow-up  BP Readings from Last 3 Encounters:  05/19/22 (!) 133/95  02/17/22 (!) 150/85  09/29/21 136/80   Wt Readings from Last 3 Encounters:  05/19/22 176 lb 8 oz (80.1 kg)  02/17/22 184 lb 3.2 oz (83.6 kg)  09/29/21 187 lb 9.6 oz (85.1 kg)     She was last seen for hypertension 3 months ago.  BP at that visit was 150/85. Management since that visit includes continue with amlodipine 5 mg and quinapril-hctz 20/25 mg.  She reports fair compliance with treatment. She is not having side effects.  She is following a Regular diet. She is not exercising. She does not smoke.  Use of agents associated with hypertension: none.   Outside blood pressures are not being checked Symptoms: No chest pain No chest pressure  No palpitations No syncope  No dyspnea No orthopnea  No paroxysmal nocturnal dyspnea No lower extremity edema   Pertinent labs Lab Results  Component Value Date   CHOL 185 02/12/2022   HDL 41 02/12/2022   LDLCALC 100 (H) 02/12/2022   TRIG 262 (H) 02/12/2022   CHOLHDL 4.5 (H) 02/12/2022   Lab Results  Component Value Date   NA 136 02/12/2022   K 3.9 02/12/2022   CREATININE 0.75 02/12/2022   EGFR 84 02/12/2022   GLUCOSE 348 (H) 02/12/2022   TSH 3.350 05/22/2021     The 10-year ASCVD risk score (Arnett DK, et al., 2019) is: 32.8%  ---------------------------------------------------------------------------------------------------  Diabetes Mellitus Type II, Follow-up  Lab Results   Component Value Date   HGBA1C 12.7 (A) 05/19/2022   HGBA1C 11.8 (H) 02/12/2022   HGBA1C 10.1 (H) 06/19/2021   Wt Readings from Last 3 Encounters:  05/19/22 176 lb 8 oz (80.1 kg)  02/17/22 184 lb 3.2 oz (83.6 kg)  09/29/21 187 lb 9.6 oz (85.1 kg)   Last seen for diabetes 3 months ago.  Management since then includes add jardiance 10 mg. Referring to ccm for pharmacy assistance. Endo referral placed She reports good compliance with treatment. She is not having side effects. Symptoms: Yes fatigue No foot ulcerations  No appetite changes No nausea  No paresthesia of the feet  Yes polydipsia  No polyuria No visual disturbances   No vomiting     Home blood sugar records:  not being checked  Episodes of hypoglycemia? No    Current insulin regiment: none Most Recent Eye Exam: 10/01/21 Current exercise: none Current diet habits: well balanced  Pertinent Labs: Lab Results  Component Value Date   CHOL 185 02/12/2022   HDL 41 02/12/2022   LDLCALC 100 (H) 02/12/2022   TRIG 262 (H) 02/12/2022   CHOLHDL 4.5 (H) 02/12/2022   Lab Results  Component Value Date   NA 136 02/12/2022   K 3.9 02/12/2022   CREATININE 0.75 02/12/2022   EGFR 84 02/12/2022   MICROALBUR 50 03/08/2018   LABMICR 3.4 02/17/2022     ---------------------------------------------------------------------------------------------------   Medications: Outpatient Medications Prior to Visit  Medication Sig  amLODipine (NORVASC) 5 MG tablet TAKE 1 TABLET(5 MG) BY MOUTH DAILY   Blood Glucose Monitoring Suppl (CONTOUR NEXT ONE) KIT 1 kit by Does not apply route daily. To check blood sugar daily   BOTOX 100 units SOLR injection    clindamycin (CLEOCIN T) 1 % external solution Apply topically 2 (two) times daily. As needed apply to affected area of left arm   clotrimazole ([employer] CLOTRIMAZOLE) 1 % cream Apply 1 application. topically 2 (two) times daily.   COVID-19 At Home Test (COVID-19 OTC ANTIGEN 1-PACK VI) BinaxNOW  COVID-19 Ag Self Test kit  FOLLOW PACKAGE DIRECTIONS   empagliflozin (JARDIANCE) 10 MG TABS tablet Take 1 tablet (10 mg total) by mouth daily before breakfast.   escitalopram (LEXAPRO) 10 MG tablet Take 1 tablet (10 mg total) by mouth at bedtime.   fluticasone (FLONASE) 50 MCG/ACT nasal spray Place 2 sprays into both nostrils daily.   glucose blood (CONTOUR NEXT TEST) test strip To check blood sugar daily   Lancets MISC To check blood sugar daily   levothyroxine (SYNTHROID) 50 MCG tablet TAKE 1 TABLET(50 MCG) BY MOUTH DAILY   meloxicam (MOBIC) 15 MG tablet Take 1 tablet (15 mg total) by mouth daily.   montelukast (SINGULAIR) 10 MG tablet Take 1 tablet (10 mg total) by mouth daily.   omeprazole (PRILOSEC) 20 MG capsule TAKE 1 CAPSULE(20 MG) BY MOUTH DAILY IN THE MORNING ON AN EMPTY STOMACH   simvastatin (ZOCOR) 40 MG tablet Take 1 tablet (40 mg total) by mouth at bedtime.   triamcinolone cream (KENALOG) 0.1 % Apply 1 application topically 2 (two) times daily.   Dulaglutide (TRULICITY) 3 WH/6.7RF SOPN Inject 3 mg as directed once a week. (Patient not taking: Reported on 05/19/2022)   glipiZIDE (GLUCOTROL XL) 2.5 MG 24 hr tablet Take 1 tablet (2.5 mg total) by mouth daily before breakfast.   quinapril-hydrochlorothiazide (ACCURETIC) 20-25 MG tablet Take 1 tablet by mouth daily. (Patient not taking: Reported on 05/19/2022)   [DISCONTINUED] neomycin-polymyxin-hydrocortisone (CORTISPORIN) OTIC solution Place 3 drops into both ears in the morning and at bedtime. (Patient not taking: Reported on 05/19/2022)   No facility-administered medications prior to visit.    Review of Systems  Constitutional:  Negative for fatigue and fever.  Respiratory:  Negative for cough and shortness of breath.   Cardiovascular:  Negative for chest pain and leg swelling.  Gastrointestinal:  Negative for abdominal pain.  Neurological:  Negative for dizziness and headaches.       Objective    BP (!) 133/95 (BP  Location: Left Arm, Patient Position: Sitting, Cuff Size: Large)   Pulse 94   Wt 176 lb 8 oz (80.1 kg)   LMP  (LMP Unknown)   SpO2 100%   BMI 34.47 kg/m  Blood pressure (!) 133/95, pulse 94, weight 176 lb 8 oz (80.1 kg), SpO2 100 %.   Physical Exam Constitutional:      General: She is awake.     Appearance: She is well-developed.  HENT:     Head: Normocephalic.  Eyes:     Conjunctiva/sclera: Conjunctivae normal.  Cardiovascular:     Rate and Rhythm: Normal rate and regular rhythm.     Heart sounds: Normal heart sounds.  Pulmonary:     Effort: Pulmonary effort is normal.     Breath sounds: Normal breath sounds.  Skin:    General: Skin is warm.  Neurological:     Mental Status: She is alert and oriented to person, place, and time.  Psychiatric:        Attention and Perception: Attention normal.        Mood and Affect: Mood normal.        Speech: Speech normal.        Behavior: Behavior is cooperative.      Results for orders placed or performed in visit on 05/19/22  POCT HgB A1C  Result Value Ref Range   Hemoglobin A1C 12.7 (A) 4.0 - 5.6 %   HbA1c POC (<> result, manual entry)     HbA1c, POC (prediabetic range)     HbA1c, POC (controlled diabetic range)      Assessment & Plan     Problem List Items Addressed This Visit       Cardiovascular and Mediastinum   Hypertension associated with diabetes ([place])    Pt states she doesn't know if she is taking quinapril-hctz. I can see she filled this last month, but she has a typed med list from home, and the med is not on it. Pressure elevated today, but as we are unsure what we are compliant with, advised she double check her meds at home and f/u in 4 weeks      Relevant Orders   AMB Referral to Chronic Care Management Services     Endocrine   Hypothyroidism    Will check tsh/t4 and refill levothyroxine if stable      Relevant Orders   TSH + free T4   T2DM (type 2 diabetes mellitus) ([place]) - Primary     Uncontrolled. a1c today 12.7% compared to 7/23 11.8% Managing with glipizide 2.5, pt reports she only started jardiance 10 mg a week ago 2/2 cost. A referral was placed 3 mo ago to pharmacy, but pt never completed paperwork for jardiance/ozempic assistance.  Will reach out to pharmacy again for assistance. I discussed several options going forward for pt as her current regimen is not controlling her diabetes. Pt has an appt with endocrinology next month, she would prefer to wait and hear their opinion. Foot exam, uacr, optho utd On statin  On acei  F/u 3 mo       Relevant Orders   POCT HgB A1C (Completed)   AMB Referral to Chronic Care Management Services   Hyperlipidemia associated with type 2 diabetes mellitus (HCC)    LDL goal < 70 needs fasting lipids checked She had increased simvastatin 20 mg to now 40 mg       Relevant Orders   Lipid Profile   AMB Referral to Chronic Care Management Services     Other   Vitamin D deficiency    Historically will check for improvement      Relevant Orders   Vitamin D (25 hydroxy)    Pt declines colonoscopy has cologuard at home. She reports completing the sleep study ordered a while ago, but has not sent it back  Return in about 4 weeks (around 06/16/2022) for hypertension.      I, Christine Kirschner, PA-C have reviewed all documentation for this visit. The documentation on  05/19/2022 for the exam, diagnosis, procedures, and orders are all accurate and complete.  Christine Kirschner, PA-C [facility] [address] [place], [place], [address] Office: [phone] Fax: [place]

## 2022-05-24 ENCOUNTER — Encounter: Payer: Self-pay | Admitting: *Deleted

## 2022-05-25 ENCOUNTER — Telehealth: Payer: Self-pay

## 2022-05-25 NOTE — Telephone Encounter (Signed)
Copied from [place] [id number]. Topic: Referral - Question >> May 25, 2022  8:42 AM Leilani Able wrote: Reason for CRM: Pt has called and states that at last visit staff was to return her call to verify where her endocrinologist appt is, She says she was told it was on the 9th but she has no idea where to go, staff [facility] made it for her? States office is supposed to be getting back with her in re to.. please FU with pt, as unable to assist further. [phone]

## 2022-05-26 NOTE — Telephone Encounter (Signed)
Called and spoke with patient. Gave referral information. Patient also requested information be sent to her my chart account. Sent referral information to patient 's my chart.

## 2022-05-28 ENCOUNTER — Telehealth: Payer: Self-pay | Admitting: Physician Assistant

## 2022-05-28 ENCOUNTER — Other Ambulatory Visit: Payer: Self-pay | Admitting: Physician Assistant

## 2022-05-28 DIAGNOSIS — E1159 Type 2 diabetes mellitus with other circulatory complications: Secondary | ICD-10-CM

## 2022-05-28 MED ORDER — LISINOPRIL-HYDROCHLOROTHIAZIDE 20-25 MG PO TABS: 1.0000 | ORAL_TABLET | Freq: Every day | ORAL | 3 refills | Status: DC

## 2022-05-28 NOTE — Telephone Encounter (Signed)
Patient advised. Verbalized understanding 

## 2022-05-28 NOTE — Telephone Encounter (Signed)
Pt states the pharmacy told her they can never get quinapril-hydrochlorothiazide (ACCURETIC) 20-25 MG tablet, and it is indefinitely backordered. She needs a new medication that is equivalent to this. [facility] [place], [place]

## 2022-06-01 ENCOUNTER — Telehealth: Payer: Self-pay

## 2022-06-01 NOTE — Chronic Care Management (AMB) (Signed)
  Chronic Care Management   Outreach Note  06/01/2022 Name: Christine Clarke MRN: [id number] DOB: 15-Dec-1950  Christine Clarke is a 71 y.o. year old female who is a primary care patient of [name], [place]. I reached out to [name] by phone today in response to a referral sent by [name] primary care provider.  An unsuccessful telephone outreach was attempted today to contact the patient about Chronic Care Management needs.    Follow Up Plan: A HIPAA compliant phone message was left for the patient providing contact information and requesting a return call.  The care management team will reach out to the patient again over the next 7 days.  If patient returns call to provider office, please advise to call [facility] * at [phone]*  [name], [place], [place] [address] Direct Dial: [phone] [name]

## 2022-06-04 NOTE — Progress Notes (Signed)
  Chronic Care Management   Note  06/04/2022 Name: TINSLEIGH SLOVACEK MRN: [id number] DOB: 02/20/1951  CARDELIA SASSANO is a 71 y.o. year old female who is a primary care patient of [name], [place]. I reached out to [name] by phone today in response to a referral sent by [name] PCP.  Ms. agreed to scheduling an appointment with the CCM RN Case Manager and Pharm D   Patient did not agree to scheduling an appointment with the RN Case Manager and Pharm D . The ordering provider has been notified.   Follow up plan: Patient declines further follow up and engagement by the care management team. Appropriate care team members and provider have been notified via electronic communication.  [name], [place], [place] [address] Direct Dial: [phone] [email or web address]

## 2022-06-10 DIAGNOSIS — I959 Hypotension, unspecified: Secondary | ICD-10-CM | POA: Diagnosis not present

## 2022-06-10 DIAGNOSIS — E669 Obesity, unspecified: Secondary | ICD-10-CM | POA: Diagnosis not present

## 2022-06-10 DIAGNOSIS — R634 Abnormal weight loss: Secondary | ICD-10-CM | POA: Diagnosis not present

## 2022-06-10 DIAGNOSIS — E039 Hypothyroidism, unspecified: Secondary | ICD-10-CM | POA: Diagnosis not present

## 2022-06-10 DIAGNOSIS — E782 Mixed hyperlipidemia: Secondary | ICD-10-CM | POA: Diagnosis not present

## 2022-06-10 DIAGNOSIS — E1165 Type 2 diabetes mellitus with hyperglycemia: Secondary | ICD-10-CM | POA: Diagnosis not present

## 2022-06-17 DIAGNOSIS — R634 Abnormal weight loss: Secondary | ICD-10-CM | POA: Diagnosis not present

## 2022-07-12 ENCOUNTER — Other Ambulatory Visit: Payer: Self-pay | Admitting: Physician Assistant

## 2022-07-12 DIAGNOSIS — R1012 Left upper quadrant pain: Secondary | ICD-10-CM

## 2022-07-22 DIAGNOSIS — M1711 Unilateral primary osteoarthritis, right knee: Secondary | ICD-10-CM | POA: Diagnosis not present

## 2022-08-09 DIAGNOSIS — E782 Mixed hyperlipidemia: Secondary | ICD-10-CM | POA: Diagnosis not present

## 2022-08-09 DIAGNOSIS — I10 Essential (primary) hypertension: Secondary | ICD-10-CM | POA: Diagnosis not present

## 2022-08-09 DIAGNOSIS — R300 Dysuria: Secondary | ICD-10-CM | POA: Diagnosis not present

## 2022-08-09 DIAGNOSIS — E039 Hypothyroidism, unspecified: Secondary | ICD-10-CM | POA: Diagnosis not present

## 2022-08-09 DIAGNOSIS — E1165 Type 2 diabetes mellitus with hyperglycemia: Secondary | ICD-10-CM | POA: Diagnosis not present

## 2022-08-09 DIAGNOSIS — E669 Obesity, unspecified: Secondary | ICD-10-CM | POA: Diagnosis not present

## 2022-08-09 DIAGNOSIS — R4189 Other symptoms and signs involving cognitive functions and awareness: Secondary | ICD-10-CM | POA: Diagnosis not present

## 2022-08-19 ENCOUNTER — Other Ambulatory Visit: Payer: Self-pay | Admitting: Physician Assistant

## 2022-08-19 DIAGNOSIS — E034 Atrophy of thyroid (acquired): Secondary | ICD-10-CM

## 2022-08-20 NOTE — Telephone Encounter (Signed)
Requested medications are due for refill today.  yes  Requested medications are on the active medications list.  yes  Last refill. 05/03/2022 #90 0 rf  Future visit scheduled.   no  Notes to clinic.  Labs are expired.    Requested Prescriptions  Pending Prescriptions Disp Refills   levothyroxine (SYNTHROID) 50 MCG tablet [Pharmacy Med Name: LEVOTHYROXINE 0.05MG  (50MCG) TAB] 90 tablet 0    Sig: TAKE 1 TABLET(50 MCG) BY MOUTH DAILY     Endocrinology:  Hypothyroid Agents Failed - 08/19/2022  5:44 PM      Failed - TSH in normal range and within 360 days    TSH  Date Value Ref Range Status  05/22/2021 3.350 0.450 - 4.500 uIU/mL Final         Passed - Valid encounter within last 12 months    Recent Outpatient Visits           3 months ago Type 2 diabetes mellitus with hyperglycemia, without long-term current use of insulin [facility])   [facility] Thedore Mins, [place], PA-C   6 months ago Type 2 diabetes mellitus with hyperglycemia, without long-term current use of insulin [facility])   [facility] Thedore Mins, [place], PA-C   10 months ago Acute otitis externa of both ears, unspecified type   [facility] Thedore Mins, [place], PA-C   1 year ago Encounter for physical examination   [employer], [place], PA-C   1 year ago Yeast infection involving the vagina and surrounding area   [facility] Thedore Mins, [place], [place]

## 2022-08-23 ENCOUNTER — Telehealth: Payer: Self-pay

## 2022-08-23 ENCOUNTER — Other Ambulatory Visit: Payer: Self-pay | Admitting: Physician Assistant

## 2022-08-23 DIAGNOSIS — E034 Atrophy of thyroid (acquired): Secondary | ICD-10-CM

## 2022-08-23 NOTE — Telephone Encounter (Signed)
Copied from [place] [id number] [id number]. Topic: General - Other >> Aug 23, 2022  3:42 PM Oley Balm A wrote: Reason for CRM: Patient states that she is wanting to know if she could switch to Dr. Lavon Paganini and wondering if she could be her PCP.  Please advise

## 2022-08-24 NOTE — Telephone Encounter (Signed)
Patient scheduled this Thursday.   Christine Clarke

## 2022-08-24 NOTE — Telephone Encounter (Signed)
[place] with me. Reschedule next visit to be with me.

## 2022-08-25 ENCOUNTER — Ambulatory Visit: Payer: Medicare HMO | Admitting: Physician Assistant

## 2022-08-26 ENCOUNTER — Telehealth: Payer: Medicare HMO | Admitting: Family Medicine

## 2022-08-30 ENCOUNTER — Encounter: Payer: Self-pay | Admitting: Family Medicine

## 2022-08-30 ENCOUNTER — Encounter: Payer: Medicare HMO | Attending: Internal Medicine | Admitting: *Deleted

## 2022-08-30 ENCOUNTER — Encounter: Payer: Self-pay | Admitting: *Deleted

## 2022-08-30 VITALS — BP 130/84 | Ht 60.0 in | Wt 180.9 lb

## 2022-08-30 DIAGNOSIS — E119 Type 2 diabetes mellitus without complications: Secondary | ICD-10-CM | POA: Insufficient documentation

## 2022-08-30 DIAGNOSIS — Z794 Long term (current) use of insulin: Secondary | ICD-10-CM

## 2022-08-30 DIAGNOSIS — Z713 Dietary counseling and surveillance: Secondary | ICD-10-CM | POA: Insufficient documentation

## 2022-08-30 NOTE — Progress Notes (Unsigned)
Diabetes Self-Management Education  Visit Type: First/Initial  Appt. Start Time: 1500 Appt. End Time: 1620  08/30/2022  [name], identified by name and date of birth, is a 72 y.o. female with a diagnosis of Diabetes: Type 2.   ASSESSMENT  Blood pressure 130/84, height 5' (1.524 m), weight 180 lb 14.4 oz (82.1 kg). Body mass index is 35.33 kg/m.   Diabetes Self-Management Education - 08/30/22 1927       Visit Information   Visit Type First/Initial      Initial Visit   Diabetes Type Type 2    Date Diagnosed 10+ years    Are you currently following a meal plan? No    Are you taking your medications as prescribed? Yes      Health Coping   How would you rate your overall health? Fair      Psychosocial Assessment   Patient Belief/Attitude about Diabetes Other (comment)   "not good"   What is the hardest part about your diabetes right now, causing you the most concern, or is the most worrisome to you about your diabetes?   Making healty food and beverage choices;Other (comment)   sleep patterns   Self-care barriers None    Self-management support Doctor's office;Friends    Patient Concerns Nutrition/Meal planning;Medication;Glycemic Control;Monitoring;Weight Control;Healthy Lifestyle    Special Needs None    Preferred Learning Style Other (comment)   talking/discussion   Learning Readiness Ready    How often do you need to have someone help you when you read instructions, pamphlets, or other written materials from your doctor or pharmacy? 1 - Never    What is the last grade level you completed in school? 14+      Pre-Education Assessment   Patient understands the diabetes disease and treatment process. Needs Review    Patient understands incorporating nutritional management into lifestyle. Needs Instruction    Patient undertands incorporating physical activity into lifestyle. Needs Instruction    Patient understands using medications safely. Needs Review    Patient  understands monitoring blood glucose, interpreting and using results Needs Review    Patient understands prevention, detection, and treatment of acute complications. Needs Review    Patient understands prevention, detection, and treatment of chronic complications. Needs Review    Patient understands how to develop strategies to address psychosocial issues. Needs Instruction    Patient understands how to develop strategies to promote health/change behavior. Needs Instruction      Complications   Last HgB A1C per patient/outside source 12.7 %   05/19/2022   How often do you check your blood sugar? 3-4 times / week    Fasting Blood glucose range (mg/dL) >200    Postprandial Blood glucose range (mg/dL) >200    Have you had a dilated eye exam in the past 12 months? Yes    Have you had a dental exam in the past 12 months? Yes    Are you checking your feet? Yes    How many days per week are you checking your feet? 2      Dietary Intake   Breakfast 1-2 pm breakfast meal - bacon eggs and cheese; ham sandwich    Lunch 6:30 pm chicken, occasional beef and pork; potatoes, peas, beans, corn, rice, pasta, bread, green beans, broccoli, cauliflower, zucchini, carrots, lettuce cabbage    Snack (afternoon) fruit - apple orange    Dinner 10 pm - 1/2 meat sandwich    Beverage(s) water,  unsweetened tea, diet soda  Activity / Exercise   Activity / Exercise Type ADL's      Patient Education   Previous Diabetes Education No    Disease Pathophysiology Explored patient's options for treatment of their diabetes    Healthy Eating Role of diet in the treatment of diabetes and the relationship between the three main macronutrients and blood glucose level;Food label reading, portion sizes and measuring food.;Carbohydrate counting;Reviewed blood glucose goals for pre and post meals and how to evaluate the patients' food intake on their blood glucose level.;Meal timing in regards to the patients' current diabetes  medication.;Other (comment)   Provided Meal Delivery Options per her request   Being Active Role of exercise on diabetes management, blood pressure control and cardiac health.    Medications Taught/reviewed insulin/injectables, injection, site rotation, insulin/injectables storage and needle disposal.;Reviewed patients medication for diabetes, action, purpose, timing of dose and side effects.    Monitoring Purpose and frequency of SMBG.;Taught/discussed recording of test results and interpretation of SMBG.;Identified appropriate SMBG and/or A1C goals.    Acute complications Taught prevention, symptoms, and  treatment of hypoglycemia - the 15 rule.    Chronic complications Relationship between chronic complications and blood glucose control    Diabetes Stress and Support Identified and addressed patients feelings and concerns about diabetes      Individualized Goals (developed by patient)   Reducing Risk Other (comment)   improve blood sugars, decrease medications, prevent diabetes complications, lose weight, lead a healthier lifestyle, become more fit     Outcomes   Expected Outcomes Demonstrated interest in learning. Expect positive outcomes         Individualized Plan for Diabetes Self-Management Training:   Learning Objective:  Patient will have a greater understanding of diabetes self-management. Patient education plan is to attend individual and/or group sessions per assessed needs and concerns.   Plan:   Patient Instructions  Check blood sugars 1 x day before breakfast or 2 hrs after one meal every day Bring blood sugar records to the next class  Exercise:  Begin walking 10 minutes 3 x week and gradually increase to 30 minutes 5 x week  Eat 3 meals day,   1-2  snacks a day Space meals 4-6 hours apart Be consistent with meal times Don't skip meals - eat at least 1 protein and 1 carbohydrate serving  Carry fast acting glucose and a snack at all times Rotate injection  sites  Return for classes on:    Expected Outcomes:  Demonstrated interest in learning. Expect positive outcomes  Education material provided:  General Meal Planning Guidelines Simple Meal Plan Meal Delivery Options Glucose tablets Symptoms, causes and treatments of Hypoglycemia  If problems or questions, patient to contact team via:   Johny Drilling, RN, [facility] [phone]  Future DSME appointment:  September 09, 2022 for Diabetes Class 1

## 2022-08-30 NOTE — Patient Instructions (Signed)
Check blood sugars 1 x day before breakfast or 2 hrs after one meal every day Bring blood sugar records to the next class  Exercise:  Begin walking 10 minutes 3 x week and gradually increase to 30 minutes 5 x week  Eat 3 meals day,   1-2  snacks a day Space meals 4-6 hours apart Be consistent with meal times Don't skip meals - eat at least 1 protein and 1 carbohydrate serving  Carry fast acting glucose and a snack at all times Rotate injection sites  Return for classes on:

## 2022-08-31 ENCOUNTER — Ambulatory Visit (INDEPENDENT_AMBULATORY_CARE_PROVIDER_SITE_OTHER): Payer: Medicare HMO | Admitting: Family Medicine

## 2022-08-31 ENCOUNTER — Encounter: Payer: Self-pay | Admitting: Family Medicine

## 2022-08-31 VITALS — BP 138/85 | HR 77 | Temp 98.7°F | Resp 16 | Wt 179.6 lb

## 2022-08-31 DIAGNOSIS — J0140 Acute pansinusitis, unspecified: Secondary | ICD-10-CM

## 2022-08-31 DIAGNOSIS — R079 Chest pain, unspecified: Secondary | ICD-10-CM | POA: Diagnosis not present

## 2022-08-31 MED ORDER — AMOXICILLIN-POT CLAVULANATE 875-125 MG PO TABS: 1.0000 | ORAL_TABLET | Freq: Two times a day (BID) | ORAL | 0 refills | Status: AC

## 2022-08-31 NOTE — Progress Notes (Signed)
I,Sulibeya S Dimas,acting as a scribe for Lavon Paganini, MD.,have documented all relevant documentation on the behalf of Lavon Paganini, MD,as directed by  Lavon Paganini, MD while in the presence of Lavon Paganini, MD.     Established patient visit   Patient: Christine Clarke   DOB: 06-Sep-1950   72 y.o. Female  MRN: [id number] Visit Date: 08/31/2022  Today's healthcare provider: Lavon Paganini, MD   Chief Complaint  Patient presents with   URI   Subjective    URI  This is a recurrent problem. The current episode started 1 to 4 weeks ago. The problem has been unchanged. Associated symptoms include congestion, coughing, ear pain, rhinorrhea, sinus pain, sneezing and a sore throat. Pertinent negatives include no chest pain or nausea. She has tried nothing for the symptoms.  ear pain, sore throat from drainage. Ongoing for several weeks. No fever.  Patient reports she fell a couple weeks ago in December. She reports she is having pain in upper chest. She is not sure if the pain is from the fall or from the cold symptoms.  Was getting in the tub and hand slipped. She hit her L flank on the tub.  Didn't have this chest pain at that time. Started having chest pain across mid chest. Better now, but was bothersome in the last week or so - positional in nature.  Reports there is something Rx'd by ENT that she uses intermittent ear pain, but she is not sure - ?maybe mometasone.  Medications: Outpatient Medications Prior to Visit  Medication Sig   B-D UF III MINI PEN NEEDLES 31G X 5 MM MISC daily. as directed   Blood Glucose Monitoring Suppl (CONTOUR NEXT ONE) KIT 1 kit by Does not apply route daily. To check blood sugar daily   BOTOX 100 units SOLR injection    COVID-19 At Home Test (COVID-19 OTC ANTIGEN 1-PACK VI) BinaxNOW COVID-19 Ag Self Test kit  FOLLOW PACKAGE DIRECTIONS   escitalopram (LEXAPRO) 10 MG tablet Take 1 tablet (10 mg total) by mouth at bedtime.   fluticasone  (FLONASE) 50 MCG/ACT nasal spray Place 2 sprays into both nostrils daily.   glucose blood (CONTOUR NEXT TEST) test strip To check blood sugar daily   Lancets MISC To check blood sugar daily   levothyroxine (SYNTHROID) 50 MCG tablet TAKE 1 TABLET(50 MCG) BY MOUTH DAILY   meloxicam (MOBIC) 15 MG tablet Take 1 tablet (15 mg total) by mouth daily.   mometasone (ELOCON) 0.1 % lotion APPLY 2 TO 4 DROPS TO EACH EAR EVERY NIGHT AT BEDTIME FOR 5 DAYS THEN AS NEEDED FOR DRY AND ITCHY SKIN AS DIRECTED   montelukast (SINGULAIR) 10 MG tablet Take 1 tablet (10 mg total) by mouth daily.   omeprazole (PRILOSEC) 20 MG capsule TAKE 1 CAPSULE(20 MG) BY MOUTH DAILY IN THE MORNING ON AN EMPTY STOMACH   simvastatin (ZOCOR) 40 MG tablet Take 1 tablet (40 mg total) by mouth at bedtime.   TRESIBA FLEXTOUCH 100 UNIT/ML FlexTouch Pen Inject 30 Units into the skin daily.   No facility-administered medications prior to visit.    Review of Systems  Constitutional:  Negative for chills and fever.  HENT:  Positive for congestion, ear pain, rhinorrhea, sinus pain, sneezing and sore throat.   Respiratory:  Positive for cough.   Cardiovascular:  Negative for chest pain.  Gastrointestinal:  Negative for abdominal distention and nausea.        Objective    BP 138/85 (BP Location: Left  Arm, Patient Position: Sitting, Cuff Size: Large)   Pulse 77   Temp 98.7 F (37.1 C) (Temporal)   Resp 16   Wt 179 lb 9.6 oz (81.5 kg)   LMP  (LMP Unknown)   SpO2 97%   BMI 35.08 kg/m  BP Readings from Last 3 Encounters:  08/31/22 138/85  08/30/22 130/84  05/19/22 (!) 133/95   Wt Readings from Last 3 Encounters:  08/31/22 179 lb 9.6 oz (81.5 kg)  08/30/22 180 lb 14.4 oz (82.1 kg)  05/19/22 176 lb 8 oz (80.1 kg)     Physical Exam Vitals reviewed.  Constitutional:      General: She is not in acute distress.    Appearance: Normal appearance. She is well-developed. She is not diaphoretic.  HENT:     Head: Normocephalic and  atraumatic.     Right Ear: Tympanic membrane, ear canal and external ear normal.     Left Ear: Tympanic membrane, ear canal and external ear normal.     Nose: Nose normal.     Mouth/Throat:     Mouth: Mucous membranes are moist.     Pharynx: Oropharynx is clear. No oropharyngeal exudate.  Eyes:     General: No scleral icterus.    Conjunctiva/sclera: Conjunctivae normal.     Pupils: Pupils are equal, round, and reactive to light.  Neck:     Thyroid: No thyromegaly.  Cardiovascular:     Rate and Rhythm: Normal rate and regular rhythm.     Pulses: Normal pulses.     Heart sounds: Normal heart sounds. No murmur heard. Pulmonary:     Effort: Pulmonary effort is normal. No respiratory distress.     Breath sounds: Normal breath sounds. No wheezing or rales.  Musculoskeletal:     Cervical back: Neck supple.     Right lower leg: No edema.     Left lower leg: No edema.  Lymphadenopathy:     Cervical: No cervical adenopathy.  Skin:    General: Skin is warm and dry.     Findings: No rash.  Neurological:     Mental Status: She is alert.       No results found for any visits on 08/31/22.  Assessment & Plan     1. Acute non-recurrent pansinusitis - symptoms and exam c/w sinusitis   - no evidence of AOM, CAP, strep pharyngitis, or other infection - given duration of symptoms, suspect bacterial etiology - will treat with augmentin x7d - discussed symptomatic management (flonase, decongestants, etc), natural course, and return precautions    2. Chest pain, unspecified type - suspect MSK related to coughing - getting better and positional in nature, has TTP of chest wall on exam - discussed red flags/return precautions    Meds ordered this encounter  Medications   amoxicillin-clavulanate (AUGMENTIN) 875-125 MG tablet    Sig: Take 1 tablet by mouth 2 (two) times daily for 7 days.    Dispense:  14 tablet    Refill:  0     Return in about 6 months (around 03/01/2023) for CPE, AWV.       I, Lavon Paganini, MD, have reviewed all documentation for this visit. The documentation on 08/31/22 for the exam, diagnosis, procedures, and orders are all accurate and complete.   Rebekka Lobello, Dionne Bucy, MD, MPH [facility]

## 2022-08-31 NOTE — Patient Instructions (Signed)
Ask at the pharmacy about RSV and tetanus vaccines

## 2022-09-01 ENCOUNTER — Telehealth: Payer: Self-pay | Admitting: Family Medicine

## 2022-09-01 DIAGNOSIS — J301 Allergic rhinitis due to pollen: Secondary | ICD-10-CM

## 2022-09-01 NOTE — Telephone Encounter (Signed)
[facility] faxed refill request for the following medications:    montelukast (SINGULAIR) 10 MG tablet   Please advise

## 2022-09-02 MED ORDER — MONTELUKAST SODIUM 10 MG PO TABS: 10.0000 mg | ORAL_TABLET | Freq: Every day | ORAL | 3 refills | Status: DC

## 2022-09-08 ENCOUNTER — Ambulatory Visit (INDEPENDENT_AMBULATORY_CARE_PROVIDER_SITE_OTHER): Payer: Medicare HMO

## 2022-09-08 VITALS — Ht 60.0 in | Wt 179.0 lb

## 2022-09-08 DIAGNOSIS — Z0000 Encounter for general adult medical examination without abnormal findings: Secondary | ICD-10-CM | POA: Diagnosis not present

## 2022-09-08 NOTE — Progress Notes (Signed)
Virtual Visit via Telephone Note  I connected with  Christine Clarke on 09/08/22 at  3:30 PM EST by telephone and verified that I am speaking with the correct person using two identifiers.  Location: Patient: home Provider: [facility] Persons participating in the virtual visit: patient/Nurse Health Advisor   I discussed the limitations, risks, security and privacy concerns of performing an evaluation and management service by telephone and the availability of in person appointments. The patient expressed understanding and agreed to proceed.  Interactive audio and video telecommunications were attempted between this nurse and patient, however failed, due to patient having technical difficulties OR patient did not have access to video capability.  We continued and completed visit with audio only.  Some vital signs may be absent or patient reported.   Roger Shelter, LPN  Subjective:   Christine Clarke is a 72 y.o. female who presents for Medicare Annual (Subsequent) preventive examination.  Review of Systems    Cardiac Risk Factors include: advanced age (>20men, >2 women);diabetes mellitus;dyslipidemia;hypertension;obesity (BMI >30kg/m2)    Objective:    Today's Vitals   09/08/22 1520 09/08/22 1521  Weight: 179 lb (81.2 kg)   Height: 5' (1.524 m)   PainSc:  5    Body mass index is 34.96 kg/m.     09/08/2022    3:34 PM 08/30/2022    3:13 PM 09/07/2021    3:13 PM 11/18/2015    3:03 PM  Advanced Directives  Does Patient Have a Medical Advance Directive? No No No No  Would patient like information on creating a medical advance directive? Yes (ED - Information included in AVS) Yes (MAU/Ambulatory/Procedural Areas - Information given) No - Patient declined     Current Medications (verified) Outpatient Encounter Medications as of 09/08/2022  Medication Sig   B-D UF III MINI PEN NEEDLES 31G X 5 MM MISC daily. as directed   Blood Glucose Monitoring Suppl (CONTOUR NEXT ONE) KIT 1 kit by  Does not apply route daily. To check blood sugar daily   BOTOX 100 units SOLR injection    COVID-19 At Home Test (COVID-19 OTC ANTIGEN 1-PACK VI) BinaxNOW COVID-19 Ag Self Test kit  FOLLOW PACKAGE DIRECTIONS   escitalopram (LEXAPRO) 10 MG tablet Take 1 tablet (10 mg total) by mouth at bedtime.   fluticasone (FLONASE) 50 MCG/ACT nasal spray Place 2 sprays into both nostrils daily.   glucose blood (CONTOUR NEXT TEST) test strip To check blood sugar daily   Lancets MISC To check blood sugar daily   levothyroxine (SYNTHROID) 50 MCG tablet TAKE 1 TABLET(50 MCG) BY MOUTH DAILY   meloxicam (MOBIC) 15 MG tablet Take 1 tablet (15 mg total) by mouth daily.   mometasone (ELOCON) 0.1 % lotion APPLY 2 TO 4 DROPS TO EACH EAR EVERY NIGHT AT BEDTIME FOR 5 DAYS THEN AS NEEDED FOR DRY AND ITCHY SKIN AS DIRECTED   montelukast (SINGULAIR) 10 MG tablet Take 1 tablet (10 mg total) by mouth daily.   omeprazole (PRILOSEC) 20 MG capsule TAKE 1 CAPSULE(20 MG) BY MOUTH DAILY IN THE MORNING ON AN EMPTY STOMACH   simvastatin (ZOCOR) 40 MG tablet Take 1 tablet (40 mg total) by mouth at bedtime.   TRESIBA FLEXTOUCH 100 UNIT/ML FlexTouch Pen Inject 30 Units into the skin daily.   No facility-administered encounter medications on file as of 09/08/2022.    Allergies (verified) Sulfa antibiotics and Metformin and related   History: Past Medical History:  Diagnosis Date   Allergy    Diabetes  mellitus without complication (HCC)    Hyperlipidemia    Hypertension    Past Surgical History:  Procedure Laterality Date   ABDOMINAL HYSTERECTOMY  1981   partial   BLADDER SURGERY  1981   BREAST EXCISIONAL BIOPSY Left 1998   Family History  Problem Relation Age of Onset   Hypertension Mother    Breast cancer Neg Hx    Social History   Socioeconomic History   Marital status: Divorced    Spouse name: Not on file   Number of children: Not on file   Years of education: Not on file   Highest education level: Not on file   Occupational History   Not on file  Tobacco Use   Smoking status: Never   Smokeless tobacco: Never  Vaping Use   Vaping Use: Never used  Substance and Sexual Activity   Alcohol use: Yes    Alcohol/week: 1.0 standard drink of alcohol    Types: 1 Shots of liquor per week    Comment: occasional   Drug use: No   Sexual activity: Not on file  Other Topics Concern   Not on file  Social History Narrative   Not on file   Social Determinants of Health   Financial Resource Strain: Low Risk  (09/08/2022)   Overall Financial Resource Strain (CARDIA)    Difficulty of Paying Living Expenses: Not hard at all  Food Insecurity: No Food Insecurity (09/08/2022)   Hunger Vital Sign    Worried About Running Out of Food in the Last Year: Never true    Ran Out of Food in the Last Year: Never true  Transportation Needs: No Transportation Needs (09/08/2022)   PRAPARE - Administrator, Civil Service (Medical): No    Lack of Transportation (Non-Medical): No  Physical Activity: Inactive (09/08/2022)   Exercise Vital Sign    Days of Exercise per Week: 0 days    Minutes of Exercise per Session: 0 min  Stress: Stress Concern Present (09/08/2022)   [employer] of Occupational Health - Occupational Stress Questionnaire    Feeling of Stress : To some extent  Social Connections: Socially Isolated (09/08/2022)   Social Connection and Isolation Panel [NHANES]    Frequency of Communication with Friends and Family: More than three times a week    Frequency of Social Gatherings with Friends and Family: Once a week    Attends Religious Services: Never    Database administrator or Organizations: No    Attends Banker Meetings: Never    Marital Status: Widowed    Tobacco Counseling Counseling given: Not Answered   Clinical Intake:  Pre-visit preparation completed: Yes  Pain : 0-10 Pain Score: 5  Pain Type: Acute pain Pain Location: Nose (sinal pain;ABT complete yesterday) Pain  Descriptors / Indicators: Aching, Pressure Pain Onset: 1 to 4 weeks ago Pain Frequency: Constant Pain Relieving Factors: warm compress, medication  Pain Relieving Factors: warm compress, medication  BMI - recorded: 34.96 Nutritional Risks: None Diabetes: Yes CBG done?: No Did pt. bring in CBG monitor from home?: No  How often do you need to have someone help you when you read instructions, pamphlets, or other written materials from your doctor or pharmacy?: 1 - Never  Diabetic?yes  Interpreter Needed?: No  Information entered by :: B.Dathan Attia,LPN   Activities of Daily Living    09/08/2022    3:34 PM 08/31/2022   10:53 AM  In your present state of health, do you have  any difficulty performing the following activities:  Hearing? 1 1  Vision? 0 0  Difficulty concentrating or making decisions? 1 0  Comment a little forgetful   Walking or climbing stairs? 0 0  Dressing or bathing? 0 0  Doing errands, shopping? 0 0  Preparing Food and eating ? N   Using the Toilet? N   In the past six months, have you accidently leaked urine? N   Do you have problems with loss of bowel control? N   Managing your Medications? N   Managing your Finances? N   Housekeeping or managing your Housekeeping? N     Patient Care Team: Erasmo Downer, MD as PCP - General (Family Medicine)  Indicate any recent Medical Services you may have received from other than [facility] providers in the past year (date may be approximate).     Assessment:   This is a routine wellness examination for Christine Clarke.  Hearing/Vision screen Hearing Screening - Comments:: Adequate hearing with hearing aids Vision Screening - Comments:: Vision fair with glasses.Dr Pershing Cox  Dietary issues and exercise activities discussed: Current Exercise Habits: The patient does not participate in regular exercise at present, Exercise limited by: Other - see comments (lack of motivation and time constraints;pt says she is working on  a exercise routine to start)   Goals Addressed             This Visit's Progress    DIET - EAT MORE FRUITS AND VEGETABLES   Not on track      Depression Screen    09/08/2022    3:29 PM 08/31/2022   10:52 AM 08/30/2022    3:15 PM 09/07/2021    3:10 PM 06/19/2021    1:22 PM 10/08/2020    3:05 PM 05/21/2020    7:38 AM  PHQ 2/9 Scores  PHQ - 2 Score 2 1 1 1 2 4 3   PHQ- 9 Score 5 7   11 14 11     Fall Risk    09/08/2022    3:26 PM 08/31/2022   10:52 AM 08/30/2022    3:14 PM 09/07/2021    3:13 PM 06/19/2021    1:22 PM  Fall Risk   Falls in the past year? 1 1 1  0 1  Number falls in past yr: 0 1 0 0 0  Injury with Fall? 1 1 1  0 0  Comment   "cracked shoulder - no operation"    Risk for fall due to : No Fall Risks History of fall(s) History of fall(s) No Fall Risks   Follow up Education provided;Falls prevention discussed Falls evaluation completed;Education provided;Falls prevention discussed  Falls prevention discussed     FALL RISK PREVENTION PERTAINING TO THE HOME:  Any stairs in or around the home? Yes  If so, are there any without handrails? Yes  Home free of loose throw rugs in walkways, pet beds, electrical cords, etc? Yes  Adequate lighting in your home to reduce risk of falls? Yes   ASSISTIVE DEVICES UTILIZED TO PREVENT FALLS:  Life alert? No  Use of a cane, walker or w/c? No  Grab bars in the bathroom? No  Shower chair or bench in shower? No  Elevated toilet seat or a handicapped toilet? No   Cognitive Function:        09/08/2022    3:41 PM  6CIT Screen  What Year? 0 points  What month? 0 points  What time? 0 points  Count back from 20 0  points  Months in reverse 0 points  Repeat phrase 0 points  Total Score 0 points    Immunizations Immunization History  Administered Date(s) Administered   Fluad Quad(high Dose 65+) 04/24/2019, 05/21/2020, 05/22/2021, 05/19/2022   PFIZER Comirnaty(Gray Top)Covid-19 Tri-Sucrose Vaccine 10/10/2019, 07/18/2020    Pneumococcal Conjugate-13 06/20/2014   Pneumococcal Polysaccharide-23 11/18/2015   Tdap 01/28/2012   Zoster Recombinat (Shingrix) 04/24/2019, 06/26/2019   Zoster, Live 01/28/2012    TDAP status: Up to date  Flu Vaccine status: Up to date  Pneumococcal vaccine status: Up to date  Covid-19 vaccine status: Completed vaccines  Qualifies for Shingles Vaccine? Yes   Zostavax completed No   Shingrix Completed?: No.    Education has been provided regarding the importance of this vaccine. Patient has been advised to call insurance company to determine out of pocket expense if they have not yet received this vaccine. Advised may also receive vaccine at local pharmacy or Health Dept. Verbalized acceptance and understanding.  Screening Tests Health Maintenance  Topic Date Due   DTaP/Tdap/Td (2 - Td or Tdap) 01/27/2022   COVID-19 Vaccine (3 - 2023-24 season) 04/02/2022   FOOT EXAM  06/19/2022   COLONOSCOPY (Pts 45-7yrs Insurance coverage will need to be confirmed)  05/20/2023 (Originally 09/28/2020)   OPHTHALMOLOGY EXAM  10/02/2022   HEMOGLOBIN A1C  11/18/2022   Diabetic kidney evaluation - eGFR measurement  02/13/2023   Diabetic kidney evaluation - Urine ACR  02/18/2023   MAMMOGRAM  04/15/2023   Medicare Annual Wellness (AWV)  09/09/2023   Pneumonia Vaccine 30+ Years old  Completed   INFLUENZA VACCINE  Completed   DEXA SCAN  Completed   Hepatitis C Screening  Completed   Zoster Vaccines- Shingrix  Completed   HPV VACCINES  Aged Out    Health Maintenance  Health Maintenance Due  Topic Date Due   DTaP/Tdap/Td (2 - Td or Tdap) 01/27/2022   COVID-19 Vaccine (3 - 2023-24 season) 04/02/2022   FOOT EXAM  06/19/2022    Colorectal cancer screening: Type of screening: Cologuard. Completed no. Repeat every 3 years will do in the month  Mammogram status: Completed yes. Repeat every year  Bone Density status: Completed yes. Results reflect: Bone density results: NORMAL. Repeat every 5  years. 5 Lung Cancer Screening: (Low Dose CT Chest recommended if Age 22-80 years, 30 pack-year currently smoking OR have quit w/in 15years.) does not qualify.   Lung Cancer Screening Referral: no  Additional Screening:  Hepatitis C Screening: does not qualify; Completed no  Vision Screening: Recommended annual ophthalmology exams for early detection of glaucoma and other disorders of the eye. Is the patient up to date with their annual eye exam?  Yes  Who is the provider or what is the name of the office in which the patient attends annual eye exams? [place] If pt is not established with a provider, would they like to be referred to a provider to establish care? No .   Dental Screening: Recommended annual dental exams for proper oral hygiene  Community Resource Referral / Chronic Care Management: CRR required this visit?  No   CCM required this visit?  No      Plan:     I have personally reviewed and noted the following in the patient's chart:   Medical and social history Use of alcohol, tobacco or illicit drugs  Current medications and supplements including opioid prescriptions. Patient is not currently taking opioid prescriptions. Functional ability and status Nutritional status Physical activity Advanced directives  List of other physicians Hospitalizations, surgeries, and ER visits in previous 12 months Vitals Screenings to include cognitive, depression, and falls Referrals and appointments  In addition, I have reviewed and discussed with patient certain preventive protocols, quality metrics, and best practice recommendations. A written personalized care plan for preventive services as well as general preventive health recommendations were provided to patient.     Roger Shelter, LPN   02/02/813   Nurse Notes: pt states she is doing good other than trying to get her blood sugars lower. She states she is currently attending DM nutrition classes at [facility].

## 2022-09-08 NOTE — Patient Instructions (Signed)
[name] , Thank you for taking time to come for your Medicare Wellness Visit. I appreciate your ongoing commitment to your health goals. Please review the following plan we discussed and let me know if I can assist you in the future.   These are the goals we discussed:  Goals      DIET - EAT MORE FRUITS AND VEGETABLES        This is a list of the screening recommended for you and due dates:  Health Maintenance  Topic Date Due   DTaP/Tdap/Td vaccine (2 - Td or Tdap) 01/27/2022   COVID-19 Vaccine (3 - 2023-24 season) 04/02/2022   Complete foot exam   06/19/2022   Colon Cancer Screening  05/20/2023*   Eye exam for diabetics  10/02/2022   Hemoglobin A1C  11/18/2022   Yearly kidney function blood test for diabetes  02/13/2023   Yearly kidney health urinalysis for diabetes  02/18/2023   Mammogram  04/15/2023   Medicare Annual Wellness Visit  09/09/2023   Pneumonia Vaccine  Completed   Flu Shot  Completed   DEXA scan (bone density measurement)  Completed   Hepatitis C Screening: USPSTF Recommendation to screen - Ages 2-79 yo.  Completed   Zoster (Shingles) Vaccine  Completed   HPV Vaccine  Aged Out  *Topic was postponed. The date shown is not the original due date.    Advanced directives: no, pt says she has the paperwork to complete  Conditions/risks identified: none  Next appointment: Follow up in one year for your annual wellness visit 09/14/2023@ 3pm/telephone   Preventive Care 65 Years and Older, Female Preventive care refers to lifestyle choices and visits with your health care provider that can promote health and wellness. What does preventive care include? A yearly physical exam. This is also called an annual well check. Dental exams once or twice a year. Routine eye exams. Ask your health care provider how often you should have your eyes checked. Personal lifestyle choices, including: Daily care of your teeth and gums. Regular physical activity. Eating a healthy  diet. Avoiding tobacco and drug use. Limiting alcohol use. Practicing safe sex. Taking low-dose aspirin every day. Taking vitamin and mineral supplements as recommended by your health care provider. What happens during an annual well check? The services and screenings done by your health care provider during your annual well check will depend on your age, overall health, lifestyle risk factors, and family history of disease. Counseling  Your health care provider may ask you questions about your: Alcohol use. Tobacco use. Drug use. Emotional well-being. Home and relationship well-being. Sexual activity. Eating habits. History of falls. Memory and ability to understand (cognition). Work and work Statistician. Reproductive health. Screening  You may have the following tests or measurements: Height, weight, and BMI. Blood pressure. Lipid and cholesterol levels. These may be checked every 5 years, or more frequently if you are over 35 years old. Skin check. Lung cancer screening. You may have this screening every year starting at age 30 if you have a 30-pack-year history of smoking and currently smoke or have quit within the past 15 years. Fecal occult blood test (FOBT) of the stool. You may have this test every year starting at age 41. Flexible sigmoidoscopy or colonoscopy. You may have a sigmoidoscopy every 5 years or a colonoscopy every 10 years starting at age 70. Hepatitis C blood test. Hepatitis B blood test. Sexually transmitted disease (STD) testing. Diabetes screening. This is done by checking your blood  sugar (glucose) after you have not eaten for a while (fasting). You may have this done every 1-3 years. Bone density scan. This is done to screen for osteoporosis. You may have this done starting at age 20. Mammogram. This may be done every 1-2 years. Talk to your health care provider about how often you should have regular mammograms. Talk with your health care provider about  your test results, treatment options, and if necessary, the need for more tests. Vaccines  Your health care provider may recommend certain vaccines, such as: Influenza vaccine. This is recommended every year. Tetanus, diphtheria, and acellular pertussis (Tdap, Td) vaccine. You may need a Td booster every 10 years. Zoster vaccine. You may need this after age 76. Pneumococcal 13-valent conjugate (PCV13) vaccine. One dose is recommended after age 74. Pneumococcal polysaccharide (PPSV23) vaccine. One dose is recommended after age 54. Talk to your health care provider about which screenings and vaccines you need and how often you need them. This information is not intended to replace advice given to you by your health care provider. Make sure you discuss any questions you have with your health care provider. Document Released: 08/15/2015 Document Revised: 04/07/2016 Document Reviewed: 05/20/2015 Elsevier Interactive Patient Education  2017 [place] Prevention in the Home Falls can cause injuries. They can happen to people of all ages. There are many things you can do to make your home safe and to help prevent falls. What can I do on the outside of my home? Regularly fix the edges of walkways and driveways and fix any cracks. Remove anything that might make you trip as you walk through a door, such as a raised step or threshold. Trim any bushes or trees on the path to your home. Use bright outdoor lighting. Clear any walking paths of anything that might make someone trip, such as rocks or tools. Regularly check to see if handrails are loose or broken. Make sure that both sides of any steps have handrails. Any raised decks and porches should have guardrails on the edges. Have any leaves, snow, or ice cleared regularly. Use sand or salt on walking paths during winter. Clean up any spills in your garage right away. This includes oil or grease spills. What can I do in the bathroom? Use  night lights. Install grab bars by the toilet and in the tub and shower. Do not use towel bars as grab bars. Use non-skid mats or decals in the tub or shower. If you need to sit down in the shower, use a plastic, non-slip stool. Keep the floor dry. Clean up any water that spills on the floor as soon as it happens. Remove soap buildup in the tub or shower regularly. Attach bath mats securely with double-sided non-slip rug tape. Do not have throw rugs and other things on the floor that can make you trip. What can I do in the bedroom? Use night lights. Make sure that you have a light by your bed that is easy to reach. Do not use any sheets or blankets that are too big for your bed. They should not hang down onto the floor. Have a firm chair that has side arms. You can use this for support while you get dressed. Do not have throw rugs and other things on the floor that can make you trip. What can I do in the kitchen? Clean up any spills right away. Avoid walking on wet floors. Keep items that you use a lot in easy-to-reach  places. If you need to reach something above you, use a strong step stool that has a grab bar. Keep electrical cords out of the way. Do not use floor polish or wax that makes floors slippery. If you must use wax, use non-skid floor wax. Do not have throw rugs and other things on the floor that can make you trip. What can I do with my stairs? Do not leave any items on the stairs. Make sure that there are handrails on both sides of the stairs and use them. Fix handrails that are broken or loose. Make sure that handrails are as long as the stairways. Check any carpeting to make sure that it is firmly attached to the stairs. Fix any carpet that is loose or worn. Avoid having throw rugs at the top or bottom of the stairs. If you do have throw rugs, attach them to the floor with carpet tape. Make sure that you have a light switch at the top of the stairs and the bottom of the  stairs. If you do not have them, ask someone to add them for you. What else can I do to help prevent falls? Wear shoes that: Do not have high heels. Have rubber bottoms. Are comfortable and fit you well. Are closed at the toe. Do not wear sandals. If you use a stepladder: Make sure that it is fully opened. Do not climb a closed stepladder. Make sure that both sides of the stepladder are locked into place. Ask someone to hold it for you, if possible. Clearly mark and make sure that you can see: Any grab bars or handrails. First and last steps. Where the edge of each step is. Use tools that help you move around (mobility aids) if they are needed. These include: Canes. Walkers. Scooters. Crutches. Turn on the lights when you go into a dark area. Replace any light bulbs as soon as they burn out. Set up your furniture so you have a clear path. Avoid moving your furniture around. If any of your floors are uneven, fix them. If there are any pets around you, be aware of where they are. Review your medicines with your doctor. Some medicines can make you feel dizzy. This can increase your chance of falling. Ask your doctor what other things that you can do to help prevent falls. This information is not intended to replace advice given to you by your health care provider. Make sure you discuss any questions you have with your health care provider. Document Released: 05/15/2009 Document Revised: 12/25/2015 Document Reviewed: 08/23/2014 Elsevier Interactive Patient Education  2017 [employer].

## 2022-09-09 ENCOUNTER — Encounter: Payer: Self-pay | Admitting: Dietician

## 2022-09-09 ENCOUNTER — Encounter: Payer: Medicare HMO | Attending: Internal Medicine | Admitting: Dietician

## 2022-09-09 VITALS — Ht 60.0 in | Wt 179.3 lb

## 2022-09-09 DIAGNOSIS — E1165 Type 2 diabetes mellitus with hyperglycemia: Secondary | ICD-10-CM | POA: Insufficient documentation

## 2022-09-09 DIAGNOSIS — Z794 Long term (current) use of insulin: Secondary | ICD-10-CM | POA: Diagnosis not present

## 2022-09-09 NOTE — Progress Notes (Signed)

## 2022-09-15 DIAGNOSIS — E1165 Type 2 diabetes mellitus with hyperglycemia: Secondary | ICD-10-CM | POA: Diagnosis not present

## 2022-09-16 ENCOUNTER — Encounter: Payer: Medicare HMO | Admitting: *Deleted

## 2022-09-16 ENCOUNTER — Encounter: Payer: Self-pay | Admitting: *Deleted

## 2022-09-16 VITALS — Wt 179.5 lb

## 2022-09-16 DIAGNOSIS — Z794 Long term (current) use of insulin: Secondary | ICD-10-CM | POA: Diagnosis not present

## 2022-09-16 DIAGNOSIS — E1165 Type 2 diabetes mellitus with hyperglycemia: Secondary | ICD-10-CM | POA: Diagnosis not present

## 2022-09-16 NOTE — Progress Notes (Signed)

## 2022-09-23 ENCOUNTER — Encounter: Payer: Medicare HMO | Admitting: *Deleted

## 2022-09-23 ENCOUNTER — Encounter: Payer: Self-pay | Admitting: *Deleted

## 2022-09-23 VITALS — BP 160/96 | Wt 178.1 lb

## 2022-09-23 DIAGNOSIS — E1165 Type 2 diabetes mellitus with hyperglycemia: Secondary | ICD-10-CM | POA: Diagnosis not present

## 2022-09-23 DIAGNOSIS — Z794 Long term (current) use of insulin: Secondary | ICD-10-CM | POA: Diagnosis not present

## 2022-09-23 NOTE — Progress Notes (Signed)

## 2022-09-28 ENCOUNTER — Encounter: Payer: Medicare HMO | Admitting: *Deleted

## 2022-09-28 DIAGNOSIS — E1165 Type 2 diabetes mellitus with hyperglycemia: Secondary | ICD-10-CM

## 2022-09-28 DIAGNOSIS — Z794 Long term (current) use of insulin: Secondary | ICD-10-CM | POA: Diagnosis not present

## 2022-09-28 NOTE — Progress Notes (Signed)
Pt requested assistance with Dexcom G7. Instructed on use and this was applied to left arm without difficulty. Attempted to put app on phone but she couldn't download. She is using the receiver.

## 2022-09-30 ENCOUNTER — Encounter: Payer: Self-pay | Admitting: *Deleted

## 2022-10-21 DIAGNOSIS — Z0100 Encounter for examination of eyes and vision without abnormal findings: Secondary | ICD-10-CM | POA: Diagnosis not present

## 2022-10-21 DIAGNOSIS — D3131 Benign neoplasm of right choroid: Secondary | ICD-10-CM | POA: Diagnosis not present

## 2022-10-21 DIAGNOSIS — H2513 Age-related nuclear cataract, bilateral: Secondary | ICD-10-CM | POA: Diagnosis not present

## 2022-10-21 DIAGNOSIS — E119 Type 2 diabetes mellitus without complications: Secondary | ICD-10-CM | POA: Diagnosis not present

## 2022-11-09 DIAGNOSIS — E039 Hypothyroidism, unspecified: Secondary | ICD-10-CM | POA: Diagnosis not present

## 2022-11-09 DIAGNOSIS — E1165 Type 2 diabetes mellitus with hyperglycemia: Secondary | ICD-10-CM | POA: Diagnosis not present

## 2022-11-09 DIAGNOSIS — R4189 Other symptoms and signs involving cognitive functions and awareness: Secondary | ICD-10-CM | POA: Diagnosis not present

## 2022-11-09 DIAGNOSIS — E782 Mixed hyperlipidemia: Secondary | ICD-10-CM | POA: Diagnosis not present

## 2022-11-09 DIAGNOSIS — I10 Essential (primary) hypertension: Secondary | ICD-10-CM | POA: Diagnosis not present

## 2022-11-09 LAB — LAB REPORT - SCANNED
A1c: 10.9
EGFR: 84

## 2022-11-10 DIAGNOSIS — S42232D 3-part fracture of surgical neck of left humerus, subsequent encounter for fracture with routine healing: Secondary | ICD-10-CM | POA: Diagnosis not present

## 2022-11-10 DIAGNOSIS — M1711 Unilateral primary osteoarthritis, right knee: Secondary | ICD-10-CM | POA: Diagnosis not present

## 2022-11-12 DIAGNOSIS — E119 Type 2 diabetes mellitus without complications: Secondary | ICD-10-CM | POA: Diagnosis not present

## 2022-11-12 DIAGNOSIS — Z0100 Encounter for examination of eyes and vision without abnormal findings: Secondary | ICD-10-CM | POA: Diagnosis not present

## 2022-11-12 DIAGNOSIS — D3131 Benign neoplasm of right choroid: Secondary | ICD-10-CM | POA: Diagnosis not present

## 2022-11-12 DIAGNOSIS — H2513 Age-related nuclear cataract, bilateral: Secondary | ICD-10-CM | POA: Diagnosis not present

## 2022-11-12 LAB — HM DIABETES EYE EXAM

## 2022-11-13 ENCOUNTER — Other Ambulatory Visit: Payer: Self-pay | Admitting: Physician Assistant

## 2022-11-13 DIAGNOSIS — H6993 Unspecified Eustachian tube disorder, bilateral: Secondary | ICD-10-CM

## 2022-11-13 DIAGNOSIS — E1165 Type 2 diabetes mellitus with hyperglycemia: Secondary | ICD-10-CM

## 2022-11-15 NOTE — Telephone Encounter (Signed)
Requested medication (s) are due for refill today: review  Requested medication (s) are on the active medication list: no  Last refill:  07/10/21  Future visit scheduled: no  Notes to clinic:  unsure if pt is suppose to on this for DM or not. Didn't mention in LOV note  The original prescription was discontinued on 08/30/2022 by Shotwell, Orlean Bradford, RN for the following reason: Completed Course       Requested Prescriptions  Pending Prescriptions Disp Refills   TRULICITY 3 MG/0.5ML SOPN [Pharmacy Med Name: TRULICITY 3MG /0.5ML SDP 0.5ML] 6 mL 3    Sig: ADMINISTER 3 MG UNDER THE SKIN 1 TIME A WEEK AS DIRECTED     Endocrinology:  Diabetes - GLP-1 Receptor Agonists Failed - 11/13/2022  5:03 PM      Failed - HBA1C is between 0 and 7.9 and within 180 days    Hemoglobin A1C  Date Value Ref Range Status  05/19/2022 12.7 (A) 4.0 - 5.6 % Final   Hgb A1c MFr Bld  Date Value Ref Range Status  02/12/2022 11.8 (H) 4.8 - 5.6 % Final    Comment:             Prediabetes: 5.7 - 6.4          Diabetes: >6.4          Glycemic control for adults with diabetes: <7.0          Passed - Valid encounter within last 6 months    Recent Outpatient Visits           2 months ago Acute non-recurrent pansinusitis   [place] [facility] Erasmo Downer, MD   6 months ago Type 2 diabetes mellitus with hyperglycemia, without long-term current use of insulin (HCC)   [place] [facility] Alfredia Ferguson, PA-C   9 months ago Type 2 diabetes mellitus with hyperglycemia, without long-term current use of insulin [facility])   [place] [facility]ia Ferguson, PA-C   1 year ago Acute otitis externa of both ears, unspecified type   [facility] Alfredia Ferguson, PA-C   1 year ago Encounter for physical examination   [facility] Alfredia Ferguson, PA-C       Future Appointments             In 1 month  Willeen Niece, MD [facility]            Signed Prescriptions Disp Refills   fluticasone (FLONASE) 50 MCG/ACT nasal spray 16 g 6    Sig: SHAKE LIQUID AND USE 2 SPRAYS IN EACH NOSTRIL DAILY     Ear, Nose, and Throat: Nasal Preparations - Corticosteroids Passed - 11/13/2022  5:03 PM      Passed - Valid encounter within last 12 months    Recent Outpatient Visits           2 months ago Acute non-recurrent pansinusitis   [facility] Erasmo Downer, MD   6 months ago Type 2 diabetes mellitus with hyperglycemia, without long-term current use of insulin [facility])   [place] [facility] [place], [place], PA-C   9 months ago Type 2 diabetes mellitus with hyperglycemia, without long-term current use of insulin [facility])    [facility] Alfredia Ferguson, PA-C   1 year ago Acute otitis externa of both ears, unspecified type   [facility],  PA-C   1 year ago Encounter for physical examination   [facility] Alfredia Ferguson, PA-C       Future Appointments             In 1 month Willeen Niece, MD [facility]

## 2022-11-15 NOTE — Telephone Encounter (Signed)
Requested Prescriptions  Pending Prescriptions Disp Refills   fluticasone (FLONASE) 50 MCG/ACT nasal spray [Pharmacy Med Name: FLUTICASONE NASAL SP (120) RX] 16 g 6    Sig: SHAKE LIQUID AND USE 2 SPRAYS IN EACH NOSTRIL DAILY     Ear, Nose, and Throat: Nasal Preparations - Corticosteroids Passed - 11/13/2022  5:03 PM      Passed - Valid encounter within last 12 months    Recent Outpatient Visits           2 months ago Acute non-recurrent pansinusitis   [place] [facility] [place], Marzella Schlein, MD   6 months ago Type 2 diabetes mellitus with hyperglycemia, without long-term current use of insulin [facility])   [place] [facility] Alfredia Ferguson, PA-C   9 months ago Type 2 diabetes mellitus with hyperglycemia, without long-term current use of insulin (HCC)   [facility] Alfredia Ferguson, PA-C   1 year ago Acute otitis externa of both ears, unspecified type   [place] [facility]ia Ferguson, PA-C   1 year ago Encounter for physical examination   [facility], PA-C       Future Appointments             In 1 month Willeen Niece, MD [place] [facility]             TRULICITY 3 MG/0.5ML SOPN [Pharmacy Med Name: TRULICITY 3MG /0.5ML SDP 0.5ML] 6 mL 3    Sig: ADMINISTER 3 MG UNDER THE SKIN 1 TIME A WEEK AS DIRECTED     Endocrinology:  Diabetes - GLP-1 Receptor Agonists Failed - 11/13/2022  5:03 PM      Failed - HBA1C is between 0 and 7.9 and within 180 days    Hemoglobin A1C  Date Value Ref Range Status  05/19/2022 12.7 (A) 4.0 - 5.6 % Final   Hgb A1c MFr Bld  Date Value Ref Range Status  02/12/2022 11.8 (H) 4.8 - 5.6 % Final    Comment:             Prediabetes: 5.7 - 6.4          Diabetes: >6.4          Glycemic control for adults with diabetes: <7.0          Passed - Valid encounter within last 6 months    Recent Outpatient Visits            2 months ago Acute non-recurrent pansinusitis   [facility] Erasmo Downer, MD   6 months ago Type 2 diabetes mellitus with hyperglycemia, without long-term current use of insulin [facility])   [place] [facility] Alfredia Ferguson, PA-C   9 months ago Type 2 diabetes mellitus with hyperglycemia, without long-term current use of insulin [facility])   [place] [facility] Alfredia Ferguson, PA-C   1 year ago Acute otitis externa of both ears, unspecified type   [facility] Ferguson, PA-C   1 year ago Encounter for physical examination   [facility] Alfredia Ferguson, PA-C       Future Appointments             In 1 month Willeen Niece, MD [facility]

## 2022-11-29 ENCOUNTER — Ambulatory Visit: Payer: Self-pay | Admitting: *Deleted

## 2022-11-29 NOTE — Telephone Encounter (Signed)
  Chief Complaint: Fall Symptoms: Reports fall 6 days ago, getting out of vehicle. States had "Knot on head" Reports gone but "That area really sore." Frequency: 6 days ago Pertinent Negatives: Patient denies Dizziness, headache,weakness Disposition: [] ED /[] Urgent Care (no appt availability in office) / [x] Appointment(In office/virtual)/ []  [place] [facility]/ [] Home Care/ [] Refused Recommended Disposition /[] [place] [facility]/ []  Follow-up with PCP Additional Notes: Appt secured as pt requested. Care advise provided, pt verbalizes understanding.  Reason for Disposition  [1] Recent fall AND [2] no injury  MILD weakness (i.e., does not interfere with ability to work, go to school, normal activities)  (Exception: Mild weakness is a chronic symptom.)    No weakness. Pt requesting appt  Answer Assessment - Initial Assessment Questions 1. MECHANISM: "How did the fall happen?"     Tripped getting out of car 2. DOMESTIC VIOLENCE AND ELDER ABUSE SCREENING: "Did you fall because someone pushed you or tried to hurt you?" If Yes, ask: "Are you safe now?"     No 3. ONSET: "When did the fall happen?" (e.g., minutes, hours, or days ago)     Tuesday, 6 days ago 4. LOCATION: "What part of the body hit the ground?" (e.g., back, buttocks, head, hips, knees, hands, head, stomach)     Back, head 5. INJURY: "Did you hurt (injure) yourself when you fell?" If Yes, ask: "What did you injure? Tell me more about this?" (e.g., body area; type of injury; pain severity)"     2 small lacerations on finger 6. PAIN: "Is there any pain?" If Yes, ask: "How bad is the pain?" (e.g., Scale 1-10; or mild,  moderate, severe)   - NONE (0): No pain   - MILD (1-3): Doesn't interfere with normal activities    - MODERATE (4-7): Interferes with normal activities or awakens from sleep    - SEVERE (8-10): Excruciating pain, unable to do any normal activities      Sore 7. SIZE: For cuts, bruises, or swelling, ask: "How  large is it?" (e.g., inches or centimeters)      2 little cuts on small finger  9. OTHER SYMPTOMS: "Do you have any other symptoms?" (e.g., dizziness, fever, weakness; new onset or worsening).      no 10. CAUSE: "What do you think caused the fall (or falling)?" (e.g., tripped, dizzy spell)       Knee gave out  Protocols used: Falls and [facility]

## 2022-11-30 ENCOUNTER — Encounter: Payer: Self-pay | Admitting: Family Medicine

## 2022-11-30 ENCOUNTER — Ambulatory Visit (INDEPENDENT_AMBULATORY_CARE_PROVIDER_SITE_OTHER): Payer: Medicare HMO | Admitting: Family Medicine

## 2022-11-30 VITALS — BP 137/82 | HR 76 | Temp 97.8°F | Resp 20 | Wt 176.9 lb

## 2022-11-30 DIAGNOSIS — S0003XA Contusion of scalp, initial encounter: Secondary | ICD-10-CM | POA: Diagnosis not present

## 2022-11-30 DIAGNOSIS — W19XXXA Unspecified fall, initial encounter: Secondary | ICD-10-CM | POA: Diagnosis not present

## 2022-11-30 NOTE — Progress Notes (Signed)
I,Sulibeya S Dimas,acting as a scribe for Shirlee Latch, MD.,have documented all relevant documentation on the behalf of Shirlee Latch, MD,as directed by  Shirlee Latch, MD while in the presence of Shirlee Latch, MD.     Established patient visit   Patient: Christine Clarke   DOB: [birth year]   72 y.o. Female  MRN: [id number] Visit Date: 11/30/2022  Today's healthcare provider: Shirlee Latch, MD   Chief Complaint  Patient presents with   Head Injury   Subjective    HPI  Patient reports she fell last Tuesday outside of her house. She reports hitting her head on cement driveway. She reports pain and soreness. She denies any headaches, nausea, vomiting, or abnormal visual changes.   There was a knot initially, but it has improved. Went down quickly.   Medications: Outpatient Medications Prior to Visit  Medication Sig   B-D UF III MINI PEN NEEDLES 31G X 5 MM MISC daily. as directed   Blood Glucose Monitoring Suppl (CONTOUR NEXT ONE) KIT 1 kit by Does not apply route daily. To check blood sugar daily   BOTOX 100 units SOLR injection    carvedilol (COREG) 6.25 MG tablet Take 6.25 mg by mouth 2 (two) times daily.   COVID-19 At Home Test (COVID-19 OTC ANTIGEN 1-PACK VI) BinaxNOW COVID-19 Ag Self Test kit  FOLLOW PACKAGE DIRECTIONS   escitalopram (LEXAPRO) 10 MG tablet Take 1 tablet (10 mg total) by mouth at bedtime.   fluticasone (FLONASE) 50 MCG/ACT nasal spray SHAKE LIQUID AND USE 2 SPRAYS IN EACH NOSTRIL DAILY   glucose blood (CONTOUR NEXT TEST) test strip To check blood sugar daily   Lancets MISC To check blood sugar daily   levothyroxine (SYNTHROID) 50 MCG tablet TAKE 1 TABLET(50 MCG) BY MOUTH DAILY   meloxicam (MOBIC) 15 MG tablet Take 1 tablet (15 mg total) by mouth daily.   mometasone (ELOCON) 0.1 % lotion APPLY 2 TO 4 DROPS TO EACH EAR EVERY NIGHT AT BEDTIME FOR 5 DAYS THEN AS NEEDED FOR DRY AND ITCHY SKIN AS DIRECTED   montelukast (SINGULAIR) 10 MG tablet  Take 1 tablet (10 mg total) by mouth daily.   omeprazole (PRILOSEC) 20 MG capsule TAKE 1 CAPSULE(20 MG) BY MOUTH DAILY IN THE MORNING ON AN EMPTY STOMACH   simvastatin (ZOCOR) 40 MG tablet Take 1 tablet (40 mg total) by mouth at bedtime.   TRESIBA FLEXTOUCH 100 UNIT/ML FlexTouch Pen Inject 36 Units into the skin daily.   No facility-administered medications prior to visit.    Review of Systems  Constitutional:  Negative for chills.  Eyes:  Negative for visual disturbance.  Respiratory:  Negative for cough and chest tightness.   Cardiovascular:  Negative for chest pain.  Gastrointestinal:  Negative for abdominal pain, nausea and vomiting.  Neurological:  Negative for dizziness, light-headedness and headaches.       Objective    BP 137/82 (BP Location: Left Arm, Patient Position: Sitting, Cuff Size: Large)   Pulse 76   Temp 97.8 F (36.6 C) (Temporal)   Resp 20   Wt 176 lb 14.4 oz (80.2 kg)   LMP  (LMP Unknown)   BMI 34.55 kg/m  BP Readings from Last 3 Encounters:  11/30/22 137/82  09/23/22 (!) 160/96  08/31/22 138/85   Wt Readings from Last 3 Encounters:  11/30/22 176 lb 14.4 oz (80.2 kg)  09/23/22 178 lb 1.6 oz (80.8 kg)  09/16/22 179 lb 8 oz (81.4 kg)      Physical Exam  Vitals reviewed.  Constitutional:      General: She is not in acute distress.    Appearance: Normal appearance. She is well-developed. She is not diaphoretic.  HENT:     Head: Normocephalic and atraumatic.  Eyes:     General: No scleral icterus.    Conjunctiva/sclera: Conjunctivae normal.  Neck:     Thyroid: No thyromegaly.  Cardiovascular:     Rate and Rhythm: Normal rate and regular rhythm.     Heart sounds: Normal heart sounds. No murmur heard. Pulmonary:     Effort: Pulmonary effort is normal. No respiratory distress.     Breath sounds: Normal breath sounds. No wheezing, rhonchi or rales.  Musculoskeletal:     Cervical back: Neck supple.     Right lower leg: No edema.     Left lower  leg: No edema.  Lymphadenopathy:     Cervical: No cervical adenopathy.  Skin:    General: Skin is warm and dry.     Findings: No rash.  Neurological:     Mental Status: She is alert and oriented to person, place, and time. Mental status is at baseline.     Cranial Nerves: No cranial nerve deficit.     Sensory: No sensory deficit.     Motor: No weakness.     Coordination: Coordination normal.     Gait: Gait normal.  Psychiatric:        Mood and Affect: Mood normal.        Behavior: Behavior normal.       No results found for any visits on 11/30/22.  Assessment & Plan     Problem List Items Addressed This Visit   None Visit Diagnoses     Fall, initial encounter    -  Primary   Contusion of scalp, initial encounter          - new problem - fall with scalpcontusion was 1 wk ago - patient is neuro intact - discussed that if she had any intracranial pathology, it would have shown in neuro deficit by this time - she is not on anticoag - discussed fall precautions and conservative management - discussed return precautions  Return in about 3 months (around 03/01/2023) for CPE.      I, Shirlee Latch, MD, have reviewed all documentation for this visit. The documentation on 11/30/22 for the exam, diagnosis, procedures, and orders are all accurate and complete.   Kenzi Bardwell, Marzella Schlein, MD, MPH [facility]

## 2022-12-01 ENCOUNTER — Encounter: Payer: Self-pay | Admitting: Family Medicine

## 2023-01-03 ENCOUNTER — Other Ambulatory Visit: Payer: Self-pay | Admitting: Family Medicine

## 2023-01-03 DIAGNOSIS — E034 Atrophy of thyroid (acquired): Secondary | ICD-10-CM

## 2023-01-10 ENCOUNTER — Other Ambulatory Visit: Payer: Self-pay | Admitting: Physician Assistant

## 2023-01-10 DIAGNOSIS — R1012 Left upper quadrant pain: Secondary | ICD-10-CM

## 2023-01-11 ENCOUNTER — Ambulatory Visit: Payer: Medicare HMO | Admitting: Dermatology

## 2023-01-11 DIAGNOSIS — L853 Xerosis cutis: Secondary | ICD-10-CM

## 2023-01-11 DIAGNOSIS — X32XXXA Exposure to sunlight, initial encounter: Secondary | ICD-10-CM | POA: Diagnosis not present

## 2023-01-11 DIAGNOSIS — Z1283 Encounter for screening for malignant neoplasm of skin: Secondary | ICD-10-CM | POA: Diagnosis not present

## 2023-01-11 DIAGNOSIS — D229 Melanocytic nevi, unspecified: Secondary | ICD-10-CM

## 2023-01-11 DIAGNOSIS — L570 Actinic keratosis: Secondary | ICD-10-CM | POA: Diagnosis not present

## 2023-01-11 DIAGNOSIS — D1801 Hemangioma of skin and subcutaneous tissue: Secondary | ICD-10-CM | POA: Diagnosis not present

## 2023-01-11 DIAGNOSIS — L578 Other skin changes due to chronic exposure to nonionizing radiation: Secondary | ICD-10-CM | POA: Diagnosis not present

## 2023-01-11 DIAGNOSIS — L988 Other specified disorders of the skin and subcutaneous tissue: Secondary | ICD-10-CM

## 2023-01-11 DIAGNOSIS — L814 Other melanin hyperpigmentation: Secondary | ICD-10-CM

## 2023-01-11 DIAGNOSIS — W908XXA Exposure to other nonionizing radiation, initial encounter: Secondary | ICD-10-CM | POA: Diagnosis not present

## 2023-01-11 DIAGNOSIS — L821 Other seborrheic keratosis: Secondary | ICD-10-CM

## 2023-01-11 NOTE — Progress Notes (Signed)
Follow-Up Visit   Subjective  Christine Clarke is a 72 y.o. female who presents for the following: Skin Cancer Screening and Full Body Skin Exam  The patient presents for Total-Body Skin Exam (TBSE) for skin cancer screening and mole check. The patient has spots, moles and lesions to be evaluated, some may be new or changing and the patient has concerns that these could be cancer.  Patient did have a growth that looked like a wart at left frontal scalp but has resolved. No hx skin cancer.   The following portions of the chart were reviewed this encounter and updated as appropriate: medications, allergies, medical history  Review of Systems:  No other skin or systemic complaints except as noted in HPI or Assessment and Plan.  Objective  Well appearing patient in no apparent distress; mood and affect are within normal limits.  A full examination was performed including scalp, head, eyes, ears, nose, lips, neck, chest, axillae, abdomen, back, buttocks, bilateral upper extremities, bilateral lower extremities, hands, feet, fingers, toes, fingernails, and toenails. All findings within normal limits unless otherwise noted below.   Relevant physical exam findings are noted in the Assessment and Plan.  R upper calf x 1 Erythematous thin papules/macules with gritty scale.     Assessment & Plan   LENTIGINES, SEBORRHEIC KERATOSES, HEMANGIOMAS - Benign normal skin lesions - Benign-appearing - Call for any changes  MELANOCYTIC NEVI - Tan-brown and/or pink-flesh-colored symmetric macules and papules - Benign appearing on exam today - Observation - Call clinic for new or changing moles - Recommend daily use of broad spectrum spf 30+ sunscreen to sun-exposed areas.   ACTINIC DAMAGE - Chronic condition, secondary to cumulative UV/sun exposure - diffuse scaly erythematous macules with underlying dyspigmentation - Recommend daily broad spectrum sunscreen SPF 30+ to sun-exposed areas, reapply  every 2 hours as needed.  - Staying in the shade or wearing long sleeves, sun glasses (UVA+UVB protection) and wide brim hats (4-inch brim around the entire circumference of the hat) are also recommended for sun protection.  - Call for new or changing lesions.  SKIN CANCER SCREENING PERFORMED TODAY.  Xerosis - diffuse xerotic patches - recommend gentle, hydrating skin care - gentle skin care handout given - Recommend starting moisturizer with exfoliant (Urea, Salicylic acid, or Lactic acid) one to two times daily to help smooth rough and bumpy skin.  OTC options include Cetaphil Rough and Bumpy lotion (Urea), Eucerin Roughness Relief lotion or spot treatment cream (Urea), CeraVe SA lotion/cream for Rough and Bumpy skin (Sal Acid), Gold Bond Rough and Bumpy cream (Sal Acid), and AmLactin 12% lotion/cream (Lactic Acid).  If applying in morning, also apply sunscreen to sun-exposed areas, since these exfoliating moisturizers can increase sensitivity to sun.   AK (actinic keratosis) R upper calf x 1  Actinic keratoses are precancerous spots that appear secondary to cumulative UV radiation exposure/sun exposure over time. They are chronic with expected duration over 1 year. A portion of actinic keratoses will progress to squamous cell carcinoma of the skin. It is not possible to reliably predict which spots will progress to skin cancer and so treatment is recommended to prevent development of skin cancer.  Recommend daily broad spectrum sunscreen SPF 30+ to sun-exposed areas, reapply every 2 hours as needed.  Recommend staying in the shade or wearing long sleeves, sun glasses (UVA+UVB protection) and wide brim hats (4-inch brim around the entire circumference of the hat). Call for new or changing lesions.  Hypertrophic at right calf  Destruction of lesion - R upper calf x 1  Destruction method: cryotherapy   Informed consent: discussed and consent obtained   Lesion destroyed using liquid  nitrogen: Yes   Region frozen until ice ball extended beyond lesion: Yes   Outcome: patient tolerated procedure well with no complications   Post-procedure details: wound care instructions given   Additional details:  Prior to procedure, discussed risks of blister formation, small wound, skin dyspigmentation, or rare scar following cryotherapy. Recommend Vaseline ointment to treated areas while healing.   FACIAL ELASTOSIS Exam: Rhytides and volume loss.  Basic OTC daily skin care regimen to prevent photoaging:   Recommend facial moisturizer with sunscreen SPF 30 every morning (OTC brands include CeraVe AM, Neutrogena, Eucerin, Cetaphil, Aveeno, La Roche Posay).  Can also apply a topical Vit C serum which is an antioxidant (OTC brands include CeraVe, La Roche Posay, and The Ordinary) underneath sunscreen in morning. If you are outside during the day in the summer for extended periods, especially swimming and/or sweating, make sure you apply a water resistant facial sunscreen lotion spf 30 or higher.   At night recommend a cream with retinol (a vitamin A derivative which stimulates collagen production) like CeraVe skin renewing retinol serum or ROC retinol correxion cream or Neutrogena rapid wrinkle repair cream. Retinol may cause skin irritation in people with sensitive skin.  Can use it every other day and/or apply on top of a hyaluronic acid (HA) moisturizer/serum (Neutrogena Hydroboost water cream) if better tolerated that way.  Retinol may also help with lightening brown spots.   Our office sells high quality, medically tested skin care lines such as Elta MD sunscreens (with Zinc), and Alastin skin care products, which are very effective in treating photoaging. The Alastin line includes cosmeceutical grade Vit.C serum, HA serum, Elastin stimulating moisturizers/serums, lightening serum, and sunscreens.  If you want prescription treatment, then you would need an appointment (Rx tretinoin and fade  creams, Botox, filler injections, laser treatments, etc.) These prescriptions and procedures are not covered by insurance but work very well.  Recommend daily broad spectrum sunscreen SPF 30+ to sun-exposed areas, reapply every 2 hours as needed. Call for new or changing lesions.  Staying in the shade or wearing long sleeves, sun glasses (UVA+UVB protection) and wide brim hats (4-inch brim around the entire circumference of the hat) are also recommended for sun protection.   Return if symptoms worsen or fail to improve.  Anise Salvo, RMA, am acting as scribe for Willeen Niece, MD .   Documentation: I have reviewed the above documentation for accuracy and completeness, and I agree with the above.  Willeen Niece, MD

## 2023-01-11 NOTE — Patient Instructions (Addendum)
Cryotherapy Aftercare  Wash gently with soap and water everyday.   Apply Vaseline and Band-Aid daily until healed.    Recommend starting moisturizer with exfoliant (Urea, Salicylic acid, or Lactic acid) one to two times daily to help smooth rough and bumpy skin.  OTC options include Cetaphil Rough and Bumpy lotion (Urea), Eucerin Roughness Relief lotion or spot treatment cream (Urea), CeraVe SA lotion/cream for Rough and Bumpy skin (Sal Acid), Gold Bond Rough and Bumpy cream (Sal Acid), and AmLactin 12% lotion/cream (Lactic Acid).  If applying in morning, also apply sunscreen to sun-exposed areas, since these exfoliating moisturizers can increase sensitivity to sun.  Gentle Skin Care Guide  1. Bathe no more than once a day.  2. Avoid bathing in hot water  3. Use a mild soap like Dove, Vanicream, Cetaphil, CeraVe. Can use Lever 2000 or Cetaphil antibacterial soap  4. Use soap only where you need it. On most days, use it under your arms, between your legs, and on your feet. Let the water rinse other areas unless visibly dirty.  5. When you get out of the bath/shower, use a towel to gently blot your skin dry, don't rub it.  6. While your skin is still a little damp, apply a moisturizing cream such as Vanicream, CeraVe, Cetaphil, Eucerin, Sarna lotion or plain Vaseline Jelly. For hands apply Neutrogena [place] Hand Cream or Excipial Hand Cream.  7. Reapply moisturizer any time you start to itch or feel dry.  8. Sometimes using free and clear laundry detergents can be helpful. Fabric softener sheets should be avoided. Downy Free & Gentle liquid, or any liquid fabric softener that is free of dyes and perfumes, it acceptable to use  9. If your doctor has given you prescription creams you may apply moisturizers over them    Basic OTC daily skin care regimen to prevent photoaging:   Recommend facial moisturizer with sunscreen SPF 30 every morning (OTC brands include CeraVe AM, Neutrogena,  Eucerin, Cetaphil, Aveeno, La Roche Posay).  Can also apply a topical Vit C serum which is an antioxidant (OTC brands include CeraVe, La Roche Posay, and The Ordinary) underneath sunscreen in morning. If you are outside during the day in the summer for extended periods, especially swimming and/or sweating, make sure you apply a water resistant facial sunscreen lotion spf 30 or higher.   At night recommend a cream with retinol (a vitamin A derivative which stimulates collagen production) like CeraVe skin renewing retinol serum or ROC retinol correxion cream or Neutrogena rapid wrinkle repair cream. Retinol may cause skin irritation in people with sensitive skin.  Can use it every other day and/or apply on top of a hyaluronic acid (HA) moisturizer/serum (Neutrogena Hydroboost water cream) if better tolerated that way.  Retinol may also help with lightening brown spots.   Our office sells high quality, medically tested skin care lines such as Elta MD sunscreens (with Zinc), and Alastin skin care products, which are very effective in treating photoaging. The Alastin line includes cosmeceutical grade Vit.C serum, HA serum, Elastin stimulating moisturizers/serums, lightening serum, and sunscreens.  If you want prescription treatment, then you would need an appointment (Rx tretinoin and fade creams, Botox, filler injections, laser treatments, etc.) These prescriptions and procedures are not covered by insurance but work very well.   Melanoma ABCDEs  Melanoma is the most dangerous type of skin cancer, and is the leading cause of death from skin disease.  You are more likely to develop melanoma if you: Have light-colored  skin, light-colored eyes, or red or blond hair Spend a lot of time in the sun Tan regularly, either outdoors or in a tanning bed Have had blistering sunburns, especially during childhood Have a close family member who has had a melanoma Have atypical moles or large birthmarks  Early  detection of melanoma is key since treatment is typically straightforward and cure rates are extremely high if we catch it early.   The first sign of melanoma is often a change in a mole or a new dark spot.  The ABCDE system is a way of remembering the signs of melanoma.  A for asymmetry:  The two halves do not match. B for border:  The edges of the growth are irregular. C for color:  A mixture of colors are present instead of an even brown color. D for diameter:  Melanomas are usually (but not always) greater than 6mm - the size of a pencil eraser. E for evolution:  The spot keeps changing in size, shape, and color.  Please check your skin once per month between visits. You can use a small mirror in front and a large mirror behind you to keep an eye on the back side or your body.   If you see any new or changing lesions before your next follow-up, please call to schedule a visit.  Please continue daily skin protection including broad spectrum sunscreen SPF 30+ to sun-exposed areas, reapplying every 2 hours as needed when you're outdoors.    Due to recent changes in healthcare laws, you may see results of your pathology and/or laboratory studies on MyChart before the doctors have had a chance to review them. We understand that in some cases there may be results that are confusing or concerning to you. Please understand that not all results are received at the same time and often the doctors may need to interpret multiple results in order to provide you with the best plan of care or course of treatment. Therefore, we ask that you please give [place] 2 business days to thoroughly review all your results before contacting the office for clarification. Should we see a critical lab result, you will be contacted sooner.   If You Need Anything After Your Visit  If you have any questions or concerns for your doctor, please call our main line at [phone] and press option 4 to reach your doctor's medical  assistant. If no one answers, please leave a voicemail as directed and we will return your call as soon as possible. Messages left after 4 pm will be answered the following business day.   You may also send [place] a message via MyChart. We typically respond to MyChart messages within 1-2 business days.  For prescription refills, please ask your pharmacy to contact our office. Our fax number is [phone].  If you have an urgent issue when the clinic is closed that cannot wait until the next business day, you can page your doctor at the number below.    Please note that while we do our best to be available for urgent issues outside of office hours, we are not available 24/7.   If you have an urgent issue and are unable to reach [place], you may choose to seek medical care at your doctor's office, retail clinic, urgent care center, or emergency room.  If you have a medical emergency, please immediately call 911 or go to the emergency department.  Pager Numbers  - Dr. Gwen Pounds: [phone]  - Dr. Neale Burly:  [phone]  - Dr. Roseanne Reno: [phone]  In the event of inclement weather, please call our main line at [phone] for an update on the status of any delays or closures.  Dermatology Medication Tips: Please keep the boxes that topical medications come in in order to help keep track of the instructions about where and how to use these. Pharmacies typically print the medication instructions only on the boxes and not directly on the medication tubes.   If your medication is too expensive, please contact our office at [phone] option 4 or send [place] a message through MyChart.   We are unable to tell what your co-pay for medications will be in advance as this is different depending on your insurance coverage. However, we may be able to find a substitute medication at lower cost or fill out paperwork to get insurance to cover a needed medication.   If a prior authorization is required to get your  medication covered by your insurance company, please allow [place] 1-2 business days to complete this process.  Drug prices often vary depending on where the prescription is filled and some pharmacies may offer cheaper prices.  The website www.goodrx.com contains coupons for medications through different pharmacies. The prices here do not account for what the cost may be with help from insurance (it may be cheaper with your insurance), but the website can give you the price if you did not use any insurance.  - You can print the associated coupon and take it with your prescription to the pharmacy.  - You may also stop by our office during regular business hours and pick up a GoodRx coupon card.  - If you need your prescription sent electronically to a different pharmacy, notify our office through [facility] or by phone at [phone] option 4.

## 2023-01-21 ENCOUNTER — Encounter: Payer: Self-pay | Admitting: Family Medicine

## 2023-01-21 ENCOUNTER — Ambulatory Visit (INDEPENDENT_AMBULATORY_CARE_PROVIDER_SITE_OTHER): Payer: Medicare HMO | Admitting: Family Medicine

## 2023-01-21 ENCOUNTER — Ambulatory Visit: Payer: Self-pay | Admitting: *Deleted

## 2023-01-21 VITALS — BP 159/77 | HR 73 | Temp 98.2°F | Resp 20 | Wt 180.8 lb

## 2023-01-21 DIAGNOSIS — J301 Allergic rhinitis due to pollen: Secondary | ICD-10-CM | POA: Diagnosis not present

## 2023-01-21 DIAGNOSIS — H60503 Unspecified acute noninfective otitis externa, bilateral: Secondary | ICD-10-CM

## 2023-01-21 DIAGNOSIS — E034 Atrophy of thyroid (acquired): Secondary | ICD-10-CM

## 2023-01-21 MED ORDER — NEOMYCIN-POLYMYXIN-HC 3.5-10000-1 OT SOLN: 3.0000 [drp] | Freq: Four times a day (QID) | OTIC | 0 refills | Status: AC

## 2023-01-21 MED ORDER — AZELASTINE HCL 0.1 % NA SOLN
2.0000 | Freq: Two times a day (BID) | NASAL | 2 refills | Status: DC
Start: 2023-01-21 — End: 2023-07-08

## 2023-01-21 MED ORDER — LEVOTHYROXINE SODIUM 50 MCG PO TABS: 50.0000 ug | ORAL_TABLET | Freq: Every day | ORAL | 0 refills | Status: DC

## 2023-01-21 NOTE — Patient Instructions (Signed)
.   Please review the attached list of medications and notify my office if there are any errors.   . Please bring all of your medications to every appointment so we can make sure that our medication list is the same as yours.   

## 2023-01-21 NOTE — Progress Notes (Signed)
I,Sulibeya S Dimas,acting as a scribe for Mila Merry, MD.,have documented all relevant documentation on the behalf of Mila Merry, MD,as directed by  Mila Merry, MD      Established patient visit   Patient: Christine Clarke   DOB: 1950-11-01   72 y.o. Female  MRN: [id number] Visit Date: 01/21/2023  Today's healthcare provider: Mila Merry, MD   Chief Complaint  Patient presents with   Ear Pain   Subjective    Otalgia  There is pain in both ears. This is a new problem. The current episode started in the past 7 days. The problem has been waxing and waning. There has been no fever. The pain is mild. Associated symptoms include ear discharge, hearing loss, rhinorrhea and a sore throat. Pertinent negatives include no coughing. She has tried ear drops (monetasone 0.1%) for the symptoms. The treatment provided mild relief.  She had similar sx over a year ago and prescribed cortisporin by Alfredia Ferguson. She then had follow up with Dr. Andee Poles who prescribed mometasone which at that time seemed to be more effective.   She also request refill for her levothyroxine. She is noted to have diabetes and reports she is now being followed by Ocie Cornfield, MD in [place], and states it hasn't been too long since she had a complete blood panel. She thinks thyroid functions were checked.   Medications: Outpatient Medications Prior to Visit  Medication Sig   B-D UF III MINI PEN NEEDLES 31G X 5 MM MISC daily. as directed   Blood Glucose Monitoring Suppl (CONTOUR NEXT ONE) KIT 1 kit by Does not apply route daily. To check blood sugar daily   BOTOX 100 units SOLR injection    carvedilol (COREG) 6.25 MG tablet Take 6.25 mg by mouth 2 (two) times daily.   escitalopram (LEXAPRO) 10 MG tablet Take 1 tablet (10 mg total) by mouth at bedtime.   fluticasone (FLONASE) 50 MCG/ACT nasal spray SHAKE LIQUID AND USE 2 SPRAYS IN EACH NOSTRIL DAILY   glucose blood (CONTOUR NEXT TEST) test strip To check blood sugar  daily   Lancets MISC To check blood sugar daily   levothyroxine (SYNTHROID) 50 MCG tablet Take 1 tablet (50 mcg total) by mouth daily before breakfast. Please schedule office visit before any future refill.   meloxicam (MOBIC) 15 MG tablet Take 1 tablet (15 mg total) by mouth daily.   mometasone (ELOCON) 0.1 % lotion APPLY 2 TO 4 DROPS TO EACH EAR EVERY NIGHT AT BEDTIME FOR 5 DAYS THEN AS NEEDED FOR DRY AND ITCHY SKIN AS DIRECTED   montelukast (SINGULAIR) 10 MG tablet Take 1 tablet (10 mg total) by mouth daily.   omeprazole (PRILOSEC) 20 MG capsule TAKE 1 CAPSULE(20 MG) BY MOUTH DAILY IN THE MORNING ON AN EMPTY STOMACH   simvastatin (ZOCOR) 40 MG tablet Take 1 tablet (40 mg total) by mouth at bedtime.   TRESIBA FLEXTOUCH 100 UNIT/ML FlexTouch Pen Inject 36 Units into the skin daily.   [DISCONTINUED] COVID-19 At Home Test (COVID-19 OTC ANTIGEN 1-PACK VI) BinaxNOW COVID-19 Ag Self Test kit  FOLLOW PACKAGE DIRECTIONS (Patient not taking: Reported on 01/21/2023)   No facility-administered medications prior to visit.    Review of Systems  Constitutional:  Negative for chills and fever.  HENT:  Positive for ear discharge, ear pain, hearing loss, postnasal drip, rhinorrhea and sore throat.   Respiratory:  Negative for cough.        Objective    BP (!) 159/77 (BP  Location: Left Arm, Patient Position: Sitting, Cuff Size: Large)   Pulse 73   Temp 98.2 F (36.8 C) (Temporal)   Resp 20   Wt 180 lb 12.8 oz (82 kg)   LMP  (LMP Unknown)   SpO2 98%   BMI 35.31 kg/m  BP Readings from Last 3 Encounters:  01/21/23 (!) 159/77  11/30/22 137/82  09/23/22 (!) 160/96   Wt Readings from Last 3 Encounters:  01/21/23 180 lb 12.8 oz (82 kg)  11/30/22 176 lb 14.4 oz (80.2 kg)  09/23/22 178 lb 1.6 oz (80.8 kg)   Physical Exam  General Appearance:    Mildly obese female, alert, cooperative, in no acute distress  HENT:   bilateral TM normal without fluid or infection. Canals flaky and irritated. Neck  without nodes, post nasal drip noted, and nasal mucosa pale and congested  Eyes:    PERRL, conjunctiva/corneas clear, EOM's intact       Lungs:     Clear to auscultation bilaterally, respirations unlabored  Heart:    Normal heart rate. Normal rhythm. No murmurs, rubs, or gallops.    Neurologic:   Awake, alert, oriented x 3. No apparent focal neurological           defect.        Assessment & Plan     1. Acute otitis externa of both ears, unspecified type Prescribed mometasone by Dr. Andee Poles in 2023. Has been using leftover, but doesn't seem to be working as well.   - neomycin-polymyxin-hydrocortisone (CORTISPORIN) OTIC solution; Place 3 drops into both ears 4 (four) times daily for 7 days.  Dispense: 10 mL; Refill: 0  2. Hypothyroidism due to acquired atrophy of thyroid She things her endocrinologist may have checked thyroid functions on recent labs. Have requested all lab records from Dr. Roanna Raider.  - levothyroxine (SYNTHROID) 50 MCG tablet; Take 1 tablet (50 mcg total) by mouth daily before breakfast. Please schedule office visit before any future refill.  Dispense: 90 tablet; Refill: 0  She is to schedule follow up or CPE with Dr. Beryle Flock   3. Seasonal allergic rhinitis due to pollen Continue fluticasone nasal spray and add azelastine (ASTELIN) 0.1 % nasal spray; Place 2 sprays into both nostrils 2 (two) times daily. Use in each nostril as directed  Dispense: 30 mL; Refill: 2      The entirety of the information documented in the History of Present Illness, Review of Systems and Physical Exam were personally obtained by me. Portions of this information were initially documented by the CMA and reviewed by me for thoroughness and accuracy.     Mila Merry, MD  [facility] [phone] (phone) [phone] (fax)  [facility]

## 2023-01-21 NOTE — Telephone Encounter (Signed)
Summary: Pt requests Rx for a medication for vertigo   Pt stated she forgot to mention to Dr. Sherrie Mustache that she had vertigo last Friday and she would like to know if a Rx can be called in for her. Cb# [phone]       Chief Complaint: requesting medication for vertigo , reports forgot to mention in OV today . Symptoms: no dizziness or vertigo now. Reports she had episode 01/14/23 and forgot to mention to provider today during OV. Last Friday room spinning vomiting bowel issues. Crawled to bathroom . Saturday started feeling better. Reports "lost my mind and forgot to mention it". Frequency: 01/14/23 Pertinent Negatives: Patient denies sx now  Disposition: [] ED /[] Urgent Care (no appt availability in office) / [] Appointment(In office/virtual)/ []  [facility]/ [] Home Care/ [] Refused Recommended Disposition /[] [facility]/ [x]  Follow-up with PCP Additional Notes:   Please advise if provider Dr. Sherrie Mustache will prescribe medication for vertigo if sx return. Forgot to mention in today's OV. Please advise.       Reason for Disposition  Prescription request for new medicine (not a refill)  [1] MILD dizziness (e.g., vertigo; walking normally) AND [2] has been evaluated by doctor (or NP/PA) for this  Answer Assessment - Initial Assessment Questions 1. NAME of MEDICINE: "What medicine(s) are you calling about?"     Can Dr. Sherrie Mustache prescribed medication for vertigo forgot to ask in OV today  2. QUESTION: "What is your question?" (e.g., double dose of medicine, side effect)      Can Dr. Sherrie Mustache prescribe medication for vertigo ? 3. PRESCRIBER: "Who prescribed the medicine?" Reason: if prescribed by specialist, call should be referred to that group.     na 4. SYMPTOMS: "Do you have any symptoms?" If Yes, ask: "What symptoms are you having?"  "How bad are the symptoms (e.g., mild, moderate, severe)     Issues with ears 5. PREGNANCY:  "Is there any chance that you are pregnant?" "When  was your last menstrual period?"     na  Answer Assessment - Initial Assessment Questions 1. DESCRIPTION: "Describe your dizziness."     None now  2. VERTIGO: "Do you feel like either you or the room is spinning or tilting?"      Room spinning last Friday with vomiting and bowel issues 3. LIGHTHEADED: "Do you feel lightheaded?" (e.g., somewhat faint, woozy, weak upon standing)     Not now  4. SEVERITY: "How bad is it?"  "Can you walk?"   - MILD: Feels slightly dizzy and unsteady, but is walking normally.   - MODERATE: Feels unsteady when walking, but not falling; interferes with normal activities (e.g., school, work).   - SEVERE: Unable to walk without falling, or requires assistance to walk without falling.     Last Friday has to crawl to bathroom  5. ONSET:  "When did the dizziness begin?"     Last Friday 01/14/23 6. AGGRAVATING FACTORS: "Does anything make it worse?" (e.g., standing, change in head position)     Na  7. CAUSE: "What do you think is causing the dizziness?"     Issues with ears seen today in OV  8. RECURRENT SYMPTOM: "Have you had dizziness before?" If Yes, ask: "When was the last time?" "What happened that time?"     na 9. OTHER SYMPTOMS: "Do you have any other symptoms?" (e.g., headache, weakness, numbness, vomiting, earache)     No sx now  10. PREGNANCY: "Is there any chance you are pregnant?" "  When was your last menstrual period?"       na  Protocols used: Medication Question Call-A-AH, Dizziness - Vertigo-A-AH

## 2023-02-08 ENCOUNTER — Ambulatory Visit (INDEPENDENT_AMBULATORY_CARE_PROVIDER_SITE_OTHER): Payer: Medicare HMO | Admitting: Family Medicine

## 2023-02-08 ENCOUNTER — Encounter: Payer: Self-pay | Admitting: Family Medicine

## 2023-02-08 VITALS — BP 155/71 | HR 74 | Temp 99.2°F | Ht 60.0 in | Wt 180.0 lb

## 2023-02-08 DIAGNOSIS — I152 Hypertension secondary to endocrine disorders: Secondary | ICD-10-CM | POA: Diagnosis not present

## 2023-02-08 DIAGNOSIS — J309 Allergic rhinitis, unspecified: Secondary | ICD-10-CM

## 2023-02-08 DIAGNOSIS — E1159 Type 2 diabetes mellitus with other circulatory complications: Secondary | ICD-10-CM | POA: Diagnosis not present

## 2023-02-08 DIAGNOSIS — M545 Low back pain, unspecified: Secondary | ICD-10-CM

## 2023-02-08 DIAGNOSIS — R0982 Postnasal drip: Secondary | ICD-10-CM | POA: Diagnosis not present

## 2023-02-08 MED ORDER — CETIRIZINE HCL 10 MG PO TABS: 10.0000 mg | ORAL_TABLET | Freq: Every day | ORAL | 11 refills | Status: DC

## 2023-02-08 NOTE — Addendum Note (Signed)
Addended by: Jacquenette Shone on: 02/08/2023 01:26 PM   Modules accepted: Level of Service

## 2023-02-08 NOTE — Progress Notes (Signed)
Established patient visit   Patient: Christine Clarke   DOB: September 11, 1950   72 y.o. Female  MRN: [id number] Visit Date: 02/08/2023  Today's healthcare provider: Sherlyn Hay, DO   Chief Complaint  Patient presents with   Back Pain    Patient has had back pain for 4 days.  She states he urine is dark and shse does not drink enough water.   Palpitations    Patient has felt this before but states she has been getting it more often.  She does to think she is taking any Carvedilol as she did not recognize it and does not think she ever got it.    Subjective    Back Pain Pertinent negatives include no dysuria or fever.  Palpitations  Pertinent negatives include no fever.   HPI     Back Pain    Additional comments: Patient has had back pain for 4 days.  She states he urine is dark and shse does not drink enough water.        Palpitations    Additional comments: Patient has felt this before but states she has been getting it more often.  She does to think she is taking any Carvedilol as she did not recognize it and does not think she ever got it.       Last edited by Adline Peals, CMA on 02/08/2023 10:05 AM.      Back pain  - low back bilaterally  - hurt a little bit today; resolved now  - hurt more previously - was an intermittent dull ache, unaffected by movement  - no knowledge of an injury or definitely picking up something too heavy.  - no pain/burning with urination  Left knee pain  - sees [facility]  - previously had right knee injection  - left knee pain improved randomly one day then returned; now has trouble walking up steps  - will be asking for knee injection at next appointment  - Thinks she does have carvedilol and is taking it. Discussed that it needs to be taken twice a day, not two tablets at one time. - Sees endo on Thursday - says she (Dr. Roanna Raider) took her off of her BP meds; it appears she also placed her on the carvedilol; this is  unclear. - Has appointment for physical exam scheduled with Dr. Beryle Flock.  Still having post-nasal drainage and ear are bothing her still - Uses azelastine spray (expensive per pt)when she remembers; has flonase at home - Not taking allergy pill (used to take loratadine or a different one (doesn't remember which)) - used to receive allergy shots - taking montelukast    Medications: Outpatient Medications Prior to Visit  Medication Sig   azelastine (ASTELIN) 0.1 % nasal spray Place 2 sprays into both nostrils 2 (two) times daily. Use in each nostril as directed   B-D UF III MINI PEN NEEDLES 31G X 5 MM MISC daily. as directed   Blood Glucose Monitoring Suppl (CONTOUR NEXT ONE) KIT 1 kit by Does not apply route daily. To check blood sugar daily   BOTOX 100 units SOLR injection    carvedilol (COREG) 6.25 MG tablet Take 6.25 mg by mouth 2 (two) times daily.   escitalopram (LEXAPRO) 10 MG tablet Take 1 tablet (10 mg total) by mouth at bedtime.   fluticasone (FLONASE) 50 MCG/ACT nasal spray SHAKE LIQUID AND USE 2 SPRAYS IN EACH NOSTRIL DAILY   glucose blood (  CONTOUR NEXT TEST) test strip To check blood sugar daily   Lancets MISC To check blood sugar daily   levothyroxine (SYNTHROID) 50 MCG tablet Take 1 tablet (50 mcg total) by mouth daily before breakfast. Please schedule office visit before any future refill.   meloxicam (MOBIC) 15 MG tablet Take 1 tablet (15 mg total) by mouth daily.   mometasone (ELOCON) 0.1 % lotion APPLY 2 TO 4 DROPS TO EACH EAR EVERY NIGHT AT BEDTIME FOR 5 DAYS THEN AS NEEDED FOR DRY AND ITCHY SKIN AS DIRECTED   montelukast (SINGULAIR) 10 MG tablet Take 1 tablet (10 mg total) by mouth daily.   omeprazole (PRILOSEC) 20 MG capsule TAKE 1 CAPSULE(20 MG) BY MOUTH DAILY IN THE MORNING ON AN EMPTY STOMACH   simvastatin (ZOCOR) 40 MG tablet Take 1 tablet (40 mg total) by mouth at bedtime.   TRESIBA FLEXTOUCH 100 UNIT/ML FlexTouch Pen Inject 36 Units into the skin daily.    No facility-administered medications prior to visit.    Review of Systems  Constitutional:  Negative for chills, fatigue and fever.  HENT:  Positive for ear pain and rhinorrhea.   Eyes:  Negative for visual disturbance.  Cardiovascular:  Positive for palpitations.  Genitourinary:  Positive for flank pain. Negative for difficulty urinating, dysuria, frequency and urgency.  Musculoskeletal:  Positive for back pain.  Allergic/Immunologic: Positive for environmental allergies.       Objective    BP (!) 155/71 (BP Location: Left Arm, Patient Position: Sitting, Cuff Size: Normal)   Pulse 74   Temp 99.2 F (37.3 C) (Oral)   Ht 5' (1.524 m)   Wt 180 lb (81.6 kg)   LMP  (LMP Unknown)   SpO2 97%   BMI 35.15 kg/m    Physical Exam Vitals reviewed.  Constitutional:      General: She is not in acute distress.    Appearance: Normal appearance. She is well-developed. She is not diaphoretic.  HENT:     Head: Normocephalic and atraumatic.     Right Ear: Tympanic membrane, ear canal and external ear normal.     Left Ear: Tympanic membrane, ear canal and external ear normal.     Nose: Nose normal.     Mouth/Throat:     Mouth: Mucous membranes are moist.     Pharynx: Oropharynx is clear. No oropharyngeal exudate.  Eyes:     General: No scleral icterus.    Conjunctiva/sclera: Conjunctivae normal.     Pupils: Pupils are equal, round, and reactive to light.  Neck:     Thyroid: No thyromegaly.  Cardiovascular:     Rate and Rhythm: Normal rate and regular rhythm.     Pulses: Normal pulses.     Heart sounds: Normal heart sounds. No murmur heard. Pulmonary:     Effort: Pulmonary effort is normal. No respiratory distress.     Breath sounds: Normal breath sounds. No wheezing or rales.  Abdominal:     General: There is no distension.     Palpations: Abdomen is soft.     Tenderness: There is no abdominal tenderness.     Comments: NEGATIVE lloyd's sign bilaterally  Musculoskeletal:         General: No deformity.     Cervical back: Neck supple.     Right lower leg: No edema.     Left lower leg: No edema.  Lymphadenopathy:     Cervical: No cervical adenopathy.  Skin:    General: Skin is warm and dry.  Findings: No rash.  Neurological:     Mental Status: She is alert and oriented to person, place, and time. Mental status is at baseline.     Gait: Gait normal.  Psychiatric:        Mood and Affect: Mood normal.        Behavior: Behavior normal.        Thought Content: Thought content normal.      No results found for any visits on 02/08/23.  Assessment & Plan    1. Acute bilateral low back pain without sciatica Patient's back pain is significantly improved at this time.  Discussed option of evaluating urine for possible and possible infection, though this is a low likelihood due to her lack of other symptoms.  Patient declined at this time.  Encouraged patient to drink plenty of fluids, advising her that she may lightly flavor it if this helps to increase her ability to do this.  She will contact us if her symptoms do not improve and I will plan to order UA at that time.  2. Allergic rhinitis with postnasal drip Patient continues to have postnasal drip and very mild ear pain.  Discussed with her that her symptoms likely stem from allergies given her currently benign presentation.  Prescribed cetirizine as noted below.  Discussed that her insurance may not cover it as it is an over-the-counter medication.  She does receive a stipend from her insurance to use to purchase over-the-counter goods and is aware she can use this to purchase her cetirizine if it is not covered. - cetirizine (ZYRTEC) 10 MG tablet; Take 1 tablet (10 mg total) by mouth daily.  Dispense: 30 tablet; Refill: 11  3. Hypertension associated with diabetes [facility]) Patient wants to follow-up with her endocrinologist for management of this as she was the one to most recently adjust her antihypertensives. It  is unclear why this change was made. Patient has requested records.   Return in 12 weeks (on 05/03/2023) for with Dr. B for CPE.      I discussed the assessment and treatment plan with the patient  The patient was provided an opportunity to ask questions and all were answered. The patient agreed with the plan and demonstrated an understanding of the instructions.   The patient was advised to call back or seek an in-person evaluation if the symptoms worsen or if the condition fails to improve as anticipated.  Total time was 50 minutes. That includes chart review before the visit, the actual patient visit, and time spent on documentation after the visit.    Sherlyn Hay, DO  [facility] [phone] (phone) [phone] (fax)  [facility]

## 2023-02-08 NOTE — Addendum Note (Signed)
Addended by: Jacquenette Shone on: 02/08/2023 01:10 PM   Modules accepted: Level of Service

## 2023-02-10 DIAGNOSIS — I10 Essential (primary) hypertension: Secondary | ICD-10-CM | POA: Diagnosis not present

## 2023-02-10 DIAGNOSIS — E782 Mixed hyperlipidemia: Secondary | ICD-10-CM | POA: Diagnosis not present

## 2023-02-10 DIAGNOSIS — E669 Obesity, unspecified: Secondary | ICD-10-CM | POA: Diagnosis not present

## 2023-02-10 DIAGNOSIS — E039 Hypothyroidism, unspecified: Secondary | ICD-10-CM | POA: Diagnosis not present

## 2023-02-10 DIAGNOSIS — E1165 Type 2 diabetes mellitus with hyperglycemia: Secondary | ICD-10-CM | POA: Diagnosis not present

## 2023-02-10 DIAGNOSIS — R4189 Other symptoms and signs involving cognitive functions and awareness: Secondary | ICD-10-CM | POA: Diagnosis not present

## 2023-02-11 DIAGNOSIS — M25562 Pain in left knee: Secondary | ICD-10-CM | POA: Diagnosis not present

## 2023-02-11 DIAGNOSIS — M79602 Pain in left arm: Secondary | ICD-10-CM | POA: Diagnosis not present

## 2023-02-11 DIAGNOSIS — M1712 Unilateral primary osteoarthritis, left knee: Secondary | ICD-10-CM | POA: Diagnosis not present

## 2023-02-13 LAB — LIPID PANEL: LDL Cholesterol: 106

## 2023-02-14 LAB — COMPREHENSIVE METABOLIC PANEL
Albumin: 4.2 (ref 3.5–5.0)
Calcium: 9.1 (ref 8.7–10.7)
eGFR: 71

## 2023-02-14 LAB — BASIC METABOLIC PANEL
BUN: 20 (ref 4–21)
CO2: 29 — AB (ref 13–22)
Chloride: 100 (ref 99–108)
Creatinine: 0.9 (ref 0.5–1.1)
Glucose: 403
Potassium: 4.3 meq/L (ref 3.5–5.1)
Sodium: 136 — AB (ref 137–147)

## 2023-02-14 LAB — HEMOGLOBIN A1C: Hemoglobin A1C: 11.3

## 2023-04-11 DIAGNOSIS — M1711 Unilateral primary osteoarthritis, right knee: Secondary | ICD-10-CM | POA: Diagnosis not present

## 2023-04-11 DIAGNOSIS — M25511 Pain in right shoulder: Secondary | ICD-10-CM | POA: Diagnosis not present

## 2023-04-11 DIAGNOSIS — M25512 Pain in left shoulder: Secondary | ICD-10-CM | POA: Diagnosis not present

## 2023-04-12 ENCOUNTER — Other Ambulatory Visit: Payer: Self-pay | Admitting: Physician Assistant

## 2023-04-12 DIAGNOSIS — E1169 Type 2 diabetes mellitus with other specified complication: Secondary | ICD-10-CM

## 2023-05-03 ENCOUNTER — Encounter: Payer: Self-pay | Admitting: Family Medicine

## 2023-05-03 ENCOUNTER — Ambulatory Visit: Payer: Medicare HMO | Admitting: Family Medicine

## 2023-05-03 ENCOUNTER — Ambulatory Visit: Payer: Medicare HMO | Attending: Family Medicine

## 2023-05-03 VITALS — BP 153/78 | HR 85 | Ht 60.0 in | Wt 183.6 lb

## 2023-05-03 DIAGNOSIS — F320 Major depressive disorder, single episode, mild: Secondary | ICD-10-CM

## 2023-05-03 DIAGNOSIS — R002 Palpitations: Secondary | ICD-10-CM

## 2023-05-03 DIAGNOSIS — Z23 Encounter for immunization: Secondary | ICD-10-CM

## 2023-05-03 DIAGNOSIS — E1159 Type 2 diabetes mellitus with other circulatory complications: Secondary | ICD-10-CM | POA: Diagnosis not present

## 2023-05-03 DIAGNOSIS — E1169 Type 2 diabetes mellitus with other specified complication: Secondary | ICD-10-CM

## 2023-05-03 DIAGNOSIS — E1165 Type 2 diabetes mellitus with hyperglycemia: Secondary | ICD-10-CM | POA: Diagnosis not present

## 2023-05-03 DIAGNOSIS — I152 Hypertension secondary to endocrine disorders: Secondary | ICD-10-CM

## 2023-05-03 DIAGNOSIS — E034 Atrophy of thyroid (acquired): Secondary | ICD-10-CM

## 2023-05-03 DIAGNOSIS — Z794 Long term (current) use of insulin: Secondary | ICD-10-CM | POA: Diagnosis not present

## 2023-05-03 DIAGNOSIS — E039 Hypothyroidism, unspecified: Secondary | ICD-10-CM

## 2023-05-03 DIAGNOSIS — F411 Generalized anxiety disorder: Secondary | ICD-10-CM

## 2023-05-03 DIAGNOSIS — Z1231 Encounter for screening mammogram for malignant neoplasm of breast: Secondary | ICD-10-CM

## 2023-05-03 MED ORDER — LOSARTAN POTASSIUM 50 MG PO TABS: 50.0000 mg | ORAL_TABLET | Freq: Every day | ORAL | 1 refills | Status: DC

## 2023-05-03 MED ORDER — LEVOTHYROXINE SODIUM 50 MCG PO TABS
50.0000 ug | ORAL_TABLET | Freq: Every day | ORAL | 1 refills | Status: DC
Start: 2023-05-03 — End: 2023-11-10

## 2023-05-03 NOTE — Assessment & Plan Note (Signed)
Previously elevated Continue statin Repeat FLP and CMP Goal LDL < 70  

## 2023-05-03 NOTE — Progress Notes (Signed)
Established Patient Office Visit  Subjective   Patient ID: Christine Clarke, female    DOB: 1951-07-13  Age: 72 y.o. MRN: [id number]  Chief Complaint  Patient presents with   Medical Management of Chronic Issues    Follow up, Would like to discuss BP medication, mammogram, test regarding stool sample     HPI  Discussed the use of AI scribe software for clinical note transcription with the patient, who gave verbal consent to proceed.  History of Present Illness   A 72 year old female with a history of type 2 diabetes, hyperlipidemia, hypertension, hypothyroidism, and MDD presents for a follow-up of her chronic conditions. She reports that her blood pressure has been high despite taking carvedilol 6.25 mg twice daily. She has been monitoring her blood pressure at home with an automatic cuff. She also reports that her blood sugar levels have been consistently high despite taking Tresiba 36 units daily. She admits to not adhering to a diabetic diet and missing two days of insulin while at the beach. She also reports experiencing palpitations, described as a "funny feeling" in her chest, especially when lying down. The frequency of these episodes is inconsistent. She also reports ongoing issues with her knee, for which she has received steroid injections in the past. She is due for a mammogram and colon cancer screening. She also expresses concerns about her energy levels and ongoing worries about various life stressors.         ROS per HPI    Objective:     BP (!) 153/78 Comment: home reading  Pulse 85   Ht 5' (1.524 m)   Wt 183 lb 9.6 oz (83.3 kg)   LMP  (LMP Unknown)   SpO2 97%   BMI 35.86 kg/m    Physical Exam Vitals reviewed.  Constitutional:      General: She is not in acute distress.    Appearance: Normal appearance. She is well-developed. She is not diaphoretic.  HENT:     Head: Normocephalic and atraumatic.  Eyes:     General: No scleral icterus.     Conjunctiva/sclera: Conjunctivae normal.  Neck:     Thyroid: No thyromegaly.  Cardiovascular:     Rate and Rhythm: Normal rate and regular rhythm.     Heart sounds: Normal heart sounds. No murmur heard. Pulmonary:     Effort: Pulmonary effort is normal. No respiratory distress.     Breath sounds: Normal breath sounds. No wheezing, rhonchi or rales.  Musculoskeletal:     Cervical back: Neck supple.     Right lower leg: No edema.     Left lower leg: No edema.  Lymphadenopathy:     Cervical: No cervical adenopathy.  Skin:    General: Skin is warm and dry.     Findings: No rash.  Neurological:     Mental Status: She is alert and oriented to person, place, and time. Mental status is at baseline.  Psychiatric:        Mood and Affect: Mood normal.        Behavior: Behavior normal.      No results found for any visits on 05/03/23.    The 10-year ASCVD risk score (Arnett DK, et al., 2019) is: 44.2%    Assessment & Plan:   Problem List Items Addressed This Visit       Cardiovascular and Mediastinum   Hypertension associated with diabetes (HCC)    Uncontrolled despite Carvedilol 6.25mg  BID. Discussed the need for additional  antihypertensive medication. -Add Losartan 50mg  daily. -Recheck blood pressure in 1 month.      Relevant Medications   losartan (COZAAR) 50 MG tablet   Other Relevant Orders   Comprehensive metabolic panel     Endocrine   Hypothyroidism    On Synthroid daily. No recent thyroid function tests available. -Continue Synthroid daily. -Check TSH today.      Relevant Medications   levothyroxine (SYNTHROID) 50 MCG tablet   Other Relevant Orders   TSH   TSH   T2DM (type 2 diabetes mellitus) (HCC) - Primary    Poorly controlled with recent A1c of 11. Patient on Tresiba 36 units daily. Discussed the need for additional management strategies. -Continue Tresiba 36 units daily. -Consider adding oral hypoglycemic agents in future. -Check A1c at  next endocrinology appointment.      Relevant Medications   losartan (COZAAR) 50 MG tablet   Other Relevant Orders   Urine Microalbumin w/creat. ratio   Hyperlipidemia associated with type 2 diabetes mellitus (HCC)    Previously elevated Continue statin Repeat FLP and CMP Goal LDL < 70       Relevant Medications   losartan (COZAAR) 50 MG tablet   Other Relevant Orders   Comprehensive metabolic panel   Lipid panel     Other   Depression, major, single episode, mild (HCC)    Patient reports significant worry and anxiety. Currently on Lexapro 10mg  daily. -Continue Lexapro 10mg  daily. -Consider increasing dose or adding therapy if symptoms persist. - encourage therapy.      Morbid obesity (HCC)    BMI >35 and assoc with T2DM, HTN, HLD Discussed importance of healthy weight management Discussed diet and exercise       GAD (generalized anxiety disorder)    Patient reports significant worry and anxiety. Currently on Lexapro 10mg  daily. -Continue Lexapro 10mg  daily. -Consider increasing dose or adding therapy if symptoms persist. - encourage therapy.      Other Visit Diagnoses     Breast cancer screening by mammogram       Relevant Orders   MM 3D SCREENING MAMMOGRAM BILATERAL BREAST   Palpitations       Relevant Orders   CBC w/Diff/Platelet   LONG TERM MONITOR (3-14 DAYS)   Need for influenza vaccination       Relevant Orders   Flu Vaccine Trivalent High Dose (Fluad) (Completed)            Applied patients CGM and gave education today   Palpitations Patient reports intermittent palpitations, particularly when lying down. No clear precipitating factors identified. -Order Zio patch for 2-week continuous cardiac monitoring. -Check electrolytes, CBC, and thyroid function tests today.  General Health Maintenance / Followup Plans -Order mammogram (last done 1 year ago). -Order Cologuard test for colorectal cancer screening. -Administer flu shot  today. -Follow up in 1 month to reassess blood pressure control.        Return in about 4 weeks (around 05/31/2023) for BP f/u.    Shirlee Latch, MD

## 2023-05-03 NOTE — Assessment & Plan Note (Signed)
On Synthroid daily. No recent thyroid function tests available. -Continue Synthroid daily. -Check TSH today.

## 2023-05-03 NOTE — Assessment & Plan Note (Signed)
Poorly controlled with recent A1c of 11. Patient on Tresiba 36 units daily. Discussed the need for additional management strategies. -Continue Tresiba 36 units daily. -Consider adding oral hypoglycemic agents in future. -Check A1c at next endocrinology appointment.

## 2023-05-03 NOTE — Patient Instructions (Addendum)
Ask endocrinology - do you need to be on Jardiance or something else with your insulin?

## 2023-05-03 NOTE — Assessment & Plan Note (Signed)
Patient reports significant worry and anxiety. Currently on Lexapro 10mg  daily. -Continue Lexapro 10mg  daily. -Consider increasing dose or adding therapy if symptoms persist. - encourage therapy.

## 2023-05-03 NOTE — Assessment & Plan Note (Signed)
BMI >35 and assoc with T2DM, HTN, HLD Discussed importance of healthy weight management Discussed diet and exercise

## 2023-05-03 NOTE — Assessment & Plan Note (Signed)
Uncontrolled despite Carvedilol 6.25mg  BID. Discussed the need for additional antihypertensive medication. -Add Losartan 50mg  daily. -Recheck blood pressure in 1 month.

## 2023-05-08 DIAGNOSIS — R002 Palpitations: Secondary | ICD-10-CM | POA: Diagnosis not present

## 2023-05-12 ENCOUNTER — Encounter: Payer: Self-pay | Admitting: Family Medicine

## 2023-05-19 ENCOUNTER — Other Ambulatory Visit: Payer: Self-pay | Admitting: Family Medicine

## 2023-05-19 DIAGNOSIS — E1165 Type 2 diabetes mellitus with hyperglycemia: Secondary | ICD-10-CM | POA: Diagnosis not present

## 2023-05-19 DIAGNOSIS — R635 Abnormal weight gain: Secondary | ICD-10-CM | POA: Diagnosis not present

## 2023-05-19 DIAGNOSIS — H6993 Unspecified Eustachian tube disorder, bilateral: Secondary | ICD-10-CM

## 2023-05-19 DIAGNOSIS — M179 Osteoarthritis of knee, unspecified: Secondary | ICD-10-CM | POA: Diagnosis not present

## 2023-05-19 DIAGNOSIS — R4189 Other symptoms and signs involving cognitive functions and awareness: Secondary | ICD-10-CM | POA: Diagnosis not present

## 2023-05-19 DIAGNOSIS — E039 Hypothyroidism, unspecified: Secondary | ICD-10-CM | POA: Diagnosis not present

## 2023-05-19 NOTE — Telephone Encounter (Signed)
Requested Prescriptions  Pending Prescriptions Disp Refills   fluticasone (FLONASE) 50 MCG/ACT nasal spray [Pharmacy Med Name: FLUTICASONE NASAL SP (120) RX] 16 g 6    Sig: SHAKE LIQUID AND USE 2 SPRAYS IN EACH NOSTRIL DAILY     Ear, Nose, and Throat: Nasal Preparations - Corticosteroids Passed - 05/19/2023  8:09 AM      Passed - Valid encounter within last 12 months    Recent Outpatient Visits           2 weeks ago Type 2 diabetes mellitus with hyperglycemia, with long-term current use of insulin [facility])   [facility], Marzella Schlein, MD   3 months ago Acute bilateral low back pain without sciatica   [facility], Monico Blitz, DO   3 months ago Acute otitis externa of both ears, unspecified type   [facility] Malva Limes, MD   5 months ago Fall, initial encounter   [facility], Marzella Schlein, MD   8 months ago Acute non-recurrent pansinusitis   [facility], Marzella Schlein, MD       Future Appointments             In 1 week [place], Marzella Schlein, MD [facility]

## 2023-05-20 LAB — COMPREHENSIVE METABOLIC PANEL: eGFR: 87

## 2023-05-20 LAB — HEMOGLOBIN A1C: Hemoglobin A1C: 11.7

## 2023-05-20 LAB — TSH: TSH: 3.92 (ref 0.41–5.90)

## 2023-05-20 LAB — PROTEIN / CREATININE RATIO, URINE: Creatinine, Urine: 118

## 2023-05-20 LAB — MICROALBUMIN, URINE: Microalb, Ur: 3.12

## 2023-05-20 LAB — MICROALBUMIN / CREATININE URINE RATIO: Microalb Creat Ratio: 26.4

## 2023-05-24 DIAGNOSIS — R002 Palpitations: Secondary | ICD-10-CM | POA: Diagnosis not present

## 2023-05-31 ENCOUNTER — Ambulatory Visit: Payer: Medicare HMO | Admitting: Family Medicine

## 2023-06-09 ENCOUNTER — Ambulatory Visit: Payer: Medicare HMO | Admitting: Family Medicine

## 2023-06-09 ENCOUNTER — Encounter: Payer: Self-pay | Admitting: Family Medicine

## 2023-06-09 VITALS — BP 128/80 | HR 82 | Wt 186.0 lb

## 2023-06-09 DIAGNOSIS — I152 Hypertension secondary to endocrine disorders: Secondary | ICD-10-CM | POA: Diagnosis not present

## 2023-06-09 DIAGNOSIS — Z794 Long term (current) use of insulin: Secondary | ICD-10-CM

## 2023-06-09 DIAGNOSIS — E1159 Type 2 diabetes mellitus with other circulatory complications: Secondary | ICD-10-CM | POA: Diagnosis not present

## 2023-06-09 DIAGNOSIS — E1165 Type 2 diabetes mellitus with hyperglycemia: Secondary | ICD-10-CM

## 2023-06-09 DIAGNOSIS — E785 Hyperlipidemia, unspecified: Secondary | ICD-10-CM

## 2023-06-09 DIAGNOSIS — E1169 Type 2 diabetes mellitus with other specified complication: Secondary | ICD-10-CM | POA: Diagnosis not present

## 2023-06-09 NOTE — Assessment & Plan Note (Signed)
Managed with losartan 50 mg daily. Initial high reading in office, but subsequent manual reading was 128/80 mmHg, indicating good control. Discussed potential need for dose adjustment if home readings are consistently high. - Continue losartan 50 mg daily - Encourage home blood pressure monitoring

## 2023-06-09 NOTE — Assessment & Plan Note (Signed)
Type 2 Diabetes Mellitus with Poor Glycemic Control A1c of 11.7 indicating poor glycemic control. Currently on Tresiba insulin with adherence. Unclear if previously on glipizide; needs clarification with endocrinology. Discussed the importance of regular insulin administration and potential need for medication adjustment based on A1c levels. - Patient to call endocrinology to clarify current medications and discuss A1c of 11.7 - Ensure continuation of Tresiba insulin as prescribed - Encourage clarification of medication list with endocrinology

## 2023-06-09 NOTE — Assessment & Plan Note (Signed)
Cholesterol levels not available from recent labs. Needs to be checked. - Order cholesterol test today

## 2023-06-09 NOTE — Progress Notes (Signed)
Established Patient Office Visit  Subjective   Patient ID: Christine Clarke, female    DOB: 1950-11-06  Age: 72 y.o. MRN: [id number]  Chief Complaint  Patient presents with   Medical Management of Chronic Issues    Hypertension follow-up. Patient reports taking medication as prescribed with no symptoms to report.     HPI  Discussed the use of AI scribe software for clinical note transcription with the patient, who gave verbal consent to proceed.  History of Present Illness   The patient, with a history of diabetes, presents with an elevated A1c of 11.7, indicating poor glycemic control. Despite regular insulin use, the patient's blood sugar levels remain high. The patient reports adherence to the prescribed insulin regimen and has set reminders to ensure consistent administration. The patient's endocrinologist had previously increased the insulin dose, but the patient is unsure if further adjustments are needed.  The patient also reports taking losartan for blood pressure management and has been adhering to this medication regimen. Initial blood pressure readings were high, but subsequent readings were within normal limits.  The patient also mentions weight gain, which she attributes to dietary choices and possibly insulin use. The patient expresses a desire to lose weight.         ROS    Objective:     BP 128/80 (BP Location: Left Arm, Patient Position: Sitting, Cuff Size: Large)   Pulse 82   Wt 186 lb (84.4 kg)   LMP  (LMP Unknown)   SpO2 94%   BMI 36.33 kg/m    Physical Exam Vitals reviewed.  Constitutional:      General: She is not in acute distress.    Appearance: Normal appearance. She is well-developed. She is not diaphoretic.  HENT:     Head: Normocephalic and atraumatic.  Eyes:     General: No scleral icterus.    Conjunctiva/sclera: Conjunctivae normal.  Neck:     Thyroid: No thyromegaly.  Cardiovascular:     Rate and Rhythm: Normal rate and regular  rhythm.     Pulses: Normal pulses.     Heart sounds: Normal heart sounds. No murmur heard. Pulmonary:     Effort: Pulmonary effort is normal. No respiratory distress.     Breath sounds: Normal breath sounds. No wheezing, rhonchi or rales.  Musculoskeletal:     Cervical back: Neck supple.     Right lower leg: No edema.     Left lower leg: No edema.  Lymphadenopathy:     Cervical: No cervical adenopathy.  Skin:    General: Skin is warm and dry.     Findings: No rash.  Neurological:     Mental Status: She is alert and oriented to person, place, and time. Mental status is at baseline.  Psychiatric:        Mood and Affect: Mood normal.        Behavior: Behavior normal.      Results for orders placed or performed in visit on 06/09/23  Microalbumin, urine  Result Value Ref Range   Microalb, Ur 3.12   Microalbumin / creatinine urine ratio  Result Value Ref Range   Microalb Creat Ratio 26.4   Protein / creatinine ratio, urine  Result Value Ref Range   Creatinine, Urine 118   Comprehensive metabolic panel  Result Value Ref Range   eGFR 87   Hemoglobin A1c  Result Value Ref Range   Hemoglobin A1C 11.7   TSH  Result Value Ref Range  TSH 3.92 0.41 - 5.90      The 10-year ASCVD risk score (Arnett DK, et al., 2019) is: 33.5%    Assessment & Plan:   Problem List Items Addressed This Visit       Cardiovascular and Mediastinum   Hypertension associated with diabetes (HCC) - Primary     Endocrine   T2DM (type 2 diabetes mellitus) (HCC)   Hyperlipidemia associated with type 2 diabetes mellitus (HCC)   Relevant Orders   Lipid panel        General Health Maintenance Wellness visit scheduled in February. Discussed the importance of regular physical exams covered by Medicare. - Schedule a physical exam around the same time as the wellness visit in February  Follow-up - Follow up with endocrinology in a couple of months - Schedule next visit with primary care  physician in three months.        Return in about 15 weeks (around 09/22/2023) for CPE.    Shirlee Latch, MD

## 2023-06-10 LAB — LIPID PANEL
Chol/HDL Ratio: 3.7 ratio (ref 0.0–4.4)
Cholesterol, Total: 172 mg/dL (ref 100–199)
HDL: 47 mg/dL (ref >39)
LDL Chol Calc (NIH): 90 mg/dL (ref 0–99)
Triglycerides: 210 mg/dL — ABNORMAL HIGH (ref 0–149)
VLDL Cholesterol Cal: 35 mg/dL (ref 5–40)

## 2023-06-13 ENCOUNTER — Telehealth: Payer: Self-pay

## 2023-06-13 NOTE — Telephone Encounter (Signed)
Copied from CRM [id number]. Topic: General - Call Back - No Documentation >> Jun 10, 2023 11:55 AM Macon Large wrote: Reason for CRM: Pt reports that she was returning the call to [facility]. Cb# [phone]

## 2023-06-13 NOTE — Telephone Encounter (Signed)
Returned patient's call, [id number] is regarding her lab results.

## 2023-06-29 DIAGNOSIS — G514 Facial myokymia: Secondary | ICD-10-CM | POA: Diagnosis not present

## 2023-07-04 ENCOUNTER — Telehealth: Payer: Self-pay | Admitting: Family Medicine

## 2023-07-04 DIAGNOSIS — J301 Allergic rhinitis due to pollen: Secondary | ICD-10-CM

## 2023-07-04 MED ORDER — MONTELUKAST SODIUM 10 MG PO TABS: 10.0000 mg | ORAL_TABLET | Freq: Every day | ORAL | 3 refills | Status: DC

## 2023-07-04 NOTE — Telephone Encounter (Signed)
[facility] faxed refill request for the following medications:   montelukast (SINGULAIR) 10 MG tablet   Please advise.  

## 2023-07-04 NOTE — Telephone Encounter (Signed)
Refilled

## 2023-07-07 ENCOUNTER — Ambulatory Visit: Payer: Self-pay

## 2023-07-07 NOTE — Telephone Encounter (Signed)
Summary: Vertigo   Pt called reporting vertigo yesterday, wants to be seen for med refills  Best contact: [phone]     Chief Complaint: Has had vertigo in the past. Had vertigo yesterday, "better today." Prefers VV. Symptoms: Above, ears congested. Frequency: Yesterday Pertinent Negatives: Patient denies  Disposition: [] ED /[] Urgent Care (no appt availability in office) / [x] Appointment(In office/virtual)/ []  [facility]/ [] Home Care/ [] Refused Recommended Disposition /[] [facility]/ []  Follow-up with PCP Additional Notes: Agrees with appointment, will call back if symptoms worsen.  Reason for Disposition  [1] MODERATE dizziness (e.g., vertigo; feels very unsteady, interferes with normal activities) AND [2] has been evaluated by doctor (or NP/PA) for this  Answer Assessment - Initial Assessment Questions 1. DESCRIPTION: "Describe your dizziness."     Vertigo 2. VERTIGO: "Do you feel like either you or the room is spinning or tilting?"      Yes 3. LIGHTHEADED: "Do you feel lightheaded?" (e.g., somewhat faint, woozy, weak upon standing)     Woozy 4. SEVERITY: "How bad is it?"  "Can you walk?"   - MILD: Feels slightly dizzy and unsteady, but is walking normally.   - MODERATE: Feels unsteady when walking, but not falling; interferes with normal activities (e.g., school, work).   - SEVERE: Unable to walk without falling, or requires assistance to walk without falling.     Mild today 5. ONSET:  "When did the dizziness begin?"     Yesterday 6. AGGRAVATING FACTORS: "Does anything make it worse?" (e.g., standing, change in head position)     Moving 7. CAUSE: "What do you think is causing the dizziness?"     Vertigo 8. RECURRENT SYMPTOM: "Have you had dizziness before?" If Yes, ask: "When was the last time?" "What happened that time?"     Yes 9. OTHER SYMPTOMS: "Do you have any other symptoms?" (e.g., headache, weakness, numbness, vomiting, earache)     Ears  congested 10. PREGNANCY: "Is there any chance you are pregnant?" "When was your last menstrual period?"       No  Protocols used: Dizziness - Vertigo-A-AH

## 2023-07-08 ENCOUNTER — Encounter: Payer: Self-pay | Admitting: Family Medicine

## 2023-07-08 ENCOUNTER — Telehealth (INDEPENDENT_AMBULATORY_CARE_PROVIDER_SITE_OTHER): Payer: Medicare HMO | Admitting: Family Medicine

## 2023-07-08 DIAGNOSIS — J309 Allergic rhinitis, unspecified: Secondary | ICD-10-CM

## 2023-07-08 DIAGNOSIS — J301 Allergic rhinitis due to pollen: Secondary | ICD-10-CM | POA: Diagnosis not present

## 2023-07-08 DIAGNOSIS — R21 Rash and other nonspecific skin eruption: Secondary | ICD-10-CM | POA: Diagnosis not present

## 2023-07-08 DIAGNOSIS — H8110 Benign paroxysmal vertigo, unspecified ear: Secondary | ICD-10-CM

## 2023-07-08 DIAGNOSIS — E1165 Type 2 diabetes mellitus with hyperglycemia: Secondary | ICD-10-CM | POA: Diagnosis not present

## 2023-07-08 DIAGNOSIS — R0982 Postnasal drip: Secondary | ICD-10-CM

## 2023-07-08 MED ORDER — MECLIZINE HCL 25 MG PO TABS: 25.0000 mg | ORAL_TABLET | Freq: Three times a day (TID) | ORAL | 0 refills | Status: AC | PRN

## 2023-07-08 MED ORDER — TRIAMCINOLONE ACETONIDE 0.5 % EX OINT: 1.0000 | TOPICAL_OINTMENT | Freq: Two times a day (BID) | CUTANEOUS | 0 refills | Status: AC

## 2023-07-08 MED ORDER — CETIRIZINE HCL 10 MG PO TABS: 10.0000 mg | ORAL_TABLET | Freq: Every day | ORAL | 3 refills | Status: DC

## 2023-07-08 MED ORDER — AZELASTINE HCL 0.1 % NA SOLN: 2.0000 | Freq: Two times a day (BID) | NASAL | 2 refills | Status: AC

## 2023-07-08 NOTE — Progress Notes (Signed)
MyChart Video Visit    Virtual Visit via Video Note   This format is felt to be most appropriate for this patient at this time. Physical exam was limited by quality of the video and audio technology used for the visit.    Patient location: home Provider location: [facility] Persons involved in the visit: patient, provider  I discussed the limitations of evaluation and management by telemedicine and the availability of in person appointments. The patient expressed understanding and agreed to proceed.  Patient: Christine Clarke   DOB: July 24, 1951   72 y.o. Female  MRN: [id number] Visit Date: 07/08/2023  Today's healthcare provider: Shirlee Latch, MD   No chief complaint on file.  Subjective    HPI   Discussed the use of AI scribe software for clinical note transcription with the patient, who gave verbal consent to proceed.  History of Present Illness   The patient, with a history of allergies, presents with a recent episode of vertigo that occurred while shopping. The episode was characterized by dizziness, difficulty walking, and vomiting clear fluid. The patient recognized the symptoms as vertigo, having experienced it a couple of times in the past. The patient was able to rest and recover at home, and the vertigo has since resolved. The patient also reports a rash on the chest, which is red and bumpy. The patient has been using [place] on it. Additionally, the patient notes a change in the alignment of the toes on the right foot, with the second toe turning into the big toe. The patient denies any pain or discomfort from this. The patient also requests refills for allergy medications.         Review of Systems      Objective    LMP  (LMP Unknown)       Physical Exam Constitutional:      General: She is not in acute distress.    Appearance: Normal appearance.  HENT:     Head: Normocephalic.  Pulmonary:     Effort: Pulmonary effort is normal.  No respiratory distress.  Neurological:     Mental Status: She is alert and oriented to person, place, and time. Mental status is at baseline.     Rash on chest hard to visualized, but appears to be erythematous and maculopapular    Assessment & Plan     Problem List Items Addressed This Visit       Respiratory   Allergic rhinitis, seasonal   Relevant Medications   azelastine (ASTELIN) 0.1 % nasal spray   Other Visit Diagnoses     Benign paroxysmal positional vertigo, unspecified laterality    -  Primary   Allergic rhinitis with postnasal drip       Relevant Medications   cetirizine (ZYRTEC) 10 MG tablet   Rash               Vertigo Acute episode of vertigo on Wednesday with dizziness, inability to walk, and vomiting. Symptoms resolved. Likely benign paroxysmal positional vertigo (BPPV). Discussed Epley maneuver and prescribed meclizine for future episodes. - Send Epley maneuver information via MyChart - Prescribe meclizine as needed  Nasal Congestion Intermittent nasal congestion with clear discharge, likely due to seasonal changes. Occasional ear pain. Discussed nasal sprays for decongestion. - Refill Astelin nasal spray - Recommend continued use of Flonase if Astelin unavailable  Allergic Rhinitis Chronic allergic rhinitis managed with cetirizine. Out of medication. - Refill cetirizine 10 mg  Rash Red, bumpy rash  on chest, possibly due to irritation or allergic reaction. Itching around scalp with no visible signs. Discussed topical steroids for relief. 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 No pain but concerned about appearance. Discussed non-surgical options for comfort. - Recommend toe separators or corn pads to prevent rubbing  Follow-up - Ensure all prescriptions are sent to [facility] in [place] - Request expedited filling of prescriptions to coincide with  pickup of other medications.        Return if symptoms worsen or fail to improve.     I discussed the assessment and treatment plan with the patient. The patient was provided an opportunity to ask questions and all were answered. The patient agreed with the plan and demonstrated an understanding of the instructions.   The patient was advised to call back or seek an in-person evaluation if the symptoms worsen or if the condition fails to improve as anticipated.   Shirlee Latch, MD [facility] [phone] (phone) [phone] (fax)  [facility]

## 2023-07-15 DIAGNOSIS — M1711 Unilateral primary osteoarthritis, right knee: Secondary | ICD-10-CM | POA: Diagnosis not present

## 2023-08-01 ENCOUNTER — Other Ambulatory Visit: Payer: Self-pay | Admitting: Family Medicine

## 2023-08-01 DIAGNOSIS — E034 Atrophy of thyroid (acquired): Secondary | ICD-10-CM

## 2023-08-11 ENCOUNTER — Encounter: Payer: Self-pay | Admitting: Dermatology

## 2023-08-11 ENCOUNTER — Ambulatory Visit (INDEPENDENT_AMBULATORY_CARE_PROVIDER_SITE_OTHER): Payer: Medicare PPO | Admitting: Dermatology

## 2023-08-11 DIAGNOSIS — W908XXA Exposure to other nonionizing radiation, initial encounter: Secondary | ICD-10-CM | POA: Diagnosis not present

## 2023-08-11 DIAGNOSIS — I781 Nevus, non-neoplastic: Secondary | ICD-10-CM

## 2023-08-11 DIAGNOSIS — L578 Other skin changes due to chronic exposure to nonionizing radiation: Secondary | ICD-10-CM

## 2023-08-11 DIAGNOSIS — L821 Other seborrheic keratosis: Secondary | ICD-10-CM

## 2023-08-11 DIAGNOSIS — L853 Xerosis cutis: Secondary | ICD-10-CM | POA: Diagnosis not present

## 2023-08-11 DIAGNOSIS — L299 Pruritus, unspecified: Secondary | ICD-10-CM

## 2023-08-11 DIAGNOSIS — L570 Actinic keratosis: Secondary | ICD-10-CM

## 2023-08-11 DIAGNOSIS — L719 Rosacea, unspecified: Secondary | ICD-10-CM

## 2023-08-11 MED ORDER — METRONIDAZOLE 0.75 % EX CREA
TOPICAL_CREAM | Freq: Two times a day (BID) | CUTANEOUS | 3 refills | Status: AC
Start: 2023-08-11 — End: 2024-08-10

## 2023-08-11 NOTE — Patient Instructions (Addendum)
 Start using Cerave cream   Due to recent changes in healthcare laws, you may see results of your pathology and/or laboratory studies on MyChart before the doctors have had a chance to review them. We understand that in some cases there may be results that are confusing or concerning to you. Please understand that not all results are received at the same time and often the doctors may need to interpret multiple results in order to provide you with the best plan of care or course of treatment. Therefore, we ask that you please give us  2 business days to thoroughly review all your results before contacting the office for clarification. Should we see a critical lab result, you will be contacted sooner.   If You Need Anything After Your Visit  If you have any questions or concerns for your doctor, please call our main line at [phone] and press option 4 to reach your doctor's medical assistant. If no one answers, please leave a voicemail as directed and we will return your call as soon as possible. Messages left after 4 pm will be answered the following business day.   You may also send us  a message via MyChart. We typically respond to MyChart messages within 1-2 business days.  For prescription refills, please ask your pharmacy to contact our office. Our fax number is [phone].  If you have an urgent issue when the clinic is closed that cannot wait until the next business day, you can page your doctor at the number below.    Please note that while we do our best to be available for urgent issues outside of office hours, we are not available 24/7.   If you have an urgent issue and are unable to reach us , you may choose to seek medical care at your doctor's office, retail clinic, urgent care center, or emergency room.  If you have a medical emergency, please immediately call 911 or go to the emergency department.  Pager Numbers  - Dr. Hester: [phone]  - Dr. Jackquline:  [phone]  - Dr. Claudene: [phone]   In the event of inclement weather, please call our main line at [phone] for an update on the status of any delays or closures.  Dermatology Medication Tips: Please keep the boxes that topical medications come in in order to help keep track of the instructions about where and how to use these. Pharmacies typically print the medication instructions only on the boxes and not directly on the medication tubes.   If your medication is too expensive, please contact our office at [phone] option 4 or send us  a message through MyChart.   We are unable to tell what your co-pay for medications will be in advance as this is different depending on your insurance coverage. However, we may be able to find a substitute medication at lower cost or fill out paperwork to get insurance to cover a needed medication.   If a prior authorization is required to get your medication covered by your insurance company, please allow us  1-2 business days to complete this process.  Drug prices often vary depending on where the prescription is filled and some pharmacies may offer cheaper prices.  The website www.goodrx.com contains coupons for medications through different pharmacies. The prices here do not account for what the cost may be with help from insurance (it may be cheaper with your insurance), but the website can give you the price if you did not use any insurance.  - You can print  the associated coupon and take it with your prescription to the pharmacy.  - You may also stop by our office during regular business hours and pick up a GoodRx coupon card.  - If you need your prescription sent electronically to a different pharmacy, notify our office through [facility] or by phone at [phone] option 4.     Si Usted Necesita Algo Despus de Su Visita  Tambin puede enviarnos un mensaje a travs de Clinical Cytogeneticist. Por lo general respondemos a los mensajes de  MyChart en el transcurso de 1 a 2 das hbiles.  Para renovar recetas, por favor pida a su farmacia que se ponga en contacto con nuestra oficina. Randi lakes de fax es [place] [phone].  Si tiene un asunto urgente cuando la clnica est cerrada y que no puede esperar hasta el siguiente da hbil, puede llamar/localizar a su doctor(a) al nmero que aparece a continuacin.   Por favor, tenga en cuenta que aunque hacemos todo lo posible para estar disponibles para asuntos urgentes fuera del horario de [place], no estamos disponibles las 24 horas del da, los 7 [address]  [address] de la [place].   Si tiene un problema urgente y no puede comunicarse con nosotros, puede optar por buscar atencin mdica  en el consultorio de su doctor(a), en una clnica privada, en un centro de atencin urgente o en una sala de emergencias.  Si tiene engineer, drilling, por favor llame inmediatamente al 911 o vaya a la sala de emergencias.  Nmeros de bper  - Dr. Hester: [phone]  - Dra. Jackquline: [phone]  - Dr. Claudene: [phone]   En caso de inclemencias del tiempo, por favor llame a landry capes principal al [phone] para una actualizacin sobre el [place] de cualquier retraso o cierre.  Consejos para la medicacin en dermatologa: Por favor, guarde las cajas en las que vienen los medicamentos de uso tpico para ayudarle a seguir las instrucciones sobre dnde y cmo usarlos. Las farmacias generalmente imprimen las instrucciones del medicamento slo en las cajas y no directamente en los tubos del [place].   Si su medicamento es muy caro, por favor, pngase en contacto con landry rieger llamando al [phone] y presione la opcin 4 o envenos un mensaje a travs de Clinical Cytogeneticist.   No podemos decirle cul ser su copago por los medicamentos por adelantado ya que esto es diferente dependiendo de la cobertura de su seguro. Sin embargo, es posible que podamos encontrar un medicamento sustituto a advice worker un formulario para que el seguro cubra el medicamento que se considera necesario.   Si se requiere una autorizacin previa para que su compaa de seguros cubra su medicamento, por favor permtanos de 1 a 2 das hbiles para completar este proceso.  Los precios de los medicamentos varan con frecuencia dependiendo del environmental consultant de dnde se surte la receta y alguna farmacias pueden ofrecer precios ms baratos.  El sitio web www.goodrx.com tiene cupones para medicamentos de health and safety inspector. Los precios aqu no tienen en cuenta lo que podra costar con la ayuda del seguro (puede ser ms barato con su seguro), pero el sitio web puede darle el precio si no utiliz tourist information centre manager.  - Puede imprimir el cupn correspondiente y llevarlo con su receta a la farmacia.  - Tambin puede pasar por nuestra oficina durante el horario de atencin regular y education officer, museum una tarjeta de cupones de GoodRx.  - Si necesita que su receta se enve electrnicamente a una farmacia diferente, informe a nuestra  oficina a travs de MyChart de [place] o por telfono llamando al [phone] y presione la opcin 4.

## 2023-08-11 NOTE — Progress Notes (Signed)
   Follow Up Visit   Subjective  Christine Clarke is a 73 y.o. female who presents for the following: Rash on chest x3 weeks, at times itchy, tiny bumps, pt has been applying TMC 0.5%, pt concerned for spot on R arm x3 weeks has been using TMC 0.5%, R lower leg x3 weeks has not applied anything to. Pt also reports scalp has been itchy   The following portions of the chart were reviewed this encounter and updated as appropriate: medications, allergies, medical history  Review of Systems:  No other skin or systemic complaints except as noted in HPI or Assessment and Plan.  Objective  Well appearing patient in no apparent distress; mood and affect are within normal limits.  A focused examination was performed of the following areas: Face, chest, scalp, R arm, and legs  Relevant exam findings are noted in the Assessment and Plan.  R ante cubital fossa Erythematous macules with focal scale on right AC fossa  Assessment & Plan   ROSACEA   ACTINIC KERATOSIS R ante cubital fossa Discussed LN2 & biopsy, but patient declines at this time.  Start metronidazole  0.75% bid affected area R arm recheck on follow up  Actinic keratoses are precancerous spots that appear secondary to cumulative UV radiation exposure/sun exposure over time. They are chronic with expected duration over 1 year. A portion of actinic keratoses will progress to squamous cell carcinoma of the skin. It is not possible to reliably predict which spots will progress to skin cancer and so treatment is recommended to prevent development of skin cancer.  Recommend daily broad spectrum sunscreen SPF 30+ to sun-exposed areas, reapply every 2 hours as needed.  Recommend staying in the shade or wearing long sleeves, sun glasses (UVA+UVB protection) and wide brim hats (4-inch brim around the entire circumference of the hat). Call for new or changing lesions. 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  Treatment Plan Start metronidazole  cream 0.75% bid to chest Doxy 40 mg BID if not improving (per insurance coverage)  SEBORRHEIC KERATOSIS Legs - Stuck-on, waxy, tan-brown papules and/or plaques  - Benign-appearing - Discussed benign etiology and prognosis. - Observe - Call for any changes  Xerosis with pruritus Face -Recommend Cerave cream qd - diffuse xerotic patches - recommend gentle, hydrating skin care - gentle skin care handout given     Return in about 4 weeks (around 09/08/2023) for rocasea follow up.  I, Jacquelynn Vera, CMA, am acting as scribe for Boneta Sharps, MD .   Documentation: I have reviewed the above documentation for accuracy and completeness, and I agree with the above.  Boneta Sharps, MD

## 2023-08-15 ENCOUNTER — Telehealth: Payer: Self-pay

## 2023-08-15 NOTE — Telephone Encounter (Signed)
 Patient would like for you to review her photos and give a second opinion. She wasn't comfortable with Dr. Claudene and didn't understand why he gave her the option the treat the precancerous lesion with LN2 or biopsy. She said he was very nice, but she wasn't pleased with how he examined her or explained things. Please advise.

## 2023-08-16 ENCOUNTER — Ambulatory Visit: Payer: Medicare PPO | Admitting: Dermatology

## 2023-08-16 DIAGNOSIS — D229 Melanocytic nevi, unspecified: Secondary | ICD-10-CM

## 2023-08-16 DIAGNOSIS — L578 Other skin changes due to chronic exposure to nonionizing radiation: Secondary | ICD-10-CM | POA: Diagnosis not present

## 2023-08-16 DIAGNOSIS — W908XXA Exposure to other nonionizing radiation, initial encounter: Secondary | ICD-10-CM | POA: Diagnosis not present

## 2023-08-16 DIAGNOSIS — L539 Erythematous condition, unspecified: Secondary | ICD-10-CM | POA: Diagnosis not present

## 2023-08-16 DIAGNOSIS — L853 Xerosis cutis: Secondary | ICD-10-CM

## 2023-08-16 DIAGNOSIS — L821 Other seborrheic keratosis: Secondary | ICD-10-CM | POA: Diagnosis not present

## 2023-08-16 DIAGNOSIS — R21 Rash and other nonspecific skin eruption: Secondary | ICD-10-CM | POA: Diagnosis not present

## 2023-08-16 DIAGNOSIS — L820 Inflamed seborrheic keratosis: Secondary | ICD-10-CM

## 2023-08-16 NOTE — Telephone Encounter (Signed)
Discussed with patient and appointment scheduled.

## 2023-08-16 NOTE — Patient Instructions (Addendum)
 Stop using triamcinolone  0.5 % ointment / cream  Can use for bug bites or poison ivy   Do not recommend treatment for red healing scar area at right arm. Can use gentle moisturizer daily. Will recheck at next follow up  For chest  Start metronidazole  cream 0.75 % cream twice daily to chest  If too expensive do not buy can use otc moisturizer   Can use twice daily to chest and legs for rough and bump skin  Recommend starting moisturizer with exfoliant (Urea, Salicylic acid, or Lactic acid) one to two times daily to help smooth rough and bumpy skin.  OTC options include Cetaphil Rough and Bumpy lotion (Urea), Eucerin Roughness Relief lotion or spot treatment cream (Urea), CeraVe SA lotion/cream for Rough and Bumpy skin (Sal Acid), Gold Bond Rough and Bumpy cream (Sal Acid), and AmLactin 12% lotion/cream (Lactic Acid).  If applying in morning, also apply sunscreen to sun-exposed areas, since these exfoliating moisturizers can increase sensitivity to sun.     Gentle Skin Care Guide  1. Bathe no more than once a day.  2. Avoid bathing in hot water  3. Use a mild soap like Dove, Vanicream, Cetaphil, CeraVe. Can use Lever 2000 or Cetaphil antibacterial soap  4. Use soap only where you need it. On most days, use it under your arms, between your legs, and on your feet. Let the water rinse other areas unless visibly dirty.  5. When you get out of the bath/shower, use a towel to gently blot your skin dry, don't rub it.  6. While your skin is still a little damp, apply a moisturizing cream such as Vanicream, CeraVe, Cetaphil, Eucerin, Sarna lotion or plain Vaseline Jelly. For hands apply Neutrogena Norwegian Hand Cream or Excipial Hand Cream.  7. Reapply moisturizer any time you start to itch or feel dry.  8. Sometimes using free and clear laundry detergents can be helpful. Fabric softener sheets should be avoided. Downy Free & Gentle liquid, or any liquid fabric softener that is free of dyes and  perfumes, it acceptable to use  9. If your doctor has given you prescription creams you may apply moisturizers over them      Seborrheic Keratosis  What causes seborrheic keratoses? Seborrheic keratoses are harmless, common skin growths that first appear during adult life.  As time goes by, more growths appear.  Some people may develop a large number of them.  Seborrheic keratoses appear on both covered and uncovered body parts.  They are not caused by sunlight.  The tendency to develop seborrheic keratoses can be inherited.  They vary in color from skin-colored to gray, brown, or even black.  They can be either smooth or have a rough, warty surface.   Seborrheic keratoses are superficial and look as if they were stuck on the skin.  Under the microscope this type of keratosis looks like layers upon layers of skin.  That is why at times the top layer may seem to fall off, but the rest of the growth remains and re-grows.    Treatment Seborrheic keratoses do not need to be treated, but can easily be removed in the office.  Seborrheic keratoses often cause symptoms when they rub on clothing or jewelry.  Lesions can be in the way of shaving.  If they become inflamed, they can cause itching, soreness, or burning.  Removal of a seborrheic keratosis can be accomplished by freezing, burning, or surgery. If any spot bleeds, scabs, or grows rapidly, please return  to have it checked, as these can be an indication of a skin cancer.    Due to recent changes in healthcare laws, you may see results of your pathology and/or laboratory studies on MyChart before the doctors have had a chance to review them. We understand that in some cases there may be results that are confusing or concerning to you. Please understand that not all results are received at the same time and often the doctors may need to interpret multiple results in order to provide you with the best plan of care or course of treatment. Therefore, we  ask that you please give us  2 business days to thoroughly review all your results before contacting the office for clarification. Should we see a critical lab result, you will be contacted sooner.   If You Need Anything After Your Visit  If you have any questions or concerns for your doctor, please call our main line at [phone] and press option 4 to reach your doctor's medical assistant. If no one answers, please leave a voicemail as directed and we will return your call as soon as possible. Messages left after 4 pm will be answered the following business day.   You may also send us  a message via MyChart. We typically respond to MyChart messages within 1-2 business days.  For prescription refills, please ask your pharmacy to contact our office. Our fax number is [phone].  If you have an urgent issue when the clinic is closed that cannot wait until the next business day, you can page your doctor at the number below.    Please note that while we do our best to be available for urgent issues outside of office hours, we are not available 24/7.   If you have an urgent issue and are unable to reach us , you may choose to seek medical care at your doctor's office, retail clinic, urgent care center, or emergency room.  If you have a medical emergency, please immediately call 911 or go to the emergency department.  Pager Numbers  - Dr. Hester: [phone]  - Dr. Jackquline: [phone]  - Dr. Claudene: [phone]   In the event of inclement weather, please call our main line at [phone] for an update on the status of any delays or closures.  Dermatology Medication Tips: Please keep the boxes that topical medications come in in order to help keep track of the instructions about where and how to use these. Pharmacies typically print the medication instructions only on the boxes and not directly on the medication tubes.   If your medication is too expensive, please contact our office  at [phone] option 4 or send us  a message through MyChart.   We are unable to tell what your co-pay for medications will be in advance as this is different depending on your insurance coverage. However, we may be able to find a substitute medication at lower cost or fill out paperwork to get insurance to cover a needed medication.   If a prior authorization is required to get your medication covered by your insurance company, please allow us  1-2 business days to complete this process.  Drug prices often vary depending on where the prescription is filled and some pharmacies may offer cheaper prices.  The website www.goodrx.com contains coupons for medications through different pharmacies. The prices here do not account for what the cost may be with help from insurance (it may be cheaper with your insurance), but the website can give you the price  if you did not use any insurance.  - You can print the associated coupon and take it with your prescription to the pharmacy.  - You may also stop by our office during regular business hours and pick up a GoodRx coupon card.  - If you need your prescription sent electronically to a different pharmacy, notify our office through [facility] or by phone at [phone] option 4.     Si Usted Necesita Algo Despus de Su Visita  Tambin puede enviarnos un mensaje a travs de Clinical Cytogeneticist. Por lo general respondemos a los mensajes de MyChart en el transcurso de 1 a 2 das hbiles.  Para renovar recetas, por favor pida a su farmacia que se ponga en contacto con nuestra oficina. Randi lakes de fax es [place] [phone].  Si tiene un asunto urgente cuando la clnica est cerrada y que no puede esperar hasta el siguiente da hbil, puede llamar/localizar a su doctor(a) al nmero que aparece a continuacin.   Por favor, tenga en cuenta que aunque hacemos todo lo posible para estar disponibles para asuntos urgentes fuera del horario de [place], no estamos  disponibles las 24 horas del da, los 7 [address] de la [place].   Si tiene un problema urgente y no puede comunicarse con nosotros, puede optar por buscar atencin mdica  en el consultorio de su doctor(a), en una clnica privada, en un centro de atencin urgente o en una sala de emergencias.  Si tiene engineer, drilling, por favor llame inmediatamente al 911 o vaya a la sala de emergencias.  Nmeros de bper  - Dr. Hester: [phone]  - Dra. Jackquline: [phone]  - Dr. Claudene: [phone]   En caso de inclemencias del tiempo, por favor llame a landry capes principal al [phone] para una actualizacin sobre el [place] de cualquier retraso o cierre.  Consejos para la medicacin en dermatologa: Por favor, guarde las cajas en las que vienen los medicamentos de uso tpico para ayudarle a seguir las instrucciones sobre dnde y cmo usarlos. Las farmacias generalmente imprimen las instrucciones del medicamento slo en las cajas y no directamente en los tubos del [place].   Si su medicamento es muy caro, por favor, pngase en contacto con landry rieger llamando al [phone] y presione la opcin 4 o envenos un mensaje a travs de Clinical Cytogeneticist.   No podemos decirle cul ser su copago por los medicamentos por adelantado ya que esto es diferente dependiendo de la cobertura de su seguro. Sin embargo, es posible que podamos encontrar un medicamento sustituto a audiological scientist un formulario para que el seguro cubra el medicamento que se considera necesario.   Si se requiere una autorizacin previa para que su compaa de seguros cubra su medicamento, por favor permtanos de 1 a 2 das hbiles para completar este proceso.  Los precios de los medicamentos varan con frecuencia dependiendo del environmental consultant de dnde se surte la receta y alguna farmacias pueden ofrecer precios ms baratos.  El sitio web www.goodrx.com tiene cupones para medicamentos de health and safety inspector. Los precios aqu no  tienen en cuenta lo que podra costar con la ayuda del seguro (puede ser ms barato con su seguro), pero el sitio web puede darle el precio si no utiliz tourist information centre manager.  - Puede imprimir el cupn correspondiente y llevarlo con su receta a la farmacia.  - Tambin puede pasar por nuestra oficina durante el horario de atencin regular y education officer, museum una tarjeta de cupones de GoodRx.  - Si necesita que  su receta se enve electrnicamente a una farmacia diferente, informe a nuestra oficina a travs de MyChart de [place] o por telfono llamando al [phone] y presione la opcin 4.

## 2023-08-16 NOTE — Progress Notes (Signed)
 Follow-Up Visit   Subjective  Christine Clarke is a 73 y.o. female who presents for the following:  Patient here today concerning spot at right arm that she had checked in November also some spots at legs and chest. Patient given rx of metronidazole  to use at chest for possible rosacea (pt reports getting small pimple like blisters- not itchy which have cleared up) but reports she has not started rx yet. Currently using [facility] 0.5% ointment on spot at right arm and chest for 3 - 4 weeks but has noticed areas are staying red. Was more scaly on arm but now smooth, had bumps on chest which have cleared up. Rx was prescribed by primary care.      The patient has spots, moles and lesions to be evaluated, some may be new or changing and the patient may have concern these could be cancer.   The following portions of the chart were reviewed this encounter and updated as appropriate: medications, allergies, medical history  Review of Systems:  No other skin or systemic complaints except as noted in HPI or Assessment and Plan.  Objective  Well appearing patient in no apparent distress; mood and affect are within normal limits.   A focused examination was performed of the following areas: Legs, chest, right arm , left arm   Relevant exam findings are noted in the Assessment and Plan.    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 Bumps have cleared   Treatment Plan Start metronidazole  cream 0.75% bid to chest prn flares (pimple like bumps)  Can use Recommend starting moisturizer with exfoliant (Urea, Salicylic acid, or Lactic acid) one to two times daily to help smooth rough and bumpy skin.  OTC options include Cetaphil Rough and Bumpy lotion (Urea), Eucerin Roughness Relief lotion or spot treatment cream (Urea), CeraVe SA lotion/cream for Rough and  Bumpy skin (Sal Acid), Gold Bond Rough and Bumpy cream (Sal Acid), and AmLactin 12% lotion/cream (Lactic Acid).  If applying in morning, also apply sunscreen to sun-exposed areas, since these exfoliating moisturizers can increase sensitivity to sun.  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  - Recommend staying in the shade or wearing long sleeves, sun glasses (UVA+UVB protection) and wide brim hats (4-inch brim around the entire circumference of the hat). - Call for new or changing lesions.   Probable resolving ISK right antecubital fossa  Exam: bright pink macule, no scale or induration. Benign features under dermoscopy.  Slight fading when compared to previous photo.  Treatment Plan: Benign-appearing.  Observation.  Call clinic for new or changing lesions.  Recommend daily use of broad spectrum spf 30+ sunscreen to sun-exposed areas.   Do not recommend treatment at this time. Will continue to monitor.  Pt returns to clinic in 6 weeks and will recheck at that time.  Xerosis - diffuse xerotic patches - recommend gentle, hydrating skin care - gentle skin care handout given  MELANOCYTIC NEVI Exam: Tan-brown and/or pink-flesh-colored symmetric macules and papules  Treatment Plan: Benign appearing on exam today. Recommend observation. Call clinic for new or changing moles. Recommend daily use of broad spectrum spf 30+ sunscreen to sun-exposed areas.    SEBORRHEIC KERATOSIS  At legs  - Stuck-on, waxy, tan-brown papules and/or plaques  - Benign-appearing - Discussed benign etiology and prognosis. - Observe - Call for any changes     Return for keep follow up as schedule in march .  I, Eleanor Blush, CMA, am acting as scribe for Rexene Rattler,  MD.   Documentation: I have reviewed the above documentation for accuracy and completeness, and I agree with the above.  Rexene Rattler, MD

## 2023-08-26 ENCOUNTER — Other Ambulatory Visit: Payer: Self-pay | Admitting: Physician Assistant

## 2023-08-26 DIAGNOSIS — R1012 Left upper quadrant pain: Secondary | ICD-10-CM

## 2023-08-29 NOTE — Telephone Encounter (Signed)
Requested Prescriptions  Pending Prescriptions Disp Refills   omeprazole (PRILOSEC) 20 MG capsule [Pharmacy Med Name: OMEPRAZOLE 20MG  CAPSULES] 90 capsule 1    Sig: TAKE 1 CAPSULE(20 MG) BY MOUTH DAILY IN THE MORNING ON AN EMPTY STOMACH     Gastroenterology: Proton Pump Inhibitors Passed - 08/29/2023  7:32 AM      Passed - Valid encounter within last 12 months    Recent Outpatient Visits           1 month ago Benign paroxysmal positional vertigo, unspecified laterality   [place] [facility] [place], Marzella Schlein, MD   2 months ago Hypertension associated with diabetes [facility])   [place] [facility] [place], Marzella Schlein, MD   3 months ago Type 2 diabetes mellitus with hyperglycemia, with long-term current use of insulin [facility])   [place] [facility] [place], Marzella Schlein, MD   6 months ago Acute bilateral low back pain without sciatica   [place] [facility] [place], Monico Blitz, DO   7 months ago Acute otitis externa of both ears, unspecified type   [facility] Malva Limes, MD       Future Appointments             In 4 weeks [place], Marzella Schlein, MD [facility]   In 1 month Willeen Niece, MD [facility]

## 2023-09-22 ENCOUNTER — Ambulatory Visit: Payer: Medicare PPO | Admitting: Dermatology

## 2023-09-27 ENCOUNTER — Encounter: Payer: Self-pay | Admitting: Family Medicine

## 2023-09-27 ENCOUNTER — Ambulatory Visit (INDEPENDENT_AMBULATORY_CARE_PROVIDER_SITE_OTHER): Payer: Medicare PPO

## 2023-09-27 ENCOUNTER — Ambulatory Visit (INDEPENDENT_AMBULATORY_CARE_PROVIDER_SITE_OTHER): Payer: Medicare PPO | Admitting: Family Medicine

## 2023-09-27 VITALS — BP 132/80 | Ht 60.0 in | Wt 188.6 lb

## 2023-09-27 VITALS — BP 173/73 | HR 72 | Ht 60.0 in | Wt 188.6 lb

## 2023-09-27 DIAGNOSIS — Z780 Asymptomatic menopausal state: Secondary | ICD-10-CM

## 2023-09-27 DIAGNOSIS — Z794 Long term (current) use of insulin: Secondary | ICD-10-CM

## 2023-09-27 DIAGNOSIS — Z0000 Encounter for general adult medical examination without abnormal findings: Secondary | ICD-10-CM

## 2023-09-27 DIAGNOSIS — F411 Generalized anxiety disorder: Secondary | ICD-10-CM | POA: Diagnosis not present

## 2023-09-27 DIAGNOSIS — E559 Vitamin D deficiency, unspecified: Secondary | ICD-10-CM | POA: Diagnosis not present

## 2023-09-27 DIAGNOSIS — E1165 Type 2 diabetes mellitus with hyperglycemia: Secondary | ICD-10-CM

## 2023-09-27 DIAGNOSIS — Z0001 Encounter for general adult medical examination with abnormal findings: Secondary | ICD-10-CM | POA: Diagnosis not present

## 2023-09-27 DIAGNOSIS — F320 Major depressive disorder, single episode, mild: Secondary | ICD-10-CM | POA: Diagnosis not present

## 2023-09-27 DIAGNOSIS — E039 Hypothyroidism, unspecified: Secondary | ICD-10-CM | POA: Diagnosis not present

## 2023-09-27 DIAGNOSIS — I152 Hypertension secondary to endocrine disorders: Secondary | ICD-10-CM | POA: Diagnosis not present

## 2023-09-27 DIAGNOSIS — E1159 Type 2 diabetes mellitus with other circulatory complications: Secondary | ICD-10-CM

## 2023-09-27 DIAGNOSIS — R400 Somnolence: Secondary | ICD-10-CM

## 2023-09-27 DIAGNOSIS — E1169 Type 2 diabetes mellitus with other specified complication: Secondary | ICD-10-CM

## 2023-09-27 DIAGNOSIS — E785 Hyperlipidemia, unspecified: Secondary | ICD-10-CM | POA: Diagnosis not present

## 2023-09-27 MED ORDER — TIRZEPATIDE 2.5 MG/0.5ML ~~LOC~~ SOAJ: 2.5000 mg | SUBCUTANEOUS | 0 refills | Status: DC

## 2023-09-27 MED ORDER — TIRZEPATIDE 5 MG/0.5ML ~~LOC~~ SOAJ: 5.0000 mg | SUBCUTANEOUS | 0 refills | Status: DC

## 2023-09-27 NOTE — Patient Instructions (Addendum)
 Ms. Lewey , Thank you for taking time to come for your Medicare Wellness Visit. I appreciate your ongoing commitment to your health goals. Please review the following plan we discussed and let me know if I can assist you in the future.   Referrals/Orders/Follow-Ups/Clinician Recommendations: MADE REFERRAL FOR BONE DENSITY SCAN  This is a list of the screening recommended for you and due dates:  Health Maintenance  Topic Date Due   Cologuard (Stool DNA test)  Never done   COVID-19 Vaccine (3 - Pfizer risk series) 08/15/2020   DTaP/Tdap/Td vaccine (2 - Td or Tdap) 01/27/2022   Mammogram  04/15/2023   Eye exam for diabetics  11/12/2023   Hemoglobin A1C  11/18/2023   Complete foot exam   05/02/2024   Yearly kidney function blood test for diabetes  05/19/2024   Yearly kidney health urinalysis for diabetes  05/19/2024   Medicare Annual Wellness Visit  09/26/2024   Pneumonia Vaccine  Completed   Flu Shot  Completed   DEXA scan (bone density measurement)  Completed   Hepatitis C Screening  Completed   Zoster (Shingles) Vaccine  Completed   HPV Vaccine  Aged Out   Colon Cancer Screening  Discontinued    Advanced directives: (ACP Link)Information on Advanced Care Planning can be found at [facility] Advance Health Care Directives Advance Health Care Directives ([email or web address]   Next Medicare Annual Wellness Visit scheduled for next year: Yes   10/02/24 @ 8:50 IN PERSON

## 2023-09-27 NOTE — Patient Instructions (Signed)
 Consider asking at the pharmacy about the tetanus shot (TDAP)  Call [facility] to schedule a mammogram [phone]

## 2023-09-27 NOTE — Progress Notes (Signed)
 Complete physical exam   Patient: Christine Clarke   DOB: [birth year]   73 y.o. Female  MRN: [id number] Visit Date: 09/27/2023  Today's healthcare provider: Shirlee Latch, MD   Chief Complaint  Patient presents with   Annual Exam    Last completed - AWV today Diet -  general Exercise - none Feeling - poorly due to sleeping too much Sleeping - well Concerns -  sleeping too much, practically sleeping all day   Care Management    Mammogram ordered 05/2023 Dexa ordered today  Cologuard - advised by [facility] no longer needed now   Subjective    Christine Clarke is a 73 y.o. female who presents today for a complete physical exam.   Discussed the use of AI scribe software for clinical note transcription with the patient, who gave verbal consent to proceed.  History of Present Illness   The patient, with a history of diabetes, hypertension, and vertigo, presents with excessive sleepiness, balance issues, and a history of falls. The patient reports sleeping excessively, sometimes all day, for the past few weeks. This has been frustrating for the patient as it interferes with her daily activities. The patient also reports balance issues, feeling as if she is being pushed forward when walking, and has fallen several times. The patient is currently using a cane for mobility.  The patient's diabetes has been poorly controlled with an A1c over eleven. The patient is considering changing endocrinologists and is open to adding a GLP-1 like Mounjaro or Ozempic to her current insulin regimen for better control and potential weight loss.  The patient also mentions a skin issue that has been treated by a dermatologist, but the patient is not satisfied with the treatment. The patient also has a concern about a toenail, but is unsure if she should see a podiatrist.  The patient is due for a mammogram and bone density test, and is considering a colonoscopy or Cologuard for colon cancer screening. The  patient also needs a tetanus shot, which she plans to get at the pharmacy.         09/27/2023    9:23 AM 06/09/2023   11:11 AM 05/03/2023   10:51 AM 11/30/2022   10:14 AM 09/08/2022    3:29 PM  Depression screen PHQ 2/9  Decreased Interest 2 1 1 1 1   Down, Depressed, Hopeless 2 1 1 1 1   PHQ - 2 Score 4 2 2 2 2   Altered sleeping 1 1 1  0 1  Tired, decreased energy 2 1 1 1 1   Change in appetite 0 1 1 1 1   Feeling bad or failure about yourself  0 0 0 0 0  Trouble concentrating 0 0 1 0 0  Moving slowly or fidgety/restless 0 0 0 0 0  Suicidal thoughts 0 0 0 0 0  PHQ-9 Score 7 5 6 4 5   Difficult doing work/chores Not difficult at all Somewhat difficult  Not difficult at all Somewhat difficult      09/27/2023   10:28 AM 06/09/2023   11:11 AM 05/03/2023   10:51 AM 12/07/2016   11:40 AM  GAD 7 : Generalized Anxiety Score  Nervous, Anxious, on Edge 1 1 1 3   Control/stop worrying 1 1 1 2   Worry too much - different things 1 1 1 2   Trouble relaxing 1 1 1 3   Restless 0 0 0 2  Easily annoyed or irritable 0 0 1 3  Afraid - awful might  happen 0 0 0 1  Total GAD 7 Score 4 4 5 16   Anxiety Difficulty Somewhat difficult   Very difficult    Last fall risk screening    09/27/2023    9:29 AM  Fall Risk   Falls in the past year? 1  Number falls in past yr: 1  Injury with Fall? 0  Risk for fall due to : Impaired balance/gait  Follow up Falls evaluation completed;Falls prevention discussed        Medications: Outpatient Medications Prior to Visit  Medication Sig   azelastine (ASTELIN) 0.1 % nasal spray Place 2 sprays into both nostrils 2 (two) times daily. Use in each nostril as directed (Patient not taking: Reported on 09/27/2023)   B-D UF III MINI PEN NEEDLES 31G X 5 MM MISC daily. as directed   Blood Glucose Monitoring Suppl (CONTOUR NEXT ONE) KIT 1 kit by Does not apply route daily. To check blood sugar daily   BOTOX 100 units SOLR injection    carvedilol (COREG) 6.25 MG tablet Take 6.25  mg by mouth 2 (two) times daily.   cetirizine (ZYRTEC) 10 MG tablet Take 1 tablet (10 mg total) by mouth daily.   escitalopram (LEXAPRO) 10 MG tablet Take 1 tablet (10 mg total) by mouth at bedtime.   fluticasone (FLONASE) 50 MCG/ACT nasal spray SHAKE LIQUID AND USE 2 SPRAYS IN EACH NOSTRIL DAILY   glucose blood (CONTOUR NEXT TEST) test strip To check blood sugar daily   Lancets MISC To check blood sugar daily   levothyroxine (SYNTHROID) 50 MCG tablet Take 1 tablet (50 mcg total) by mouth daily before breakfast.   losartan (COZAAR) 50 MG tablet Take 1 tablet (50 mg total) by mouth daily.   meclizine (ANTIVERT) 25 MG tablet Take 1 tablet (25 mg total) by mouth 3 (three) times daily as needed.   meloxicam (MOBIC) 15 MG tablet Take 1 tablet (15 mg total) by mouth daily.   metroNIDAZOLE (METROCREAM) 0.75 % cream Apply topically 2 (two) times daily. Bid to chest and right arm for rosacea   mometasone (ELOCON) 0.1 % lotion APPLY 2 TO 4 DROPS TO EACH EAR EVERY NIGHT AT BEDTIME FOR 5 DAYS THEN AS NEEDED FOR DRY AND ITCHY SKIN AS DIRECTED   montelukast (SINGULAIR) 10 MG tablet Take 1 tablet (10 mg total) by mouth daily.   omeprazole (PRILOSEC) 20 MG capsule TAKE 1 CAPSULE(20 MG) BY MOUTH DAILY IN THE MORNING ON AN EMPTY STOMACH   simvastatin (ZOCOR) 40 MG tablet TAKE 1 TABLET(40 MG) BY MOUTH AT BEDTIME   TRESIBA FLEXTOUCH 100 UNIT/ML FlexTouch Pen Inject 36 Units into the skin daily.   triamcinolone ointment (KENALOG) 0.5 % Apply 1 Application topically 2 (two) times daily.   No facility-administered medications prior to visit.    Review of Systems    Objective    BP (!) 173/73 (BP Location: Left Arm, Patient Position: Sitting, Cuff Size: Large)   Pulse 72   Ht 5' (1.524 m)   Wt 188 lb 9.6 oz (85.5 kg)   LMP  (LMP Unknown)   SpO2 99%   BMI 36.83 kg/m    Physical Exam Vitals reviewed.  Constitutional:      General: She is not in acute distress.    Appearance: Normal appearance. She is  well-developed. She is not diaphoretic.  HENT:     Head: Normocephalic and atraumatic.     Right Ear: Ear canal and external ear normal. There is impacted cerumen.  Left Ear: Tympanic membrane, ear canal and external ear normal.     Nose: Nose normal.     Mouth/Throat:     Mouth: Mucous membranes are moist.     Pharynx: Oropharynx is clear. No oropharyngeal exudate.  Eyes:     General: No scleral icterus.    Conjunctiva/sclera: Conjunctivae normal.     Pupils: Pupils are equal, round, and reactive to light.  Neck:     Thyroid: No thyromegaly.  Cardiovascular:     Rate and Rhythm: Normal rate and regular rhythm.     Heart sounds: Normal heart sounds. No murmur heard. Pulmonary:     Effort: Pulmonary effort is normal. No respiratory distress.     Breath sounds: Normal breath sounds. No wheezing or rales.  Abdominal:     General: There is no distension.     Palpations: Abdomen is soft.     Tenderness: There is no abdominal tenderness.  Musculoskeletal:        General: No deformity.     Cervical back: Neck supple.     Right lower leg: No edema.     Left lower leg: No edema.  Lymphadenopathy:     Cervical: No cervical adenopathy.  Skin:    General: Skin is warm and dry.     Findings: No rash.  Neurological:     Mental Status: She is alert and oriented to person, place, and time. Mental status is at baseline.     Gait: Gait normal.  Psychiatric:        Mood and Affect: Mood normal.        Behavior: Behavior normal.        Thought Content: Thought content normal.      No results found for any visits on 09/27/23.  Assessment & Plan    Routine Health Maintenance and Physical Exam  Exercise Activities and Dietary recommendations  Goals      DIET - EAT MORE FRUITS AND VEGETABLES     DIET - INCREASE WATER INTAKE        Immunization History  Administered Date(s) Administered   Fluad Quad(high Dose 65+) 04/24/2019, 05/21/2020, 05/22/2021, 05/19/2022   Fluad  Trivalent(High Dose 65+) 05/03/2023   PFIZER Comirnaty(Gray Top)Covid-19 Tri-Sucrose Vaccine 10/10/2019, 07/18/2020   Pneumococcal Conjugate-13 06/20/2014   Pneumococcal Polysaccharide-23 11/18/2015   Tdap 01/28/2012   Zoster Recombinant(Shingrix) 04/24/2019, 06/26/2019   Zoster, Live 01/28/2012    Health Maintenance  Topic Date Due   Fecal DNA (Cologuard)  Never done   COVID-19 Vaccine (3 - Pfizer risk series) 08/15/2020   DTaP/Tdap/Td (2 - Td or Tdap) 01/27/2022   MAMMOGRAM  04/15/2023   OPHTHALMOLOGY EXAM  11/12/2023   HEMOGLOBIN A1C  11/18/2023   FOOT EXAM  05/02/2024   Diabetic kidney evaluation - eGFR measurement  05/19/2024   Diabetic kidney evaluation - Urine ACR  05/19/2024   Medicare Annual Wellness (AWV)  09/26/2024   Pneumonia Vaccine 85+ Years old  Completed   INFLUENZA VACCINE  Completed   DEXA SCAN  Completed   Hepatitis C Screening  Completed   Zoster Vaccines- Shingrix  Completed   HPV VACCINES  Aged Out   Colonoscopy  Discontinued    Discussed health benefits of physical activity, and encouraged her to engage in regular exercise appropriate for her age and condition.  Problem List Items Addressed This Visit       Cardiovascular and Mediastinum   Hypertension associated with diabetes (HCC)   Relevant Medications   tirzepatide (  MOUNJARO) 2.5 MG/0.5ML Pen   tirzepatide (MOUNJARO) 5 MG/0.5ML Pen   Other Relevant Orders   Comprehensive metabolic panel     Endocrine   Hypothyroidism   Relevant Orders   TSH   T2DM (type 2 diabetes mellitus) (HCC)   Relevant Medications   tirzepatide (MOUNJARO) 2.5 MG/0.5ML Pen   tirzepatide (MOUNJARO) 5 MG/0.5ML Pen   Other Relevant Orders   Hemoglobin A1c   Ambulatory referral to Endocrinology   Hyperlipidemia associated with type 2 diabetes mellitus (HCC)   Relevant Medications   tirzepatide (MOUNJARO) 2.5 MG/0.5ML Pen   tirzepatide (MOUNJARO) 5 MG/0.5ML Pen   Other Relevant Orders   Comprehensive metabolic  panel   Lipid panel     Other   Vitamin D deficiency   Relevant Orders   VITAMIN D 25 Hydroxy (Vit-D Deficiency, Fractures)   Depression, major, single episode, mild (HCC)   Daytime sleepiness   Relevant Orders   CBC w/Diff/Platelet   Ambulatory referral to Sleep Studies   Morbid obesity (HCC)   Relevant Medications   tirzepatide (MOUNJARO) 2.5 MG/0.5ML Pen   tirzepatide (MOUNJARO) 5 MG/0.5ML Pen   GAD (generalized anxiety disorder)   Other Visit Diagnoses       Encounter for annual physical exam    -  Primary           Excessive Daytime Sleepiness Reports excessive daytime sleepiness, sleeping all day for four to five days in the past few weeks. Differential includes uncontrolled diabetes, thyroid dysfunction, vitamin D deficiency, anemia, and sleep apnea. Discussed need for lab work and potential sleep study. - Order lab work: A1c, thyroid function, vitamin D levels, CBC, kidney and liver function - Refer for in-lab sleep study  Type 2 Diabetes Mellitus A1c remains elevated over 11%. Falls and balance issues hinder exercise. Discussed adding Mounjaro (tirzepatide) to current insulin regimen for weight loss and A1c reduction. Explained Greggory Keen is a weekly injection, not insulin, starting at 2.5 mg weekly for one month, then increasing to 5 mg weekly. - Start Mounjaro 2.5 mg weekly for one month, then increase to 5 mg weekly - Refer to [facility] for ongoing diabetes management - Check A1c today  Balance Issues and Fall Risk Reports multiple falls and balance issues. Uses a cane but unsure of proper usage. No physical therapy for knee or balance issues. Discussed benefits of physical therapy for balance training and cane usage assessment. Prefers physical therapy at Dr. Odis Luster' office. - Refer to physical therapy for balance training and cane usage assessment - patient declines and wants to do PT at Dr Odis Luster office - Inform Dr. Odis Luster of balance  issues  Hypertension Blood pressure remains elevated despite increased medication. Discussed need to monitor blood pressure closely and recheck today. - Recheck blood pressure today - Monitor blood pressure closely  Ear Discomfort Reports ear discomfort, possible ear wax buildup. Discussed using over-the-counter Debrox for ear wax removal. - Recommend over-the-counter Debrox for ear wax removal  General Health Maintenance Due for routine screenings and vaccinations. Prefers to complete Cologuard test at home but will check expiration date of current kit. - Schedule mammogram - Schedule bone density scan - Complete Cologuard test at home or order new kit if expired - Get tetanus shot at pharmacy  Follow-up - Follow up in three months unless seen by endocrinology - Set follow-up appointment with [facility] - Set follow-up appointment with podiatrist for toenail issues.          Return in about  3 months (around 12/25/2023) for chronic disease f/u.     Shirlee Latch, MD  [facility] [phone] (phone) [phone] (fax)  [facility]

## 2023-09-27 NOTE — Progress Notes (Signed)
 Subjective:   Christine Clarke is a 73 y.o. who presents for a Medicare Wellness preventive visit.  Visit Complete: In person  AWV Questionnaire: No: Patient Medicare AWV questionnaire was not completed prior to this visit.  Cardiac Risk Factors include: advanced age (>51men, >7 women);hypertension;dyslipidemia;diabetes mellitus;obesity (BMI >30kg/m2);sedentary lifestyle     Objective:    Today's Vitals   09/27/23 0906 09/27/23 0949  BP: (!) 150/88 132/80  Weight: 188 lb 9.6 oz (85.5 kg)   Height: 5' (1.524 m)    Body mass index is 36.83 kg/m.     09/27/2023    9:27 AM 09/08/2022    3:34 PM 08/30/2022    3:13 PM 09/07/2021    3:13 PM 11/18/2015    3:03 PM  Advanced Directives  Does Patient Have a Medical Advance Directive? No No No No No  Would patient like information on creating a medical advance directive? No - Patient declined Yes (ED - Information included in AVS) Yes (MAU/Ambulatory/Procedural Areas - Information given) No - Patient declined     Current Medications (verified) Outpatient Encounter Medications as of 09/27/2023  Medication Sig   B-D UF III MINI PEN NEEDLES 31G X 5 MM MISC daily. as directed   Blood Glucose Monitoring Suppl (CONTOUR NEXT ONE) KIT 1 kit by Does not apply route daily. To check blood sugar daily   BOTOX 100 units SOLR injection    carvedilol (COREG) 6.25 MG tablet Take 6.25 mg by mouth 2 (two) times daily.   cetirizine (ZYRTEC) 10 MG tablet Take 1 tablet (10 mg total) by mouth daily.   escitalopram (LEXAPRO) 10 MG tablet Take 1 tablet (10 mg total) by mouth at bedtime.   fluticasone (FLONASE) 50 MCG/ACT nasal spray SHAKE LIQUID AND USE 2 SPRAYS IN EACH NOSTRIL DAILY   glucose blood (CONTOUR NEXT TEST) test strip To check blood sugar daily   Lancets MISC To check blood sugar daily   levothyroxine (SYNTHROID) 50 MCG tablet Take 1 tablet (50 mcg total) by mouth daily before breakfast.   losartan (COZAAR) 50 MG tablet Take 1 tablet (50 mg total)  by mouth daily.   meclizine (ANTIVERT) 25 MG tablet Take 1 tablet (25 mg total) by mouth 3 (three) times daily as needed.   meloxicam (MOBIC) 15 MG tablet Take 1 tablet (15 mg total) by mouth daily.   metroNIDAZOLE (METROCREAM) 0.75 % cream Apply topically 2 (two) times daily. Bid to chest and right arm for rosacea   mometasone (ELOCON) 0.1 % lotion APPLY 2 TO 4 DROPS TO EACH EAR EVERY NIGHT AT BEDTIME FOR 5 DAYS THEN AS NEEDED FOR DRY AND ITCHY SKIN AS DIRECTED   montelukast (SINGULAIR) 10 MG tablet Take 1 tablet (10 mg total) by mouth daily.   omeprazole (PRILOSEC) 20 MG capsule TAKE 1 CAPSULE(20 MG) BY MOUTH DAILY IN THE MORNING ON AN EMPTY STOMACH   simvastatin (ZOCOR) 40 MG tablet TAKE 1 TABLET(40 MG) BY MOUTH AT BEDTIME   TRESIBA FLEXTOUCH 100 UNIT/ML FlexTouch Pen Inject 36 Units into the skin daily.   triamcinolone ointment (KENALOG) 0.5 % Apply 1 Application topically 2 (two) times daily.   azelastine (ASTELIN) 0.1 % nasal spray Place 2 sprays into both nostrils 2 (two) times daily. Use in each nostril as directed (Patient not taking: Reported on 09/27/2023)   No facility-administered encounter medications on file as of 09/27/2023.    Allergies (verified) Sulfa antibiotics and Metformin and related   History: Past Medical History:  Diagnosis Date  Allergy    Diabetes mellitus without complication (HCC)    Hyperlipidemia    Hypertension    Past Surgical History:  Procedure Laterality Date   ABDOMINAL HYSTERECTOMY  1981   partial   BLADDER SURGERY  1981   BREAST EXCISIONAL BIOPSY Left 1998   Family History  Problem Relation Age of Onset   Hypertension Mother    Breast cancer Neg Hx    Social History   Socioeconomic History   Marital status: Divorced    Spouse name: Not on file   Number of children: Not on file   Years of education: Not on file   Highest education level: Not on file  Occupational History   Not on file  Tobacco Use   Smoking status: Never    Smokeless tobacco: Never  Vaping Use   Vaping status: Never Used  Substance and Sexual Activity   Alcohol use: Yes    Alcohol/week: 1.0 standard drink of alcohol    Types: 1 Shots of liquor per week    Comment: occasional   Drug use: No   Sexual activity: Not on file  Other Topics Concern   Not on file  Social History Narrative   Not on file   Social Drivers of Health   Financial Resource Strain: Low Risk  (09/27/2023)   Overall Financial Resource Strain (CARDIA)    Difficulty of Paying Living Expenses: Not hard at all  Food Insecurity: No Food Insecurity (09/27/2023)   Hunger Vital Sign    Worried About Running Out of Food in the Last Year: Never true    Ran Out of Food in the Last Year: Never true  Transportation Needs: No Transportation Needs (09/27/2023)   PRAPARE - Administrator, Civil Service (Medical): No    Lack of Transportation (Non-Medical): No  Physical Activity: Inactive (09/27/2023)   Exercise Vital Sign    Days of Exercise per Week: 0 days    Minutes of Exercise per Session: 0 min  Stress: No Stress Concern Present (09/27/2023)   [employer] of Occupational Health - Occupational Stress Questionnaire    Feeling of Stress : Only a little  Social Connections: Socially Isolated (09/27/2023)   Social Connection and Isolation Panel [NHANES]    Frequency of Communication with Friends and Family: More than three times a week    Frequency of Social Gatherings with Friends and Family: Once a week    Attends Religious Services: Never    Database administrator or Organizations: No    Attends Engineer, structural: Never    Marital Status: Divorced    Tobacco Counseling Counseling given: Not Answered    Clinical Intake:  Pre-visit preparation completed: Yes  Pain : No/denies pain     BMI - recorded: 36.83 Nutritional Status: BMI > 30  Obese Nutritional Risks: None Diabetes: Yes CBG done?: No Did pt. bring in CBG monitor from home?:  No  How often do you need to have someone help you when you read instructions, pamphlets, or other written materials from your doctor or pharmacy?: 1 - Never  Interpreter Needed?: No  Information entered by :: Kennedy Bucker, LPN   Activities of Daily Living     09/27/2023    9:31 AM 11/30/2022   10:14 AM  In your present state of health, do you have any difficulty performing the following activities:  Hearing? 1 1  Vision? 0 0  Difficulty concentrating or making decisions? 0 0  Walking or  climbing stairs? 1 1  Comment HAS TO HAVE HANDRAIL   Dressing or bathing? 0 0  Doing errands, shopping? 0 0  Preparing Food and eating ? N   Using the Toilet? N   In the past six months, have you accidently leaked urine? Y   Do you have problems with loss of bowel control? N   Managing your Medications? N   Managing your Finances? N   Housekeeping or managing your Housekeeping? N     Patient Care Team: Erasmo Downer, MD as PCP - General (Family Medicine) Ocie Cornfield, MD as Consulting Physician (Endocrinology) [facility])  Indicate any recent Medical Services you may have received from other than [facility] providers in the past year (date may be approximate).     Assessment:   This is a routine wellness examination for Yurani.  Hearing/Vision screen Hearing Screening - Comments:: WEARS AIDS, BOTH EARS Vision Screening - Comments:: WEARS GLASSES ALL THE TIME- [facility]   Goals Addressed             This Visit's Progress    DIET - INCREASE WATER INTAKE         Depression Screen     09/27/2023    9:23 AM 06/09/2023   11:11 AM 05/03/2023   10:51 AM 11/30/2022   10:14 AM 09/08/2022    3:29 PM 08/31/2022   10:52 AM 08/30/2022    3:15 PM  PHQ 2/9 Scores  PHQ - 2 Score 4 2 2 2 2 1 1   PHQ- 9 Score 7 5 6 4 5 7      Fall Risk     09/27/2023    9:29 AM 06/09/2023   11:11 AM 11/30/2022   10:14 AM 09/23/2022   12:11 PM 09/16/2022   10:16 AM  Fall Risk    Falls in the past year? 1 0 1 1 1   Comment    no falls since last visit No falls since last visit  Number falls in past yr: 1 0 0    Injury with Fall? 0 0 1    Risk for fall due to : Impaired balance/gait No Fall Risks History of fall(s)    Follow up Falls evaluation completed;Falls prevention discussed Falls evaluation completed Falls evaluation completed      MEDICARE RISK AT HOME:  Medicare Risk at Home Any stairs in or around the home?: Yes If so, are there any without handrails?: Yes Home free of loose throw rugs in walkways, pet beds, electrical cords, etc?: Yes Adequate lighting in your home to reduce risk of falls?: Yes Life alert?: No Use of a cane, walker or w/c?: Yes (USES CANE SOMETIMES) Grab bars in the bathroom?: Yes Shower chair or bench in shower?: No Elevated toilet seat or a handicapped toilet?: No  TIMED UP AND GO:  Was the test performed?  Yes  Length of time to ambulate 10 feet: 5 sec Gait slow and steady with assistive device  Cognitive Function: 6CIT completed        09/27/2023    9:35 AM 09/08/2022    3:41 PM  6CIT Screen  What Year? 0 points 0 points  What month? 0 points 0 points  What time? 0 points 0 points  Count back from 20 0 points 0 points  Months in reverse 0 points 0 points  Repeat phrase 2 points 0 points  Total Score 2 points 0 points    Immunizations Immunization History  Administered Date(s)  Administered   Fluad Quad(high Dose 65+) 04/24/2019, 05/21/2020, 05/22/2021, 05/19/2022   Fluad Trivalent(High Dose 65+) 05/03/2023   PFIZER Comirnaty(Gray Top)Covid-19 Tri-Sucrose Vaccine 10/10/2019, 07/18/2020   Pneumococcal Conjugate-13 06/20/2014   Pneumococcal Polysaccharide-23 11/18/2015   Tdap 01/28/2012   Zoster Recombinant(Shingrix) 04/24/2019, 06/26/2019   Zoster, Live 01/28/2012    Screening Tests Health Maintenance  Topic Date Due   Fecal DNA (Cologuard)  Never done   COVID-19 Vaccine (3 - Pfizer risk series) 08/15/2020    DTaP/Tdap/Td (2 - Td or Tdap) 01/27/2022   MAMMOGRAM  04/15/2023   OPHTHALMOLOGY EXAM  11/12/2023   HEMOGLOBIN A1C  11/18/2023   FOOT EXAM  05/02/2024   Diabetic kidney evaluation - eGFR measurement  05/19/2024   Diabetic kidney evaluation - Urine ACR  05/19/2024   Medicare Annual Wellness (AWV)  09/26/2024   Pneumonia Vaccine 72+ Years old  Completed   INFLUENZA VACCINE  Completed   DEXA SCAN  Completed   Hepatitis C Screening  Completed   Zoster Vaccines- Shingrix  Completed   HPV VACCINES  Aged Out   Colonoscopy  Discontinued    Health Maintenance  Health Maintenance Due  Topic Date Due   Fecal DNA (Cologuard)  Never done   COVID-19 Vaccine (3 - Pfizer risk series) 08/15/2020   DTaP/Tdap/Td (2 - Td or Tdap) 01/27/2022   MAMMOGRAM  04/15/2023   Health Maintenance Items Addressed: DEXA ordered  Additional Screening:  Vision Screening: Recommended annual ophthalmology exams for early detection of glaucoma and other disorders of the eye.  Dental Screening: Recommended annual dental exams for proper oral hygiene  Community Resource Referral / Chronic Care Management: CRR required this visit?  No   CCM required this visit?  No     Plan:     I have personally reviewed and noted the following in the patient's chart:   Medical and social history Use of alcohol, tobacco or illicit drugs  Current medications and supplements including opioid prescriptions. Patient is not currently taking opioid prescriptions. Functional ability and status Nutritional status Physical activity Advanced directives List of other physicians Hospitalizations, surgeries, and ER visits in previous 12 months Vitals Screenings to include cognitive, depression, and falls Referrals and appointments  In addition, I have reviewed and discussed with patient certain preventive protocols, quality metrics, and best practice recommendations. A written personalized care plan for preventive services  as well as general preventive health recommendations were provided to patient.     Hal Hope, LPN   7/82/9562   After Visit Summary: (In Person-Declined) Patient declined AVS at this time.  Notes:  REFERRAL MADE FOR BDS

## 2023-09-28 LAB — COMPREHENSIVE METABOLIC PANEL
ALT: 16 [IU]/L (ref 0–32)
AST: 17 [IU]/L (ref 0–40)
Albumin: 4.3 g/dL (ref 3.8–4.8)
Alkaline Phosphatase: 82 [IU]/L (ref 44–121)
BUN/Creatinine Ratio: 26 (ref 12–28)
BUN: 25 mg/dL (ref 8–27)
Bilirubin Total: 0.5 mg/dL (ref 0.0–1.2)
CO2: 24 mmol/L (ref 20–29)
Calcium: 9.7 mg/dL (ref 8.7–10.3)
Chloride: 101 mmol/L (ref 96–106)
Creatinine, Ser: 0.98 mg/dL (ref 0.57–1.00)
Globulin, Total: 1.7 g/dL (ref 1.5–4.5)
Glucose: 303 mg/dL — ABNORMAL HIGH (ref 70–99)
Potassium: 4.4 mmol/L (ref 3.5–5.2)
Sodium: 138 mmol/L (ref 134–144)
Total Protein: 6 g/dL (ref 6.0–8.5)
eGFR: 61 mL/min/{1.73_m2} (ref >59)

## 2023-09-28 LAB — LIPID PANEL
Chol/HDL Ratio: 3.7 {ratio} (ref 0.0–4.4)
Cholesterol, Total: 136 mg/dL (ref 100–199)
HDL: 37 mg/dL — ABNORMAL LOW (ref >39)
LDL Chol Calc (NIH): 57 mg/dL (ref 0–99)
Triglycerides: 265 mg/dL — ABNORMAL HIGH (ref 0–149)
VLDL Cholesterol Cal: 42 mg/dL — ABNORMAL HIGH (ref 5–40)

## 2023-09-28 LAB — CBC WITH DIFFERENTIAL/PLATELET
Basophils Absolute: 0 10*3/uL (ref 0.0–0.2)
Basos: 0 %
EOS (ABSOLUTE): 0.1 10*3/uL (ref 0.0–0.4)
Eos: 1 %
Hematocrit: 44.8 % (ref 34.0–46.6)
Hemoglobin: 15 g/dL (ref 11.1–15.9)
Immature Grans (Abs): 0 10*3/uL (ref 0.0–0.1)
Immature Granulocytes: 1 %
Lymphocytes Absolute: 2.6 10*3/uL (ref 0.7–3.1)
Lymphs: 33 %
MCH: 31.3 pg (ref 26.6–33.0)
MCHC: 33.5 g/dL (ref 31.5–35.7)
MCV: 94 fL (ref 79–97)
Monocytes Absolute: 0.6 10*3/uL (ref 0.1–0.9)
Monocytes: 8 %
Neutrophils Absolute: 4.5 10*3/uL (ref 1.4–7.0)
Neutrophils: 57 %
Platelets: 185 10*3/uL (ref 150–450)
RBC: 4.79 x10E6/uL (ref 3.77–5.28)
RDW: 12.6 % (ref 11.7–15.4)
WBC: 7.8 10*3/uL (ref 3.4–10.8)

## 2023-09-28 LAB — VITAMIN D 25 HYDROXY (VIT D DEFICIENCY, FRACTURES): Vit D, 25-Hydroxy: 16.6 ng/mL — ABNORMAL LOW (ref 30.0–100.0)

## 2023-09-28 LAB — HEMOGLOBIN A1C
Est. average glucose Bld gHb Est-mCnc: 272 mg/dL
Hgb A1c MFr Bld: 11.1 % — ABNORMAL HIGH (ref 4.8–5.6)

## 2023-09-28 LAB — TSH: TSH: 4.58 u[IU]/mL — ABNORMAL HIGH (ref 0.450–4.500)

## 2023-09-29 ENCOUNTER — Encounter: Payer: Self-pay | Admitting: Family Medicine

## 2023-10-03 ENCOUNTER — Ambulatory Visit (INDEPENDENT_AMBULATORY_CARE_PROVIDER_SITE_OTHER): Payer: Medicare PPO | Admitting: Dermatology

## 2023-10-03 DIAGNOSIS — L820 Inflamed seborrheic keratosis: Secondary | ICD-10-CM

## 2023-10-03 DIAGNOSIS — D692 Other nonthrombocytopenic purpura: Secondary | ICD-10-CM

## 2023-10-03 DIAGNOSIS — W908XXA Exposure to other nonionizing radiation, initial encounter: Secondary | ICD-10-CM

## 2023-10-03 DIAGNOSIS — L578 Other skin changes due to chronic exposure to nonionizing radiation: Secondary | ICD-10-CM

## 2023-10-03 DIAGNOSIS — L853 Xerosis cutis: Secondary | ICD-10-CM | POA: Diagnosis not present

## 2023-10-03 NOTE — Patient Instructions (Addendum)
 Cerave sheer spf for body   Cryotherapy Aftercare  Wash gently with soap and water everyday.   Apply Vaseline and Band-Aid daily until healed.   Seborrheic Keratosis  What causes seborrheic keratoses? Seborrheic keratoses are harmless, common skin growths that first appear during adult life.  As time goes by, more growths appear.  Some people may develop a large number of them.  Seborrheic keratoses appear on both covered and uncovered body parts.  They are not caused by sunlight.  The tendency to develop seborrheic keratoses can be inherited.  They vary in color from skin-colored to gray, brown, or even black.  They can be either smooth or have a rough, warty surface.   Seborrheic keratoses are superficial and look as if they were stuck on the skin.  Under the microscope this type of keratosis looks like layers upon layers of skin.  That is why at times the top layer may seem to fall off, but the rest of the growth remains and re-grows.    Treatment Seborrheic keratoses do not need to be treated, but can easily be removed in the office.  Seborrheic keratoses often cause symptoms when they rub on clothing or jewelry.  Lesions can be in the way of shaving.  If they become inflamed, they can cause itching, soreness, or burning.  Removal of a seborrheic keratosis can be accomplished by freezing, burning, or surgery. If any spot bleeds, scabs, or grows rapidly, please return to have it checked, as these can be an indication of a skin cancer.     Gentle Skin Care Guide  1. Bathe no more than once a day.  2. Avoid bathing in hot water  3. Use a mild soap like Dove, Vanicream, Cetaphil, CeraVe. Can use Lever 2000 or Cetaphil antibacterial soap  4. Use soap only where you need it. On most days, use it under your arms, between your legs, and on your feet. Let the water rinse other areas unless visibly dirty.  5. When you get out of the bath/shower, use a towel to gently blot your skin dry,  don't rub it.  6. While your skin is still a little damp, apply a moisturizing cream such as Vanicream, CeraVe, Cetaphil, Eucerin, Sarna lotion or plain Vaseline Jelly. For hands apply Neutrogena [place] Hand Cream or Excipial Hand Cream.  7. Reapply moisturizer any time you start to itch or feel dry.  8. Sometimes using free and clear laundry detergents can be helpful. Fabric softener sheets should be avoided. Downy Free & Gentle liquid, or any liquid fabric softener that is free of dyes and perfumes, it acceptable to use  9. If your doctor has given you prescription creams you may apply moisturizers over them       Due to recent changes in healthcare laws, you may see results of your pathology and/or laboratory studies on MyChart before the doctors have had a chance to review them. We understand that in some cases there may be results that are confusing or concerning to you. Please understand that not all results are received at the same time and often the doctors may need to interpret multiple results in order to provide you with the best plan of care or course of treatment. Therefore, we ask that you please give [place] 2 business days to thoroughly review all your results before contacting the office for clarification. Should we see a critical lab result, you will be contacted sooner.   If You Need Anything After Your  Visit  If you have any questions or concerns for your doctor, please call our main line at [phone] and press option 4 to reach your doctor's medical assistant. If no one answers, please leave a voicemail as directed and we will return your call as soon as possible. Messages left after 4 pm will be answered the following business day.   You may also send [place] a message via MyChart. We typically respond to MyChart messages within 1-2 business days.  For prescription refills, please ask your pharmacy to contact our office. Our fax number is [phone].  If you have an  urgent issue when the clinic is closed that cannot wait until the next business day, you can page your doctor at the number below.    Please note that while we do our best to be available for urgent issues outside of office hours, we are not available 24/7.   If you have an urgent issue and are unable to reach [place], you may choose to seek medical care at your doctor's office, retail clinic, urgent care center, or emergency room.  If you have a medical emergency, please immediately call 911 or go to the emergency department.  Pager Numbers  - Dr. Gwen Pounds: [phone]  - Dr. Roseanne Reno: [phone]  - Dr. Katrinka Blazing: [phone]   In the event of inclement weather, please call our main line at [phone] for an update on the status of any delays or closures.  Dermatology Medication Tips: Please keep the boxes that topical medications come in in order to help keep track of the instructions about where and how to use these. Pharmacies typically print the medication instructions only on the boxes and not directly on the medication tubes.   If your medication is too expensive, please contact our office at [phone] option 4 or send [place] a message through MyChart.   We are unable to tell what your co-pay for medications will be in advance as this is different depending on your insurance coverage. However, we may be able to find a substitute medication at lower cost or fill out paperwork to get insurance to cover a needed medication.   If a prior authorization is required to get your medication covered by your insurance company, please allow [place] 1-2 business days to complete this process.  Drug prices often vary depending on where the prescription is filled and some pharmacies may offer cheaper prices.  The website www.goodrx.com contains coupons for medications through different pharmacies. The prices here do not account for what the cost may be with help from insurance (it may be cheaper with your  insurance), but the website can give you the price if you did not use any insurance.  - You can print the associated coupon and take it with your prescription to the pharmacy.  - You may also stop by our office during regular business hours and pick up a GoodRx coupon card.  - If you need your prescription sent electronically to a different pharmacy, notify our office through [facility] or by phone at [phone] option 4.     Si Usted Necesita Algo Despus de Su Visita  Tambin puede enviarnos un mensaje a travs de Clinical cytogeneticist. Por lo general respondemos a los mensajes de MyChart en el transcurso de 1 a 2 das hbiles.  Para renovar recetas, por favor pida a su farmacia que se ponga en contacto con nuestra oficina. Annie Sable de fax es [place] [phone].  Si tiene un asunto urgente cuando  la clnica est cerrada y que no puede esperar hasta el siguiente da hbil, puede llamar/localizar a su doctor(a) al nmero que aparece a continuacin.   Por favor, tenga en cuenta que aunque hacemos todo lo posible para estar disponibles para asuntos urgentes fuera del horario de [place], no estamos disponibles las 24 horas del da, los 7 [address]  [address] de la [place].   Si tiene un problema urgente y no puede comunicarse con nosotros, puede optar por buscar atencin mdica  en el consultorio de su doctor(a), en una clnica privada, en un centro de atencin urgente o en una sala de emergencias.  Si tiene Engineer, drilling, por favor llame inmediatamente al 911 o vaya a la sala de emergencias.  Nmeros de bper  - Dr. Gwen Pounds: [phone]  - Dra. Roseanne Reno: [phone]  - Dr. Katrinka Blazing: [phone]   En caso de inclemencias del tiempo, por favor llame a Lacy Duverney principal al [phone] para una actualizacin sobre el [place] de cualquier retraso o cierre.  Consejos para la medicacin en dermatologa: Por favor, guarde las cajas en las que vienen los medicamentos de uso tpico para ayudarle a  seguir las instrucciones sobre dnde y cmo usarlos. Las farmacias generalmente imprimen las instrucciones del medicamento slo en las cajas y no directamente en los tubos del [place].   Si su medicamento es muy caro, por favor, pngase en contacto con Rolm Gala llamando al [phone] y presione la opcin 4 o envenos un mensaje a travs de Clinical cytogeneticist.   No podemos decirle cul ser su copago por los medicamentos por adelantado ya que esto es diferente dependiendo de la cobertura de su seguro. Sin embargo, es posible que podamos encontrar un medicamento sustituto a Audiological scientist un formulario para que el seguro cubra el medicamento que se considera necesario.   Si se requiere una autorizacin previa para que su compaa de seguros [place] su medicamento, por favor permtanos de 1 a 2 das hbiles para completar [address]  Los precios de los medicamentos varan con frecuencia dependiendo del Environmental consultant de dnde se surte la receta y alguna farmacias pueden ofrecer precios ms baratos.  El sitio web www.goodrx.com tiene cupones para medicamentos de Health and safety inspector. Los precios aqu no tienen en cuenta lo que podra costar con la ayuda del seguro (puede ser ms barato con su seguro), pero el sitio web puede darle el precio si no utiliz Tourist information centre manager.  - Puede imprimir el cupn correspondiente y llevarlo con su receta a la farmacia.  - Tambin puede pasar por nuestra oficina durante el horario de atencin regular y Education officer, museum una tarjeta de cupones de GoodRx.  - Si necesita que su receta se enve electrnicamente a una farmacia diferente, informe a nuestra oficina a travs de MyChart de [place] o por telfono llamando al [phone] y presione la opcin 4.

## 2023-10-03 NOTE — Progress Notes (Signed)
   Follow-Up Visit   Subjective  Christine Clarke is a 73 y.o. female who presents for the following: Rosacea vs sun damage at chest. Patient reports she did not start metronidazole cream to affected spots at chest. She has been using CeraVe cream with improvement noted And mentions an irritated spot at right thigh that she picks at and spot at right arm (previously frozen) to recheck.    The following portions of the chart were reviewed this encounter and updated as appropriate: medications, allergies, medical history  Review of Systems:  No other skin or systemic complaints except as noted in HPI or Assessment and Plan.  Objective  Well appearing patient in no apparent distress; mood and affect are within normal limits.  Areas Examined: Face, chest, right thigh, right arm   Relevant physical exam findings are noted in the Assessment and Plan.  right lateral thigh x 1 Erythematous stuck-on, waxy papule or plaque  Assessment & Plan   INFLAMED SEBORRHEIC KERATOSIS right lateral thigh x 1 Symptomatic, irritating, patient would like treated. Destruction of lesion - right lateral thigh x 1  Destruction method: cryotherapy   Informed consent: discussed and consent obtained   Lesion destroyed using liquid nitrogen: Yes   Region frozen until ice ball extended beyond lesion: Yes   Outcome: patient tolerated procedure well with no complications   Post-procedure details: wound care instructions given   Additional details:  Prior to procedure, discussed risks of blister formation, small wound, skin dyspigmentation, or rare scar following cryotherapy. Recommend Vaseline ointment to treated areas while healing.   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  - Recommend staying in the shade or wearing long sleeves, sun glasses (UVA+UVB protection) and wide brim hats (4-inch brim around the entire circumference of the hat). - Call for new or changing lesions.  Purpura - Chronic; persistent and recurrent.  Treatable, but not curable. - Violaceous macules and patches at arms - Benign - Related to trauma, age, sun damage and/or use of blood thinners, chronic use of topical and/or oral steroids - Observe - Can use OTC arnica containing moisturizer such as Dermend Bruise Formula if desired - Call for worsening or other concerns   INFLAMED SEBORRHEIC KERATOSIS  Exam: clear today at right antecubital fossa   Treatment Plan:  - resolved post cryotherapy - benign, observe for recurrence   Return if symptoms worsen or fail to improve.  I, Asher Muir, CMA, am acting as scribe for Willeen Niece, MD.   Documentation: I have reviewed the above documentation for accuracy and completeness, and I agree with the above.  Willeen Niece, MD

## 2023-10-04 ENCOUNTER — Other Ambulatory Visit: Payer: Self-pay

## 2023-10-04 DIAGNOSIS — E559 Vitamin D deficiency, unspecified: Secondary | ICD-10-CM

## 2023-10-04 MED ORDER — VITAMIN D (ERGOCALCIFEROL) 1.25 MG (50000 UNIT) PO CAPS: 50000.0000 [IU] | ORAL_CAPSULE | ORAL | 0 refills | Status: DC

## 2023-10-10 ENCOUNTER — Ambulatory Visit: Attending: Otolaryngology

## 2023-10-10 DIAGNOSIS — G4761 Periodic limb movement disorder: Secondary | ICD-10-CM | POA: Insufficient documentation

## 2023-10-10 DIAGNOSIS — E039 Hypothyroidism, unspecified: Secondary | ICD-10-CM | POA: Insufficient documentation

## 2023-10-10 DIAGNOSIS — I10 Essential (primary) hypertension: Secondary | ICD-10-CM | POA: Insufficient documentation

## 2023-10-10 DIAGNOSIS — E119 Type 2 diabetes mellitus without complications: Secondary | ICD-10-CM | POA: Diagnosis not present

## 2023-10-10 DIAGNOSIS — E785 Hyperlipidemia, unspecified: Secondary | ICD-10-CM | POA: Insufficient documentation

## 2023-10-10 DIAGNOSIS — R400 Somnolence: Secondary | ICD-10-CM | POA: Diagnosis present

## 2023-10-10 DIAGNOSIS — R2689 Other abnormalities of gait and mobility: Secondary | ICD-10-CM | POA: Insufficient documentation

## 2023-10-10 DIAGNOSIS — Z9181 History of falling: Secondary | ICD-10-CM | POA: Insufficient documentation

## 2023-10-10 DIAGNOSIS — Z79899 Other long term (current) drug therapy: Secondary | ICD-10-CM | POA: Insufficient documentation

## 2023-10-10 DIAGNOSIS — F320 Major depressive disorder, single episode, mild: Secondary | ICD-10-CM | POA: Insufficient documentation

## 2023-10-10 DIAGNOSIS — E559 Vitamin D deficiency, unspecified: Secondary | ICD-10-CM | POA: Insufficient documentation

## 2023-10-10 DIAGNOSIS — G4752 REM sleep behavior disorder: Secondary | ICD-10-CM | POA: Insufficient documentation

## 2023-10-10 DIAGNOSIS — F411 Generalized anxiety disorder: Secondary | ICD-10-CM | POA: Insufficient documentation

## 2023-10-10 DIAGNOSIS — G4733 Obstructive sleep apnea (adult) (pediatric): Secondary | ICD-10-CM | POA: Diagnosis not present

## 2023-10-20 ENCOUNTER — Other Ambulatory Visit (HOSPITAL_COMMUNITY): Payer: Self-pay

## 2023-10-24 ENCOUNTER — Telehealth: Payer: Self-pay

## 2023-10-24 NOTE — Telephone Encounter (Signed)
 Patient was identified as falling into the [place] Measure - Diabetes.   Patient was: Appointment scheduled for lab or office visit for A1c.  Patient seen on 09/27/23, referral made to Endocrinology.  Patient had slight decrease in A1C from 11.7 to 11.1.  Patient will follow up with PCP in May

## 2023-10-27 ENCOUNTER — Telehealth: Payer: Self-pay

## 2023-10-27 NOTE — Telephone Encounter (Signed)
 Copied from CRM [id number]. Topic: General - Other >> Oct 27, 2023  4:13 PM Patsy Lager T wrote: Reason for CRM: patient called requesting info about her last sleep study. Please f/u with patient

## 2023-10-28 NOTE — Telephone Encounter (Signed)
 Pt advised Dr.B not in office today. Ok with waiting for return to have results further reviewed and would like a call back with next steps.

## 2023-10-28 NOTE — Telephone Encounter (Signed)
 Patient again to have provider call her back about her sleep study. She said she is worried about her results and need to speak with someone today

## 2023-10-31 NOTE — Telephone Encounter (Signed)
 Sleep study shows AHI of 60.9, which means that every hour on average she stopped breathing or dropped her oxygen levels 60 times.  This corresponds to severe sleep apnea. Recommend CPAP titration study to optimize therapy given significant AHI.  Ok to place order if patient agrees.

## 2023-11-01 NOTE — Telephone Encounter (Signed)
 It's called a CPAP titration study.  She is likely "sleeping too much" because her sleep quality is poor because she is stopping breathing so much while sleeping.

## 2023-11-01 NOTE — Telephone Encounter (Signed)
 Pt advised and verbalized understanding but would like to know if insurance will cover and will this also assist her with helping her sleep too much. Advised she would have to contact insurance in regards to cost. She has requested a mychart message advising the name of the study she should be asking about.  Please advise in regards to if this will assist patient with her sleeping too much.

## 2023-11-04 ENCOUNTER — Ambulatory Visit: Payer: Self-pay

## 2023-11-04 NOTE — Telephone Encounter (Signed)
 Chief Complaint: vertigo/possible sinus infection Symptoms: dizziness-vertigo, vomiting, sinus congestion Frequency: vertigo started yesterday, sinus symptoms started a week ago Pertinent Negatives: Patient denies CP, SOB Disposition: [] ED /[] Urgent Care (no appt availability in office) / [] Appointment(In office/virtual)/ []  [place] [facility]/ [] Home Care/ [] Refused Recommended Disposition /[] [place] [facility]/ [x]  Follow-up with PCP Additional Notes: patient calling with concerns for vertigo-patient states she has had vertigo before and possible sinus infection. Patient states sinus congestion started a week ago-patient endorses clear nasal discharge. Patient states she did buy OTC medication for vertigo which hasn't worked yet. Per protocol, patient is recommended to be seen within three days. Unable to schedule due to availability.  Patient is asking for medication to be sent to her pharmacy as she is unable to drive at this time. Patient would like a call back. Patient verbalized understanding and all questions answered.     Copied from CRM [id number]. Topic: Clinical - Red Word Triage >> Nov 04, 2023 12:17 PM Eunice Blase wrote: Red Word that prompted transfer to Nurse Triage: Pt called is vomiting with dizziness. Reason for Disposition  [1] Sinus congestion (pressure, fullness) AND [2] present > 10 days  [1] MODERATE dizziness (e.g., vertigo; feels very unsteady, interferes with normal activities) AND [2] has been evaluated by doctor (or NP/PA) for this  Answer Assessment - Initial Assessment Questions 1. DESCRIPTION: "Describe your dizziness."     Patient states she feels like she is spinning. 2. VERTIGO: "Do you feel like either you or the room is spinning or tilting?"      yes 3. LIGHTHEADED: "Do you feel lightheaded?" (e.g., somewhat faint, woozy, weak upon standing)     Unsure if she is lightheaded 4. SEVERITY: "How bad is it?"  "Can you walk?"   - MILD: Feels slightly dizzy  and unsteady, but is walking normally.   - MODERATE: Feels unsteady when walking, but not falling; interferes with normal activities (e.g., school, work).   - SEVERE: Unable to walk without falling, or requires assistance to walk without falling.     moderate 5. ONSET:  "When did the dizziness begin?"     Started yesterday afternoon 6. AGGRAVATING FACTORS: "Does anything make it worse?" (e.g., standing, change in head position)     Moving, changing positions 7. CAUSE: "What do you think is causing the dizziness?"     Vertigo, possible sinus infection 8. RECURRENT SYMPTOM: "Have you had dizziness before?" If Yes, ask: "When was the last time?" "What happened that time?"     Yes-last December 9. OTHER SYMPTOMS: "Do you have any other symptoms?" (e.g., headache, weakness, numbness, vomiting, earache)     vomiting  Answer Assessment - Initial Assessment Questions 1. LOCATION: "Where does it hurt?"      Pain under eyes and nose 2. ONSET: "When did the sinus pain start?"  (e.g., hours, days)      Started a week ago 3. SEVERITY: "How bad is the pain?"   (Scale 1-10; mild, moderate or severe)   - MILD (1-3): doesn't interfere with normal activities    - MODERATE (4-7): interferes with normal activities (e.g., work or school) or awakens from sleep   - SEVERE (8-10): excruciating pain and patient unable to do any normal activities        7 out of 10 4. RECURRENT SYMPTOM: "Have you ever had sinus problems before?" If Yes, ask: "When was the last time?" and "What happened that time?"      yes 5. NASAL  CONGESTION: "Is the nose blocked?" If Yes, ask: "Can you open it or must you breathe through your mouth?"     Yes some what  6. NASAL DISCHARGE: "Do you have discharge from your nose?" If so ask, "What color?"     Yes but not colored 7. FEVER: "Do you have a fever?" If Yes, ask: "What is it, how was it measured, and when did it start?"      no 8. OTHER SYMPTOMS: "Do you have any other symptoms?"  (e.g., sore throat, cough, earache, difficulty breathing)     Sore throat, earache  Protocols used: Dizziness - Vertigo-A-AH, Sinus Pain or Congestion-A-AH

## 2023-11-04 NOTE — Telephone Encounter (Signed)
 Ok to set her a virtual visit to discuss the results if this is what she prefers.  Our referral team gets authorization with the insurance when we order tests. I do not call the company myself.

## 2023-11-09 ENCOUNTER — Other Ambulatory Visit: Payer: Self-pay | Admitting: Family Medicine

## 2023-11-09 DIAGNOSIS — E559 Vitamin D deficiency, unspecified: Secondary | ICD-10-CM

## 2023-11-09 DIAGNOSIS — E034 Atrophy of thyroid (acquired): Secondary | ICD-10-CM

## 2023-11-10 NOTE — Telephone Encounter (Signed)
 Reports finding old rx at home for vertigo and sinus infection and took both. States she is now feeling better but still wanting to speak with Dr.B in regards to her sleep study results. Advised mychart visit is still needed. She verbalized understanding but when trying to schedule last week provider had no openings. Pt scheduled for 11/17/23 at 1 pm to discuss her sleep study results

## 2023-11-10 NOTE — Telephone Encounter (Signed)
 Needs to be seen. We do not write for antibiotics without evaluating the patient.

## 2023-11-10 NOTE — Telephone Encounter (Signed)
 Requested Prescriptions  Pending Prescriptions Disp Refills   Vitamin D, Ergocalciferol, (DRISDOL) 1.25 MG (50000 UNIT) CAPS capsule [Pharmacy Med Name: VITAMIN D2 50,000IU (ERGO) CAP RX] 12 capsule 0    Sig: TAKE 1 CAPSULE BY MOUTH EVERY 7 DAYS     Endocrinology:  Vitamins - Vitamin D Supplementation 2 Failed - 11/10/2023 10:36 AM      Failed - Manual Review: Route requests for 50,000 IU strength to the provider      Failed - Vitamin D in normal range and within 360 days    Vit D, 25-Hydroxy  Date Value Ref Range Status  09/27/2023 16.6 (L) 30.0 - 100.0 ng/mL Final    Comment:    Vitamin D deficiency has been defined by the Institute of Medicine and an Endocrine Society practice guideline as a level of serum 25-OH vitamin D less than 20 ng/mL (1,2). The Endocrine Society went on to further define vitamin D insufficiency as a level between 21 and 29 ng/mL (2). 1. IOM (Institute of Medicine). 2010. Dietary reference    intakes for calcium and D. [place]: The    [employer]. 2. Holick MF, Binkley Bel-Nor, Bischoff-Ferrari HA, et al.    Evaluation, treatment, and prevention of vitamin D    deficiency: an Endocrine Society clinical practice    guideline. JCEM. 2011 Jul; 96(7):1911-30.          Passed - Ca in normal range and within 360 days    Calcium  Date Value Ref Range Status  09/27/2023 9.7 8.7 - 10.3 mg/dL Final         Passed - Valid encounter within last 12 months    Recent Outpatient Visits           1 month ago Encounter for annual physical exam   [facility] [place], Marzella Schlein, MD       Future Appointments             In 1 week Bacigalupo, Marzella Schlein, MD [facility]   In 1 month Bacigalupo, Marzella Schlein, MD [facility], [facility]             losartan (COZAAR) 50 MG tablet [Pharmacy Med Name: LOSARTAN 50MG  TABLETS] 90 tablet 0    Sig: TAKE 1 TABLET(50 MG) BY MOUTH  DAILY     Cardiovascular:  Angiotensin Receptor Blockers Failed - 11/10/2023 10:36 AM      Failed - Last BP in normal range    BP Readings from Last 1 Encounters:  09/27/23 (!) 173/73         Passed - Cr in normal range and within 180 days    Creatinine, Ser  Date Value Ref Range Status  09/27/2023 0.98 0.57 - 1.00 mg/dL Final   Creatinine, Urine  Date Value Ref Range Status  05/20/2023 118  Final         Passed - K in normal range and within 180 days    Potassium  Date Value Ref Range Status  09/27/2023 4.4 3.5 - 5.2 mmol/L Final         Passed - Patient is not pregnant      Passed - Valid encounter within last 6 months    Recent Outpatient Visits           1 month ago Encounter for annual physical exam   [facility] [place], Marzella Schlein, MD  Future Appointments             In 1 week Bacigalupo, Marzella Schlein, MD [facility], [facility]   In 1 month Bacigalupo, Marzella Schlein, MD [facility], [facility]             levothyroxine (SYNTHROID) 50 MCG tablet [Pharmacy Med Name: LEVOTHYROXINE 0.05MG  ( ) TAB] 90 tablet 0    Sig: TAKE 1 TABLET(50 MCG) BY MOUTH DAILY BEFORE BREAKFAST     Endocrinology:  Hypothyroid Agents Failed - 11/10/2023 10:36 AM      Failed - TSH in normal range and within 360 days    TSH  Date Value Ref Range Status  09/27/2023 4.580 (H) 0.450 - 4.500 uIU/mL Final         Passed - Valid encounter within last 12 months    Recent Outpatient Visits           1 month ago Encounter for annual physical exam   [facility] [place], Marzella Schlein, MD       Future Appointments             In 1 week Bacigalupo, Marzella Schlein, MD [facility]   In 1 month Bacigalupo, Marzella Schlein, MD [facility], [facility]

## 2023-11-14 LAB — HM DIABETES EYE EXAM

## 2023-11-17 ENCOUNTER — Telehealth: Admitting: Family Medicine

## 2023-11-17 ENCOUNTER — Encounter: Payer: Self-pay | Admitting: Family Medicine

## 2023-11-17 DIAGNOSIS — E669 Obesity, unspecified: Secondary | ICD-10-CM

## 2023-11-17 DIAGNOSIS — Z7985 Long-term (current) use of injectable non-insulin antidiabetic drugs: Secondary | ICD-10-CM

## 2023-11-17 DIAGNOSIS — I10 Essential (primary) hypertension: Secondary | ICD-10-CM | POA: Diagnosis not present

## 2023-11-17 DIAGNOSIS — E1165 Type 2 diabetes mellitus with hyperglycemia: Secondary | ICD-10-CM

## 2023-11-17 DIAGNOSIS — E1169 Type 2 diabetes mellitus with other specified complication: Secondary | ICD-10-CM

## 2023-11-17 DIAGNOSIS — E559 Vitamin D deficiency, unspecified: Secondary | ICD-10-CM

## 2023-11-17 DIAGNOSIS — G4733 Obstructive sleep apnea (adult) (pediatric): Secondary | ICD-10-CM | POA: Diagnosis not present

## 2023-11-17 DIAGNOSIS — G4761 Periodic limb movement disorder: Secondary | ICD-10-CM

## 2023-11-17 MED ORDER — VITAMIN D (ERGOCALCIFEROL) 1.25 MG (50000 UNIT) PO CAPS: 50000.0000 [IU] | ORAL_CAPSULE | ORAL | 0 refills | Status: DC

## 2023-11-17 MED ORDER — CARVEDILOL 6.25 MG PO TABS: 6.2500 mg | ORAL_TABLET | Freq: Two times a day (BID) | ORAL | 1 refills | Status: AC

## 2023-11-17 NOTE — Progress Notes (Signed)
 MyChart Video Visit    Virtual Visit via Video Note   This format is felt to be most appropriate for this patient at this time. Physical exam was limited by quality of the video and audio technology used for the visit.    Patient location: home Provider location: [facility] Persons involved in the visit: patient, provider  I discussed the limitations of evaluation and management by telemedicine and the availability of in person appointments. The patient expressed understanding and agreed to proceed.  Patient: Christine Clarke   DOB: August 22, 1950   73 y.o. Female  MRN: [id number] Visit Date: 11/17/2023  Today's healthcare provider: Shirlee Latch, MD   No chief complaint on file.  Subjective    HPI   Discussed the use of AI scribe software for clinical note transcription with the patient, who gave verbal consent to proceed.  History of Present Illness   The patient, with a history of sleep apnea, presents with concerns about her recent sleep study results. She reports severe sleep apnea, with oxygen levels dropping to 84% and cessation of breathing occurring approximately 60.9 times per hour. The patient also mentions frequent limb movements during sleep but denies experiencing restless legs syndrome. She expresses concern about the use of a CPAP machine as a treatment option, citing knowledge of others who have struggled with consistent use.  In addition to sleep apnea, the patient has been dealing with a vitamin D deficiency. She was prescribed a high-dose vitamin D supplement to be taken once a week for three months, but mistakenly took the entire prescription daily for a month. She has been off the supplement for at least two weeks.  The patient also mentions a medication prescribed for weight loss and diabetes management, which she found to be unaffordable. She expresses a desire for assistance in finding a more affordable option.  Lastly, the patient reports  a knee problem, for which she is hoping to receive a gel injection in the coming weeks. She also mentions taking carvedilol for blood pressure management, but there seems to be confusion about the correct dosage and timing.       Review of Systems      Objective    LMP  (LMP Unknown)       Physical Exam Constitutional:      General: She is not in acute distress.    Appearance: Normal appearance.  HENT:     Head: Normocephalic.  Pulmonary:     Effort: Pulmonary effort is normal. No respiratory distress.  Neurological:     Mental Status: She is alert and oriented to person, place, and time. Mental status is at baseline.        Assessment & Plan     Problem List Items Addressed This Visit       Respiratory   OSA (obstructive sleep apnea) - Primary   Relevant Orders   Ambulatory referral to Sleep Studies     Endocrine   T2DM (type 2 diabetes mellitus) (HCC)   Relevant Orders   AMB Referral VBCI Care Management     Other   Vitamin D deficiency   Relevant Medications   Vitamin D, Ergocalciferol, (DRISDOL) 1.25 MG (50000 UNIT) CAPS capsule   Other Visit Diagnoses       Periodic limb movement       Relevant Orders   Ambulatory referral to Sleep Studies           Severe Obstructive Sleep Apnea  A sleep study on March 10 revealed severe obstructive sleep apnea with an AHI of 60.9 events per hour and a lowest oxygen saturation of 84%, indicating significant interruptions in breathing during sleep. CPAP is recommended to maintain airway patency and improve sleep quality, which may also benefit cardiovascular health and blood pressure. She expressed concerns about CPAP compliance, noting that many people she knows do not use it consistently. CPAP is the primary treatment, with BiPAP as an alternative if CPAP is ineffective.   - Refer to sleep lab for CPAP titration study to determine optimal pressure settings.   - Instruct her to inquire with the sleep lab about  insurance coverage for the CPAP titration study.   - Advise her to sleep in her usual positions during the study to ensure accurate results.    Hypertension   She is on carvedilol for hypertension, taken once daily at lunch. It should be taken twice daily, once in the morning and once at night, to better manage blood pressure levels.   - Instruct her to take carvedilol twice daily, once in the morning and once at night.   - Refill carvedilol prescription.    Type 2 Diabetes Mellitus with Obesity   She was prescribed Mounjaro for diabetes and weight loss, but the cost was prohibitive. The endocrinologist was supposed to contact the provider regarding alternative options, but no communication has been received. The office pharmacist will assist in exploring insurance coverage and patient assistance programs.   - Have the office pharmacist contact her to explore insurance coverage and patient assistance programs for Mounjaro.    Vitamin D Deficiency   She was previously prescribed a high-dose vitamin D regimen but mistakenly took the medication daily instead of weekly, depleting the supply prematurely. She has been off the medication for at least two weeks.   - Re-prescribe high-dose vitamin D to be taken once weekly for three months.       Meds ordered this encounter  Medications   Vitamin D, Ergocalciferol, (DRISDOL) 1.25 MG (50000 UNIT) CAPS capsule    Sig: Take 1 capsule (50,000 Units total) by mouth every 7 (seven) days.    Dispense:  12 capsule    Refill:  0   carvedilol (COREG) 6.25 MG tablet    Sig: Take 1 tablet (6.25 mg total) by mouth 2 (two) times daily.    Dispense:  180 tablet    Refill:  1     Return for as scheduled.     I discussed the assessment and treatment plan with the patient. The patient was provided an opportunity to ask questions and all were answered. The patient agreed with the plan and demonstrated an understanding of the instructions.   The patient was  advised to call back or seek an in-person evaluation if the symptoms worsen or if the condition fails to improve as anticipated.   Aden Agreste, MD [facility] [phone] (phone) [phone] (fax)  [facility]

## 2023-11-22 ENCOUNTER — Telehealth: Payer: Self-pay | Admitting: *Deleted

## 2023-11-22 NOTE — Progress Notes (Unsigned)
 Care Guide Pharmacy Note  11/22/2023 Name: Christine Clarke MRN: [id number] DOB: 08-Jun-1951  Referred By: Mazie Speed, MD Reason for referral: Complex Care Management and Call Attempt #1 (Outreach to schedule referral with pharmacist )   Christine Clarke is a 73 y.o. year old female who is a primary care patient of Bacigalupo, Stan Eans, MD.  Christine Clarke was referred to the pharmacist for assistance related to: DMII  An unsuccessful telephone outreach was attempted today to contact the patient who was referred to the pharmacy team for assistance with medication assistance. Additional attempts will be made to contact the patient.  Kandis Ormond, CMA [place]  [facility], [facility] Guide Direct Dial: [phone]  Fax: [phone] Website: [email or web address]

## 2023-11-24 NOTE — Progress Notes (Unsigned)
 Care Guide Pharmacy Note  11/24/2023 Name: Christine Clarke MRN: [id number] DOB: [birth year]  Referred By: Mazie Speed, MD Reason for referral: Complex Care Management and Call Attempt #1 (Outreach to schedule referral with pharmacist )   Christine Clarke is a 73 y.o. year old female who is a primary care patient of Bacigalupo, Stan Eans, MD.  Christine Clarke was referred to the pharmacist for assistance related to: DMII  A second unsuccessful telephone outreach was attempted today to contact the patient who was referred to the pharmacy team for assistance with medication assistance. Additional attempts will be made to contact the patient.  Kandis Ormond, CMA [place]  [facility], [facility] Guide Direct Dial: [phone]  Fax: [phone] Website: [email or web address]

## 2023-11-25 NOTE — Progress Notes (Signed)
 Care Guide Pharmacy Note  11/25/2023 Name: [name] MRN: [id number] DOB: 1950-11-10  Referred By: Mazie Speed, MD Reason for referral: Complex Care Management and Call Attempt #1 (Outreach to schedule referral with pharmacist )   Christine Clarke is a 73 y.o. year old female who is a primary care patient of Bacigalupo, Angela M, MD.  ZOPHIA MARRONE was referred to the pharmacist for assistance related to: DMII  Successful contact was made with the patient to discuss pharmacy services including being ready for the pharmacist to call at least 5 minutes before the scheduled appointment time and to have medication bottles and any blood pressure readings ready for review. The patient agreed to meet with the pharmacist via telephone visit on 12/06/2023  Kandis Ormond, CMA [place]  [facility], [place] [facility] Guide Direct Dial: [phone]  Fax: [phone] Website: [email or web address]

## 2023-12-06 ENCOUNTER — Other Ambulatory Visit: Payer: Self-pay | Admitting: Pharmacist

## 2023-12-06 NOTE — Progress Notes (Addendum)
   12/06/2023  Patient ID: Christine Clarke, female   DOB: Sep 04, 1950, 73 y.o.   MRN: [id number]  Called and spoke with the patient on the phone regarding Mounjaro cost.  Advised prescription should be about $300 due to $250 drug deductible + $47 monthly copay.   Asked about Ozempic PAP, but worries she may be over the income limit. Will defer for now.  Said she would go ahead and start the Mounjaro and pay the one-time $300 fee to get started. Will start around 5/19 likely after she returns from her vacation on 5/18. Advised I will call the pharmacy to get script ready mid-next week so it's ready for pick-up when she returns.  Reviewed monitoring for nausea/vomiting and how to take the medication. Similar to Trulicity  (took in 2023). Advised to call office if any concerns prior to next PCP visit.   Has my phone number to reach me with any concerns. Did NOT start the Glipizide  yet and updating med list to reflect Endo's recommendations from visit. Plan to follow-up on 5/23 to ask about concerns.  Also having difficulty with getting Dexcom to stay on. Received replacements from company, but another fell off last night. Tried the adhesive covers as well. Advised to consider trying different locations like stomach or thigh placement in the future.   Update from 12/14/23: - Processing the Mounjaro now- cost is $223 for this fill - Will have shipment arrive by Friday to fill  Update from 12/24/23: - Tried calling patient as it does NOT appear she picked up and started the [facility] - Left voicemail requesting call back at earliest convenience; has PCP visit next Tuesday anyways   Delvin File, PharmD [facility]  Phone Number: [phone]

## 2023-12-27 ENCOUNTER — Ambulatory Visit: Payer: Medicare PPO | Admitting: Family Medicine

## 2023-12-27 ENCOUNTER — Encounter: Payer: Self-pay | Admitting: Family Medicine

## 2023-12-27 VITALS — BP 138/75 | HR 84 | Ht 60.0 in

## 2023-12-27 DIAGNOSIS — G8929 Other chronic pain: Secondary | ICD-10-CM

## 2023-12-27 DIAGNOSIS — E559 Vitamin D deficiency, unspecified: Secondary | ICD-10-CM | POA: Diagnosis not present

## 2023-12-27 DIAGNOSIS — I152 Hypertension secondary to endocrine disorders: Secondary | ICD-10-CM

## 2023-12-27 DIAGNOSIS — S63501A Unspecified sprain of right wrist, initial encounter: Secondary | ICD-10-CM

## 2023-12-27 DIAGNOSIS — K219 Gastro-esophageal reflux disease without esophagitis: Secondary | ICD-10-CM | POA: Diagnosis not present

## 2023-12-27 DIAGNOSIS — Z794 Long term (current) use of insulin: Secondary | ICD-10-CM

## 2023-12-27 DIAGNOSIS — M25561 Pain in right knee: Secondary | ICD-10-CM

## 2023-12-27 DIAGNOSIS — G4733 Obstructive sleep apnea (adult) (pediatric): Secondary | ICD-10-CM

## 2023-12-27 DIAGNOSIS — E1165 Type 2 diabetes mellitus with hyperglycemia: Secondary | ICD-10-CM

## 2023-12-27 DIAGNOSIS — R296 Repeated falls: Secondary | ICD-10-CM | POA: Insufficient documentation

## 2023-12-27 DIAGNOSIS — M25562 Pain in left knee: Secondary | ICD-10-CM

## 2023-12-27 DIAGNOSIS — E1159 Type 2 diabetes mellitus with other circulatory complications: Secondary | ICD-10-CM

## 2023-12-27 MED ORDER — VITAMIN D (ERGOCALCIFEROL) 1.25 MG (50000 UNIT) PO CAPS: 50000.0000 [IU] | ORAL_CAPSULE | ORAL | 0 refills | Status: DC

## 2023-12-27 MED ORDER — BD PEN NEEDLE MINI U/F 31G X 5 MM MISC: 3 refills | Status: AC

## 2023-12-27 MED ORDER — OMEPRAZOLE 20 MG PO CPDR: 20.0000 mg | DELAYED_RELEASE_CAPSULE | Freq: Every day | ORAL | 1 refills | Status: DC

## 2023-12-27 MED ORDER — TRESIBA FLEXTOUCH 100 UNIT/ML ~~LOC~~ SOPN: 40.0000 [IU] | PEN_INJECTOR | Freq: Every day | SUBCUTANEOUS | 3 refills | Status: DC

## 2023-12-27 NOTE — Assessment & Plan Note (Signed)
 Patient had a fall and injured her wrist. It is minimally painful today and only mildly tender to palpation dorsally. Perfusion of the hand is good. She has sustained a likely wrist sprain which she was informed will take some time to feel better. It is not bothering her enough to need a brace or wrap today. -instructed to stretch the wrist and ice as needed (no longer than 20 mins with ice not directly on skin)

## 2023-12-27 NOTE — Assessment & Plan Note (Signed)
 Patient has severe OSA diagnosed on sleep study and would benefit from CPAP titration study. Patient concerned about CPAP cost. We informed her we are unsure of how much it would cost but to contact the sleep center as they should have a better idea. -contact sleep center for CPAP titration study and identify machine cost

## 2023-12-27 NOTE — Assessment & Plan Note (Signed)
 Chronic and stable. No recent episodes.  -continue omeprazole 

## 2023-12-27 NOTE — Assessment & Plan Note (Signed)
 Patient has had many recent falls due to feeling unstable on her feet. She does ambulate with a cane. We discussed and feel it would be beneficial for her to be set up with physical therapy. -refer to PT for strengthening

## 2023-12-27 NOTE — Assessment & Plan Note (Signed)
 Stable and chronic. Treated currently in right knee hyaluronic acid injections which have been minimally helpful to her. She has had success in the past with steroid injections. She sees orthopaedics and is planning on following up with them to discuss her knees. -Follow up with orthopaedist

## 2023-12-27 NOTE — Progress Notes (Unsigned)
 Established Patient Office Visit  Subjective   Patient ID: Christine Clarke, female    DOB: 05-May-1951  Age: 73 y.o. MRN: [id number]  Chief Complaint  Patient presents with   Care Management    Patient would like to know if she should be taking something for memory due to age. As well as she would like ears irrigated due to failed attempt at home, to discuss her C-Pap such as the cost, numbness in right leg,    Medication Refill    Vitamin D  was suppose to take once a week but took daily and is now out and would like to know if she should still be taking it   Medication Consultation    She would like to discuss the difference in mounjaro and insulin. She reports she never has reflux so she wants to know should she be taking omeprazole  or should she stop. She would also like to know can the carvedilol  prescription be updated to one tablet a day.      Christine Clarke is a 73 y/o here today for a 3 month follow-up on her diabetes, hypertension and her vitamin D  deficiency. Her diabetes is currently being managed by endocrinology and she has been taking insulin, mounjaro, and glipizide .  She also has many questions that she wants to clarify about her medications/supplements. She additionally would like to have her ears cleaned.    She wanted to discuss her knee and thigh pain for which she has been managed by orthopaedics recently with injection therapy. The hyaluronic acid injections have not helper her much but she has had good relief from corticosteroid injections. She has admitted to having many falls over the past few years; she ambulated with a cane but is still feeling unsteady at time due to her joint and leg pains. Her most recent fall was two weeks ago where she hurt her right wrist.  She has also made mention of a right sided, painful vaginal "twitch" which has occurred infrequently over the past few months which lasts for a few seconds at a time and then abates.  She was diagnosed with  severe sleep apnea following a sleep study and is wondering about the cost for a CPAP machine.  She denies any changes to her bowel or bladder habits, numbness or tingling and denies any unintentional weight loss.  Past Medical History:  Diagnosis Date   Allergy    Diabetes mellitus without complication (HCC)    Hyperlipidemia    Hypertension    Past Surgical History:  Procedure Laterality Date   ABDOMINAL HYSTERECTOMY  1981   partial   BLADDER SURGERY  1981   BREAST EXCISIONAL BIOPSY Left 1998          Objective:     BP 138/75 (BP Location: Left Arm, Patient Position: Sitting, Cuff Size: Large)   Pulse 84   Ht 5' (1.524 m)   LMP  (LMP Unknown)   SpO2 100%   BMI 36.83 kg/m  BP Readings from Last 3 Encounters:  12/27/23 138/75  09/27/23 (!) 173/73  09/27/23 132/80   Wt Readings from Last 3 Encounters:  09/27/23 188 lb 9.6 oz (85.5 kg)  09/27/23 188 lb 9.6 oz (85.5 kg)  06/09/23 186 lb (84.4 kg)      Physical Exam Constitutional:      General: She is not in acute distress.    Appearance: Normal appearance. She is obese.  HENT:     Head: Normocephalic and atraumatic.  Right Ear: Tympanic membrane, ear canal and external ear normal.     Left Ear: Tympanic membrane, ear canal and external ear normal.     Nose: Nose normal.     Mouth/Throat:     Mouth: Mucous membranes are moist.     Pharynx: Oropharynx is clear. No oropharyngeal exudate.  Eyes:     General: No scleral icterus.    Extraocular Movements: Extraocular movements intact.     Conjunctiva/sclera: Conjunctivae normal.     Pupils: Pupils are equal, round, and reactive to light.  Cardiovascular:     Rate and Rhythm: Normal rate and regular rhythm.     Pulses: Normal pulses.     Heart sounds: Normal heart sounds. No murmur heard.    No friction rub. No gallop.  Pulmonary:     Effort: Pulmonary effort is normal. No respiratory distress.     Breath sounds: Normal breath sounds. No wheezing.   Abdominal:     General: Bowel sounds are normal. There is no distension.  Musculoskeletal:        General: Tenderness present. No swelling or deformity.     Cervical back: Normal range of motion. No rigidity.     Comments: Decreased wrist ROM bilaterally and symetrically with mild tenderness to palpation on the dorsal right wrist. No point tenderness or pain with movement.   Skin:    General: Skin is warm and dry.     Coloration: Skin is not jaundiced or pale.     Findings: No erythema or rash.  Neurological:     General: No focal deficit present.     Mental Status: She is alert and oriented to person, place, and time. Mental status is at baseline.     Gait: Gait abnormal.  Psychiatric:        Mood and Affect: Mood normal.        Behavior: Behavior normal.      No results found for any visits on 12/27/23.  Last CBC Lab Results  Component Value Date   WBC 7.8 09/27/2023   HGB 15.0 09/27/2023   HCT 44.8 09/27/2023   MCV 94 09/27/2023   MCH 31.3 09/27/2023   RDW 12.6 09/27/2023   PLT 185 09/27/2023   Last metabolic panel Lab Results  Component Value Date   GLUCOSE 303 (H) 09/27/2023   NA 138 09/27/2023   K 4.4 09/27/2023   CL 101 09/27/2023   CO2 24 09/27/2023   BUN 25 09/27/2023   CREATININE 0.98 09/27/2023   EGFR 61 09/27/2023   CALCIUM 9.7 09/27/2023   PROT 6.0 09/27/2023   ALBUMIN 4.3 09/27/2023   LABGLOB 1.7 09/27/2023   AGRATIO 1.8 02/12/2022   BILITOT 0.5 09/27/2023   ALKPHOS 82 09/27/2023   AST 17 09/27/2023   ALT 16 09/27/2023   Last lipids Lab Results  Component Value Date   CHOL 136 09/27/2023   HDL 37 (L) 09/27/2023   LDLCALC 57 09/27/2023   TRIG 265 (H) 09/27/2023   CHOLHDL 3.7 09/27/2023   Last hemoglobin A1c Lab Results  Component Value Date   HGBA1C 11.1 (H) 09/27/2023   Last thyroid  functions Lab Results  Component Value Date   TSH 4.580 (H) 09/27/2023   T4TOTAL 7.2 02/10/2017   Last vitamin D  Lab Results  Component Value  Date   VD25OH 16.6 (L) 09/27/2023      The 10-year ASCVD risk score (Arnett DK, et al., 2019) is: 40%    Assessment & Plan:  Problem List Items Addressed This Visit       Cardiovascular and Mediastinum   Hypertension associated with diabetes (HCC)   Managed with losartan  50 mg daily. Initial high reading in office; subsequent manual reading was 138/75 mmHg - Continue losartan  50 mg daily - Encourage home blood pressure monitoring -consider increasing dose if measurements are continually high      Relevant Medications   TRESIBA FLEXTOUCH 100 UNIT/ML FlexTouch Pen     Respiratory   OSA (obstructive sleep apnea)   Patient has severe OSA diagnosed on sleep study and would benefit from CPAP titration study. Patient concerned about CPAP cost. We informed her we are unsure of how much it would cost but to contact the sleep center as they should have a better idea. -contact sleep center for CPAP titration study and identify machine cost        Digestive   Gastroesophageal reflux disease without esophagitis   Chronic and stable. No recent episodes.  -continue omeprazole       Relevant Medications   omeprazole  (PRILOSEC) 20 MG capsule     Endocrine   T2DM (type 2 diabetes mellitus) (HCC)   Type 2 Diabetes Mellitus, not well controlled. Her A1c was most recently 11.7. Currently on Tresiba insulin Endocrinology added mounjaro which has helped her sugars to get down to 200 on average this past week. She was also needing a refill of her pen needles and insulin. We informed her we will refill this today but should obtain refills through endocrinology going forward - Ensure continuation of Tresiba insulin as prescribed -Continue Mounjaro -continue to monitor sugars -following up with endocrine in a few weeks       Relevant Medications   TRESIBA FLEXTOUCH 100 UNIT/ML FlexTouch Pen     Musculoskeletal and Integument   Sprain of right wrist   Patient had a fall and injured her  wrist. It is minimally painful today and only mildly tender to palpation dorsally. Perfusion of the hand is good. She has sustained a likely wrist sprain which she was informed will take some time to feel better. It is not bothering her enough to need a brace or wrap today. -instructed to stretch the wrist and ice as needed (no longer than 20 mins with ice not directly on skin)        Other   Vitamin D  deficiency   Patient accidentally took 1 50,000 vitamin D  supplement per day for 12 days back in march and has had none since. She has not had any kidney complaints or other symptoms of toxicities. She needs a refill as she has been out of the supplement for a few months. -Refill sent in with instructions to only take one pill per week and that this prescription should last her 3 months.      Relevant Medications   Vitamin D , Ergocalciferol , (DRISDOL ) 1.25 MG (50000 UNIT) CAPS capsule   Chronic pain of both knees   Stable and chronic. Treated currently in right knee hyaluronic acid injections which have been minimally helpful to her. She has had success in the past with steroid injections. She sees orthopaedics and is planning on following up with them to discuss her knees. -Follow up with orthopaedist       Recurrent falls - Primary   Patient has had many recent falls due to feeling unstable on her feet. She does ambulate with a cane. We discussed and feel it would be beneficial for her to be set up with  physical therapy. -refer to PT for strengthening     - She sees [facility] for [facility] and will call them to make an appointment.  Return in about 6 months (around 06/28/2024) for chronic disease f/u.    Monda Angry, Medical Student

## 2023-12-27 NOTE — Assessment & Plan Note (Signed)
 Managed with losartan  50 mg daily. Initial high reading in office; subsequent manual reading was 138/75 mmHg - Continue losartan  50 mg daily - Encourage home blood pressure monitoring -consider increasing dose if measurements are continually high

## 2023-12-27 NOTE — Assessment & Plan Note (Signed)
 Type 2 Diabetes Mellitus, not well controlled. Her A1c was most recently 11.7. Currently on Tresiba insulin Endocrinology added mounjaro which has helped her sugars to get down to 200 on average this past week. She was also needing a refill of her pen needles and insulin. We informed her we will refill this today but should obtain refills through endocrinology going forward - Ensure continuation of Tresiba insulin as prescribed -Continue Mounjaro -continue to monitor sugars -following up with endocrine in a few weeks

## 2023-12-27 NOTE — Assessment & Plan Note (Signed)
 Patient accidentally took 1 50,000 vitamin D  supplement per day for 12 days back in march and has had none since. She has not had any kidney complaints or other symptoms of toxicities. She needs a refill as she has been out of the supplement for a few months. -Refill sent in with instructions to only take one pill per week and that this prescription should last her 3 months.

## 2024-01-10 ENCOUNTER — Ambulatory Visit: Attending: Sleep Medicine

## 2024-01-10 ENCOUNTER — Other Ambulatory Visit (HOSPITAL_COMMUNITY): Payer: Self-pay

## 2024-01-10 DIAGNOSIS — G4761 Periodic limb movement disorder: Secondary | ICD-10-CM | POA: Diagnosis not present

## 2024-01-10 DIAGNOSIS — G4733 Obstructive sleep apnea (adult) (pediatric): Secondary | ICD-10-CM | POA: Diagnosis present

## 2024-01-13 ENCOUNTER — Other Ambulatory Visit (HOSPITAL_COMMUNITY): Payer: Self-pay

## 2024-01-13 ENCOUNTER — Telehealth: Payer: Self-pay | Admitting: Pharmacy Technician

## 2024-01-13 NOTE — Telephone Encounter (Signed)
 Pharmacy Patient Advocate Encounter  Received notification from [employer] that Prior Authorization for Mounjaro  2.5MG /0.5ML auto-injectors has been APPROVED from 09/27/2023 to 13/31/2025. Ran test claim, Copay is $47.00. This test claim was processed through [facility]- copay amounts may vary at other pharmacies due to pharmacy/plan contracts, or as the patient moves through the different stages of their insurance plan.   PA #/Case ID/Reference #: Key: [id number]

## 2024-01-16 ENCOUNTER — Telehealth: Payer: Self-pay | Admitting: Family Medicine

## 2024-01-16 ENCOUNTER — Other Ambulatory Visit: Payer: Self-pay

## 2024-01-16 MED ORDER — TIRZEPATIDE 2.5 MG/0.5ML ~~LOC~~ SOAJ: 2.5000 mg | SUBCUTANEOUS | 0 refills | Status: AC

## 2024-01-16 NOTE — Telephone Encounter (Signed)
[facility] pharmacy faxed refill request for the following medications:   tirzepatide [facility]) 2.5 MG/0.5ML Pen    Please advise

## 2024-01-17 ENCOUNTER — Ambulatory Visit: Payer: Self-pay | Admitting: Family Medicine

## 2024-01-19 NOTE — Telephone Encounter (Signed)
 Patient has notified via sms with a link to the order tracker. Link listed below  [email or web address]

## 2024-01-20 ENCOUNTER — Other Ambulatory Visit: Payer: Self-pay | Admitting: Family Medicine

## 2024-01-20 DIAGNOSIS — E034 Atrophy of thyroid (acquired): Secondary | ICD-10-CM

## 2024-01-20 DIAGNOSIS — G4733 Obstructive sleep apnea (adult) (pediatric): Secondary | ICD-10-CM | POA: Diagnosis not present

## 2024-01-21 ENCOUNTER — Other Ambulatory Visit: Payer: Self-pay | Admitting: Physician Assistant

## 2024-01-21 DIAGNOSIS — E1165 Type 2 diabetes mellitus with hyperglycemia: Secondary | ICD-10-CM

## 2024-01-26 NOTE — Telephone Encounter (Addendum)
 Per [place]  [facility] 6/23 ? 4:27PM Scheduling Delivery: Order for CPAP Machine, Generic + 10 other items is in the final stages of being processed

## 2024-02-09 ENCOUNTER — Other Ambulatory Visit: Payer: Self-pay | Admitting: Family Medicine

## 2024-02-13 ENCOUNTER — Ambulatory Visit
Admission: RE | Admit: 2024-02-13 | Discharge: 2024-02-13 | Disposition: A | Discharge status: Home / Self Care | Source: Ambulatory Visit | Attending: Family Medicine | Admitting: Family Medicine

## 2024-02-13 ENCOUNTER — Ambulatory Visit
Admission: RE | Admit: 2024-02-13 | Discharge: 2024-02-13 | Disposition: A | Discharge status: Home / Self Care | Source: Ambulatory Visit | Attending: Family Medicine

## 2024-02-13 DIAGNOSIS — Z1231 Encounter for screening mammogram for malignant neoplasm of breast: Secondary | ICD-10-CM | POA: Insufficient documentation

## 2024-02-13 DIAGNOSIS — Z780 Asymptomatic menopausal state: Secondary | ICD-10-CM | POA: Insufficient documentation

## 2024-02-15 ENCOUNTER — Ambulatory Visit: Payer: Self-pay | Admitting: Family Medicine

## 2024-02-16 ENCOUNTER — Other Ambulatory Visit: Payer: Self-pay | Admitting: Family Medicine

## 2024-02-16 DIAGNOSIS — R928 Other abnormal and inconclusive findings on diagnostic imaging of breast: Secondary | ICD-10-CM

## 2024-02-16 DIAGNOSIS — N6314 Unspecified lump in the right breast, lower inner quadrant: Secondary | ICD-10-CM

## 2024-02-21 ENCOUNTER — Telehealth: Payer: Self-pay

## 2024-02-21 ENCOUNTER — Ambulatory Visit
Admission: RE | Admit: 2024-02-21 | Discharge: 2024-02-21 | Disposition: A | Discharge status: Home / Self Care | Source: Ambulatory Visit | Attending: Family Medicine | Admitting: Family Medicine

## 2024-02-21 ENCOUNTER — Ambulatory Visit
Admission: RE | Admit: 2024-02-21 | Discharge: 2024-02-21 | Disposition: A | Discharge status: Home / Self Care | Source: Ambulatory Visit | Attending: Family Medicine

## 2024-02-21 DIAGNOSIS — R928 Other abnormal and inconclusive findings on diagnostic imaging of breast: Secondary | ICD-10-CM | POA: Diagnosis present

## 2024-02-21 DIAGNOSIS — N6314 Unspecified lump in the right breast, lower inner quadrant: Secondary | ICD-10-CM | POA: Insufficient documentation

## 2024-02-21 NOTE — Telephone Encounter (Signed)
 Copied from CRM [id number]. Topic: Appointments - Appointment Scheduling >> Feb 21, 2024 10:43 AM Marylynn H wrote: Patient states she believes she had a mini stroke this past Friday (lasted about 2 minutes), would not provide any symptoms that she was having or the reason she felt that she had one. States Dr. B is aware of her history. Patient declined speaking to NT as well, states she is not going to the ER. Only wants to speak with Dr. KATHEE, no appointments available till August. Please advise # [phone] [facility] and will be tied up for the next hour and a half.

## 2024-02-22 ENCOUNTER — Ambulatory Visit: Payer: Self-pay

## 2024-02-22 NOTE — Telephone Encounter (Signed)
 FYI Only or Action Required?: Action required by provider: request for appointment.  Patient was last seen in primary care on 12/27/2023 by Myrla Jon HERO, MD.  Called Nurse Triage reporting No chief complaint on file..  Symptoms began yesterday - resolved completely after 2 minutes.  Interventions attempted: Nothing.  Symptoms are: completely resolved.  Triage Disposition: Discuss With PCP and Callback by Nurse Today  Patient/caregiver understands and will follow disposition?: Yes  Answer Assessment - Initial Assessment Questions Patient calling back requesting PCP contact her about her call yesterday. Denies any current symptoms. States she had difficulty speaking for approximately one minute on 02/17/24. Denies any current symptoms. Scheduled for soonest available office visit and message routed to PCP office. ED precautions reviewed, pt verbalized understanding. Patient reports she has endo appt tomorrow, encouraged to also make that provider aware.   1. SYMPTOM: What is the main symptom you are concerned about? (e.g., weakness, numbness)     RESOLVED - dysphagia at pharmacy x 2 minutes on 02/17/24  2. ONSET: When did this start? (e.g., minutes, hours, days; while sleeping)     02/17/24, lasting 2 minutes  6. NEUROLOGIC SYMPTOMS: Have you had any of the following symptoms: headache, dizziness, vision loss, double vision, changes in speech, unsteady on your feet?     Denies  7. OTHER SYMPTOMS: Do you have any other symptoms?     Denies  Protocols used: Neurologic [facility] Copied from CRM #[id number]. Topic: Clinical - Red Word Triage >> Feb 22, 2024  4:06 PM Christine Clarke wrote: [facility] that prompted transfer to Nurse Triage: pt called in , concerned she may have had mini stroke last Friday, she tried to speak with pharmacist and couldn't get the words out

## 2024-02-23 NOTE — Telephone Encounter (Signed)
 Thanks. Does anyone else in office have an acute open sooner?  If symptoms recur, needs to seek emergent care

## 2024-02-23 NOTE — Telephone Encounter (Signed)
 Called patient, states she would only like to see you. Would like to keep appt on 7/31. Will keep an eye out for a sooner appt. Patient states she will be seeing endocrinology today as well.

## 2024-02-23 NOTE — Telephone Encounter (Signed)
 I see she was given an appt with me 7/31.  Nothing to do now as we are 1 week later and she is not reporting any symtpoms now. If symptoms recur, she should seek care emergently.

## 2024-02-23 NOTE — Telephone Encounter (Signed)
 Noted

## 2024-02-23 NOTE — Telephone Encounter (Signed)
 Patient advised. States she still wants to come in to see Dr.B.

## 2024-02-29 ENCOUNTER — Other Ambulatory Visit: Payer: Self-pay | Admitting: Family Medicine

## 2024-03-01 ENCOUNTER — Ambulatory Visit: Admitting: Family Medicine

## 2024-03-01 ENCOUNTER — Encounter: Payer: Self-pay | Admitting: Family Medicine

## 2024-03-01 VITALS — BP 125/84 | HR 89 | Temp 97.3°F | Ht 60.0 in | Wt 182.4 lb

## 2024-03-01 DIAGNOSIS — I152 Hypertension secondary to endocrine disorders: Secondary | ICD-10-CM

## 2024-03-01 DIAGNOSIS — E1165 Type 2 diabetes mellitus with hyperglycemia: Secondary | ICD-10-CM | POA: Diagnosis not present

## 2024-03-01 DIAGNOSIS — E1169 Type 2 diabetes mellitus with other specified complication: Secondary | ICD-10-CM

## 2024-03-01 DIAGNOSIS — E1159 Type 2 diabetes mellitus with other circulatory complications: Secondary | ICD-10-CM

## 2024-03-01 DIAGNOSIS — E785 Hyperlipidemia, unspecified: Secondary | ICD-10-CM

## 2024-03-01 DIAGNOSIS — Z5189 Encounter for other specified aftercare: Secondary | ICD-10-CM

## 2024-03-01 DIAGNOSIS — G459 Transient cerebral ischemic attack, unspecified: Secondary | ICD-10-CM | POA: Diagnosis not present

## 2024-03-01 NOTE — Assessment & Plan Note (Signed)
 Currently managed with medication regimen. Recently managed at last appointment - Continue current medication regimen - Discussed importance of lipid control to help reduce stroke risk

## 2024-03-01 NOTE — Assessment & Plan Note (Signed)
 Currently not well controlled. Most recent A1C was in the 9s. Managed by endocrinology to help optimize medication regimen. - Continue to follow up with endocrinology for management - Discussed importance of diabetes control to help lower stroke risk

## 2024-03-01 NOTE — Progress Notes (Unsigned)
 Acute Office Visit  Subjective:     Patient ID: Christine Clarke, female    DOB: [birth year], 73 y.o.   MRN: [id number]  Chief Complaint  Patient presents with   Dysarthria    Had an episode about 2 weeks ago at the pharmacy where she had trouble speaking- lasted less than 2 minutes. States she was trying to tell pharmacy tech her name and address, was able to speak but did not make sense what she was saying.    Medication Problem    Would like to know how she is to be taking carvedilol , states she currently takes at lunch and one in the evening (2 per day)    Christine Clarke is a 73 yo F with a history of HTN, T2DM, and HLD who presents acutely to the clinic today for evaluation of a recent episode of dysarthria.  On interview, she states that two weeks ago she experienced an episode of dysarthria while waiting to get a prescription filled. She reports that during the two minute episode she could not convey her name, phone number or address to the pharmacist. She states that the pharmacist was concerned for her. After the two minute episode, she states that she felt like normal, picked up her prescription, and drove home. She denied any loss of consciousness, confusion after, changes in vision or hearing, or any symptoms of a seizure. She denies any prior history of such episodes or any strokes. She endorses feeling at baseline in terms of her neurocognitive abilities, sensation, and strength. She reports no other concerns today.    ROS      Objective:    BP 125/84 (BP Location: Left Arm, Patient Position: Sitting, Cuff Size: Normal)   Pulse 89   Temp (!) 97.3 F (36.3 C) (Oral)   Ht 5' (1.524 m)   Wt 182 lb 6.4 oz (82.7 kg)   LMP  (LMP Unknown)   SpO2 95%   BMI 35.62 kg/m  {Vitals History (Optional):23777}  Physical Exam Vitals reviewed.  Constitutional:      General: She is not in acute distress.    Appearance: Normal appearance. She is not ill-appearing or diaphoretic.   HENT:     Head: Normocephalic.     Right Ear: Tympanic membrane, ear canal and external ear normal.     Left Ear: Tympanic membrane, ear canal and external ear normal.     Nose: Nose normal.     Mouth/Throat:     Mouth: Mucous membranes are moist.     Pharynx: Oropharynx is clear. No oropharyngeal exudate or posterior oropharyngeal erythema.  Eyes:     General: No scleral icterus.    Conjunctiva/sclera: Conjunctivae normal.     Pupils: Pupils are equal, round, and reactive to light.  Cardiovascular:     Rate and Rhythm: Normal rate and regular rhythm.     Pulses: Normal pulses.     Heart sounds: Normal heart sounds. No murmur heard.    No friction rub. No gallop.  Pulmonary:     Effort: Pulmonary effort is normal. No respiratory distress.     Breath sounds: Normal breath sounds. No wheezing.  Abdominal:     General: There is no distension.     Palpations: Abdomen is soft.     Tenderness: There is no abdominal tenderness. There is no guarding.  Musculoskeletal:     Cervical back: Normal range of motion.     Right lower leg: No edema.  Left lower leg: No edema.  Lymphadenopathy:     Cervical: No cervical adenopathy.  Skin:    General: Skin is warm and dry.     Capillary Refill: Capillary refill takes less than 2 seconds.  Neurological:     General: No focal deficit present.     Mental Status: She is alert and oriented to person, place, and time.  Psychiatric:        Mood and Affect: Mood normal.     No results found for any visits on 03/01/24.      Assessment & Plan:   Problem List Items Addressed This Visit       Cardiovascular and Mediastinum   Hypertension associated with diabetes (HCC)   Currently well controlled on medication regimen. Recently seen with medication adjustment. Had questions about metoprolol dosage. - Continue current medication regimen. - Discussed taking metoprolol twice daily because of short medication half-life - Discussed good  management of HTN for lower stroke risk in the future         Endocrine   T2DM (type 2 diabetes mellitus) (HCC)   Currently not well controlled. Most recent A1C was in the 9s. Managed by endocrinology to help optimize medication regimen. - Continue to follow up with endocrinology for management - Discussed importance of diabetes control to help lower stroke risk      Hyperlipidemia associated with type 2 diabetes mellitus (HCC)   Currently managed with medication regimen. Recently managed at last appointment - Continue current medication regimen - Discussed importance of lipid control to help reduce stroke risk      Other Visit Diagnoses       TIA (transient ischemic attack)    -  Primary     Encounter for medication adjustment          TIA Experienced TIA two weeks ago with a 2 minute episode of dysarthria. Reports full recovery with no obvious focal neurologic deficit on exam today. Patient expresses concern about future stroke risk. - Discussed red flag symptoms to present to ER - Discussed management of future stroke risk with HTN, T2DM, and HLD control - Start aspirin 81 mg daily to help lower stroke risk  Encounter for medication adjustment Patient presented with question about when to take carvedilol  during the day. - Discussed the importance of taking carvedilol  twice a day in the afternoon and evening to help cover the medicine's short half-life.  No orders of the defined types were placed in this encounter.   Return in about 7 months (around 09/29/2024) for CPE.  Elia LULLA Blanch, Medical Student

## 2024-03-01 NOTE — Assessment & Plan Note (Signed)
 Currently well controlled on medication regimen. Recently seen with medication adjustment. Had questions about metoprolol dosage. - Continue current medication regimen. - Discussed taking metoprolol twice daily because of short medication half-life - Discussed good management of HTN for lower stroke risk in the future

## 2024-03-10 DIAGNOSIS — G4733 Obstructive sleep apnea (adult) (pediatric): Secondary | ICD-10-CM | POA: Diagnosis not present

## 2024-03-28 ENCOUNTER — Other Ambulatory Visit: Payer: Self-pay | Admitting: Family Medicine

## 2024-03-28 DIAGNOSIS — J309 Allergic rhinitis, unspecified: Secondary | ICD-10-CM

## 2024-03-28 DIAGNOSIS — E559 Vitamin D deficiency, unspecified: Secondary | ICD-10-CM

## 2024-03-28 DIAGNOSIS — E034 Atrophy of thyroid (acquired): Secondary | ICD-10-CM

## 2024-04-09 ENCOUNTER — Telehealth (INDEPENDENT_AMBULATORY_CARE_PROVIDER_SITE_OTHER): Payer: Self-pay | Admitting: Sleep Medicine

## 2024-04-09 NOTE — Telephone Encounter (Signed)
 Sleep study results forwarded to ordering provider.

## 2024-04-10 DIAGNOSIS — G4733 Obstructive sleep apnea (adult) (pediatric): Secondary | ICD-10-CM | POA: Diagnosis not present

## 2024-04-12 DIAGNOSIS — E785 Hyperlipidemia, unspecified: Secondary | ICD-10-CM | POA: Diagnosis not present

## 2024-04-12 DIAGNOSIS — I152 Hypertension secondary to endocrine disorders: Secondary | ICD-10-CM | POA: Diagnosis not present

## 2024-04-12 DIAGNOSIS — E1169 Type 2 diabetes mellitus with other specified complication: Secondary | ICD-10-CM | POA: Diagnosis not present

## 2024-04-12 DIAGNOSIS — E669 Obesity, unspecified: Secondary | ICD-10-CM | POA: Diagnosis not present

## 2024-04-12 DIAGNOSIS — Z794 Long term (current) use of insulin: Secondary | ICD-10-CM | POA: Diagnosis not present

## 2024-04-12 DIAGNOSIS — E11649 Type 2 diabetes mellitus with hypoglycemia without coma: Secondary | ICD-10-CM | POA: Diagnosis not present

## 2024-04-12 DIAGNOSIS — E1159 Type 2 diabetes mellitus with other circulatory complications: Secondary | ICD-10-CM | POA: Diagnosis not present

## 2024-04-23 DIAGNOSIS — M1712 Unilateral primary osteoarthritis, left knee: Secondary | ICD-10-CM | POA: Diagnosis not present

## 2024-04-30 NOTE — Progress Notes (Signed)
 Christine Clarke                                          MRN: [id number]   04/30/2024   The [facility] Quality Team Specialist reviewed this patient medical record for the purposes of chart review for care gap closure. The following were reviewed: chart review for care gap closure-kidney health evaluation for diabetes:eGFR  and uACR.    [facility] Quality Team

## 2024-05-07 NOTE — Progress Notes (Signed)
 Christine Clarke                                          MRN: [id number]   05/07/2024   The [facility] Quality Team Specialist reviewed this patient medical record for the purposes of chart review for care gap closure. The following were reviewed: abstraction for care gap closure-glycemic status assessment.    [facility] Quality Team

## 2024-05-10 ENCOUNTER — Other Ambulatory Visit: Payer: Self-pay | Admitting: Family Medicine

## 2024-05-10 DIAGNOSIS — G4733 Obstructive sleep apnea (adult) (pediatric): Secondary | ICD-10-CM | POA: Diagnosis not present

## 2024-05-10 DIAGNOSIS — E1169 Type 2 diabetes mellitus with other specified complication: Secondary | ICD-10-CM

## 2024-06-18 ENCOUNTER — Telehealth: Payer: Self-pay | Admitting: Family Medicine

## 2024-06-18 NOTE — Telephone Encounter (Signed)
 [facility] faxed refill request for the following medications:   TRESIBA  FLEXTOUCH 100 UNIT/ML FlexTouch Pen     Please advise.

## 2024-06-19 ENCOUNTER — Other Ambulatory Visit: Payer: Self-pay

## 2024-06-19 MED ORDER — TRESIBA FLEXTOUCH 100 UNIT/ML ~~LOC~~ SOPN: 40.0000 [IU] | PEN_INJECTOR | Freq: Every day | SUBCUTANEOUS | 3 refills | Status: AC

## 2024-06-19 NOTE — Telephone Encounter (Signed)
Patient due for refill

## 2024-06-21 ENCOUNTER — Other Ambulatory Visit: Payer: Self-pay | Admitting: Family Medicine

## 2024-06-21 DIAGNOSIS — E034 Atrophy of thyroid (acquired): Secondary | ICD-10-CM

## 2024-06-21 DIAGNOSIS — E559 Vitamin D deficiency, unspecified: Secondary | ICD-10-CM

## 2024-06-27 MED ORDER — LEVOTHYROXINE SODIUM 50 MCG PO TABS: 50.0000 ug | ORAL_TABLET | Freq: Every day | ORAL | 0 refills | Status: AC

## 2024-06-27 NOTE — Telephone Encounter (Signed)
 Patient needs to have vitamin D  level and TSH level updated.  Orders were placed , she can drop anytime by the lab and proceed with labs . Meanwhile, she can take over-the-counter vitamin D3 up to 2000 units and I do courtesy refill for levothyroxine  50 mcg for 90 days

## 2024-07-02 ENCOUNTER — Telehealth: Payer: Self-pay | Admitting: Family Medicine

## 2024-07-02 DIAGNOSIS — K219 Gastro-esophageal reflux disease without esophagitis: Secondary | ICD-10-CM

## 2024-07-02 MED ORDER — OMEPRAZOLE 20 MG PO CPDR: 20.0000 mg | DELAYED_RELEASE_CAPSULE | Freq: Every day | ORAL | 0 refills | Status: AC

## 2024-07-02 NOTE — Telephone Encounter (Signed)
[facility] faxed refill request for the following medications:  omeprazole (PRILOSEC) 20 MG capsule     Please advise.  

## 2024-07-02 NOTE — Telephone Encounter (Signed)
 Converted to rx request

## 2024-08-06 ENCOUNTER — Ambulatory Visit: Payer: Self-pay

## 2024-08-06 ENCOUNTER — Encounter: Payer: Self-pay | Admitting: Family Medicine

## 2024-08-06 DIAGNOSIS — B9689 Other specified bacterial agents as the cause of diseases classified elsewhere: Secondary | ICD-10-CM | POA: Diagnosis not present

## 2024-08-06 DIAGNOSIS — J0140 Acute pansinusitis, unspecified: Secondary | ICD-10-CM

## 2024-08-06 MED ORDER — AMOXICILLIN-POT CLAVULANATE 875-125 MG PO TABS: 1.0000 | ORAL_TABLET | Freq: Two times a day (BID) | ORAL | 0 refills | Status: AC

## 2024-08-06 NOTE — Telephone Encounter (Signed)
" °  FYI Only or Action Required?: Action required by provider: request for appointment.  Patient was last seen in primary care on 03/01/2024 by Myrla Jon HERO, MD.  Called Nurse Triage reporting Sinusitis.  Symptoms began 2 weeks ago.  Interventions attempted: OTC medications: Tylenol  and Rest, hydration, or home remedies.  Symptoms are: gradually worsening.  Triage Disposition: See Physician Within 24 Hours  Patient/caregiver understands and will follow disposition?: No, wishes to speak with PCP    Copied from CRM #[id number]. Topic: Clinical - Red Word Triage >> Aug 06, 2024  9:15 AM Christine Clarke wrote: Red Word that prompted transfer to Nurse Triage: sinus infection teeth pain/ constant running nose. Reason for Disposition  Earache  Answer Assessment - Initial Assessment Questions 1. LOCATION: Where does it hurt?      teeth 2. ONSET: When did the sinus pain start?  (e.g., hours, days)      Fever weeks 3. SEVERITY: How bad is the pain?   (Scale 0-10; or none, mild, moderate or severe)     moderate 4. RECURRENT SYMPTOM: Have you ever had sinus problems before? If Yes, ask: When was the last time? and What happened that time?      *No Answer* 5. NASAL CONGESTION: Is the nose blocked? If Yes, ask: Can you open it or must you breathe through your mouth?     *No Answer* 6. NASAL DISCHARGE: Do you have discharge from your nose? If so ask, What color?     clear 7. FEVER: Do you have a fever? If Yes, ask: What is it, how was it measured, and when did it start?      no 8. OTHER SYMPTOMS: Do you have any other symptoms? (e.g., sore throat, cough, earache, difficulty breathing)     Denies cough 9. PREGNANCY: Is there any chance you are pregnant? When was your last menstrual period?     *No Answer*  Protocols used: Sinus Pain or Congestion-A-AH  "

## 2024-08-06 NOTE — Telephone Encounter (Signed)
 I'm ok with double booking a virtual into the first or last slot of the afternoon today for this acute problem - Can someone get it scheduled with the patient please?

## 2024-08-06 NOTE — Progress Notes (Signed)
" ° ° °  MyChart Video Visit    Virtual Visit via Video Note   This format is felt to be most appropriate for this patient at this time. Physical exam was limited by quality of the video and audio technology used for the visit.    Patient location: home Provider location: [facility] Persons involved in the visit: patient, provider  I discussed the limitations of evaluation and management by telemedicine and the availability of in person appointments. The patient expressed understanding and agreed to proceed.  Patient: Christine Clarke   DOB: 20-Oct-1950   74 y.o. Female  MRN: [id number] Visit Date: 08/06/2024  Today's healthcare provider: Jon Eva, MD   Chief Complaint  Patient presents with   Sinusitis   Subjective    Sinusitis     Discussed the use of AI scribe software for clinical note transcription with the patient, who gave verbal consent to proceed.  History of Present Illness   Christine Clarke is a 74 year old female with diabetes who presents with sinus infection symptoms.  She has had about two weeks of sinus pressure and congestion above her upper teeth on both sides and around the nose creases. OTC sinus and allergy medications and Tylenol  have not relieved her symptoms.       Review of Systems      Objective    LMP  (LMP Unknown)       Physical Exam Constitutional:      General: She is not in acute distress.    Appearance: Normal appearance.  HENT:     Head: Normocephalic.  Pulmonary:     Effort: Pulmonary effort is normal. No respiratory distress.  Neurological:     Mental Status: She is alert and oriented to person, place, and time. Mental status is at baseline.        Assessment & Plan     Problem List Items Addressed This Visit   None Visit Diagnoses       Acute non-recurrent pansinusitis    -  Primary   Relevant Medications   amoxicillin -clavulanate (AUGMENTIN ) 875-125 MG tablet           Acute  pansinusitis Bilateral sinus pressure and congestion for approximately two weeks, primarily above the maxillary teeth and around the nose. Symptoms have persisted despite over-the-counter medications and acetaminophen , indicating a need for antibiotic intervention. - Prescribed Augmentin  twice daily for one week with food to protect the stomach. - Sent prescription to [facility] in [place].       Meds ordered this encounter  Medications   amoxicillin -clavulanate (AUGMENTIN ) 875-125 MG tablet    Sig: Take 1 tablet by mouth 2 (two) times daily for 7 days.    Dispense:  14 tablet    Refill:  0     Return if symptoms worsen or fail to improve.     I discussed the assessment and treatment plan with the patient. The patient was provided an opportunity to ask questions and all were answered. The patient agreed with the plan and demonstrated an understanding of the instructions.   The patient was advised to call back or seek an in-person evaluation if the symptoms worsen or if the condition fails to improve as anticipated.   Jon Eva, MD [facility] [phone] (phone) [phone] (fax)  [facility]   "

## 2024-08-06 NOTE — Telephone Encounter (Signed)
"  Appt scheduled  "

## 2024-08-12 ENCOUNTER — Other Ambulatory Visit: Payer: Self-pay | Admitting: Family Medicine

## 2024-08-13 ENCOUNTER — Other Ambulatory Visit: Payer: Self-pay

## 2024-08-13 ENCOUNTER — Telehealth: Payer: Self-pay | Admitting: Family Medicine

## 2024-08-13 DIAGNOSIS — J301 Allergic rhinitis due to pollen: Secondary | ICD-10-CM

## 2024-08-13 MED ORDER — MONTELUKAST SODIUM 10 MG PO TABS: 10.0000 mg | ORAL_TABLET | Freq: Every day | ORAL | 3 refills | Status: AC

## 2024-08-13 NOTE — Telephone Encounter (Signed)
 Refill Request   Pharmacy: [facility]  Medication: montelukast  (SINGULAIR ) 10 MG tablet   Quantity (if provided): 90  Please send in refill request.

## 2024-10-02 ENCOUNTER — Ambulatory Visit: Admitting: Family Medicine

## 2024-10-02 ENCOUNTER — Ambulatory Visit: Payer: Medicare PPO
